# Patient Record
Sex: Female | Born: 1945
Health system: Southern US, Community
[De-identification: ages and names within clinical notes are randomized; demographics above are authoritative.]

## PROBLEM LIST (undated history)

## (undated) DIAGNOSIS — E7800 Pure hypercholesterolemia, unspecified: Secondary | ICD-10-CM

## (undated) DIAGNOSIS — M1990 Unspecified osteoarthritis, unspecified site: Secondary | ICD-10-CM

## (undated) DIAGNOSIS — M810 Age-related osteoporosis without current pathological fracture: Secondary | ICD-10-CM

---

## 2015-02-01 DIAGNOSIS — M898X9 Other specified disorders of bone, unspecified site: Secondary | ICD-10-CM | POA: Diagnosis not present

## 2015-04-04 DIAGNOSIS — H31011 Macula scars of posterior pole (postinflammatory) (post-traumatic), right eye: Secondary | ICD-10-CM | POA: Diagnosis not present

## 2015-04-04 DIAGNOSIS — H04129 Dry eye syndrome of unspecified lacrimal gland: Secondary | ICD-10-CM | POA: Diagnosis not present

## 2015-04-18 DIAGNOSIS — L309 Dermatitis, unspecified: Secondary | ICD-10-CM | POA: Diagnosis not present

## 2015-05-04 DIAGNOSIS — M1712 Unilateral primary osteoarthritis, left knee: Secondary | ICD-10-CM | POA: Diagnosis not present

## 2015-10-03 DIAGNOSIS — Z23 Encounter for immunization: Secondary | ICD-10-CM | POA: Diagnosis not present

## 2015-12-22 DIAGNOSIS — Z0000 Encounter for general adult medical examination without abnormal findings: Secondary | ICD-10-CM | POA: Diagnosis not present

## 2015-12-22 DIAGNOSIS — Z23 Encounter for immunization: Secondary | ICD-10-CM | POA: Diagnosis not present

## 2015-12-22 DIAGNOSIS — M179 Osteoarthritis of knee, unspecified: Secondary | ICD-10-CM | POA: Diagnosis not present

## 2015-12-22 DIAGNOSIS — M859 Disorder of bone density and structure, unspecified: Secondary | ICD-10-CM | POA: Diagnosis not present

## 2015-12-25 ENCOUNTER — Other Ambulatory Visit: Payer: Self-pay

## 2015-12-25 DIAGNOSIS — Z1231 Encounter for screening mammogram for malignant neoplasm of breast: Secondary | ICD-10-CM

## 2016-01-10 ENCOUNTER — Ambulatory Visit
Admission: RE | Admit: 2016-01-10 | Discharge: 2016-01-10 | Disposition: A | Discharge status: Home / Self Care | Payer: Medicare Other | Source: Ambulatory Visit

## 2016-01-10 DIAGNOSIS — Z1231 Encounter for screening mammogram for malignant neoplasm of breast: Secondary | ICD-10-CM | POA: Diagnosis not present

## 2016-01-10 IMAGING — MG MM SCREENING BREAST TOMO BILATERAL
6 of 10 series · 6 of 26 positions shown · non-contrast
Comparison: None.

CLINICAL DATA: Screening.

EXAM:
DIGITAL SCREENING BILATERAL MAMMOGRAM WITH 3D TOMO WITH CAD

[R MLO (1 of 2)]
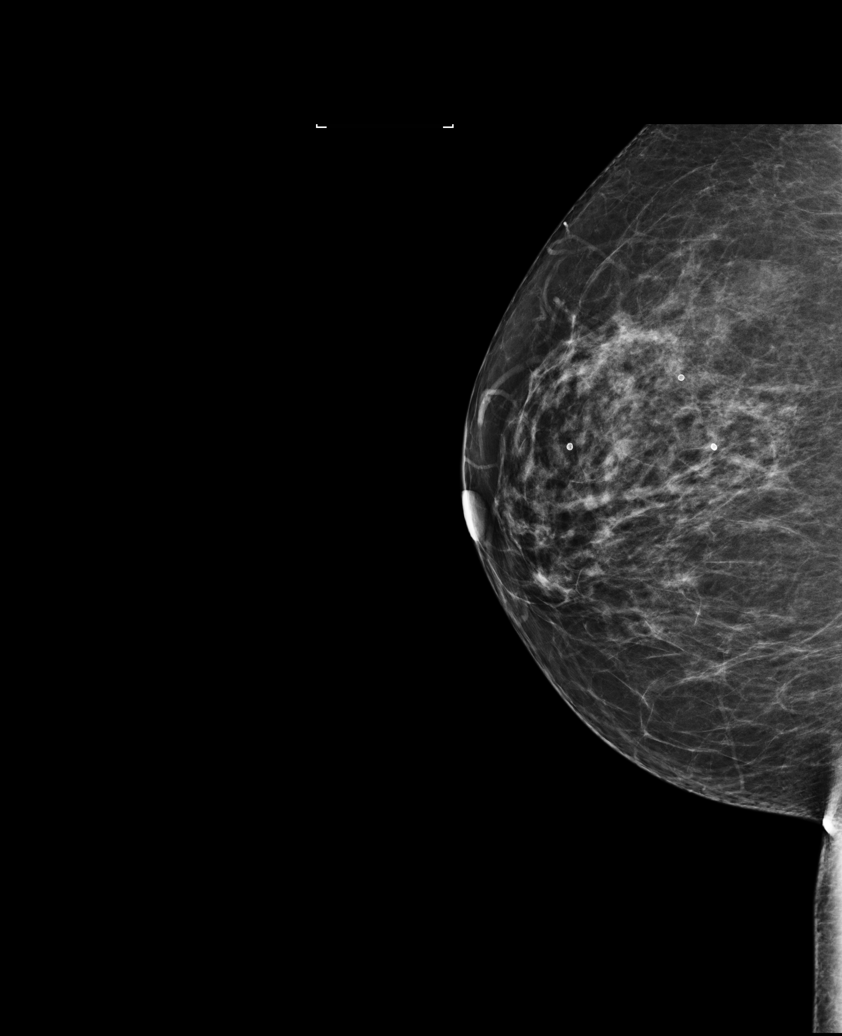

[L MLO (1 of 2)]
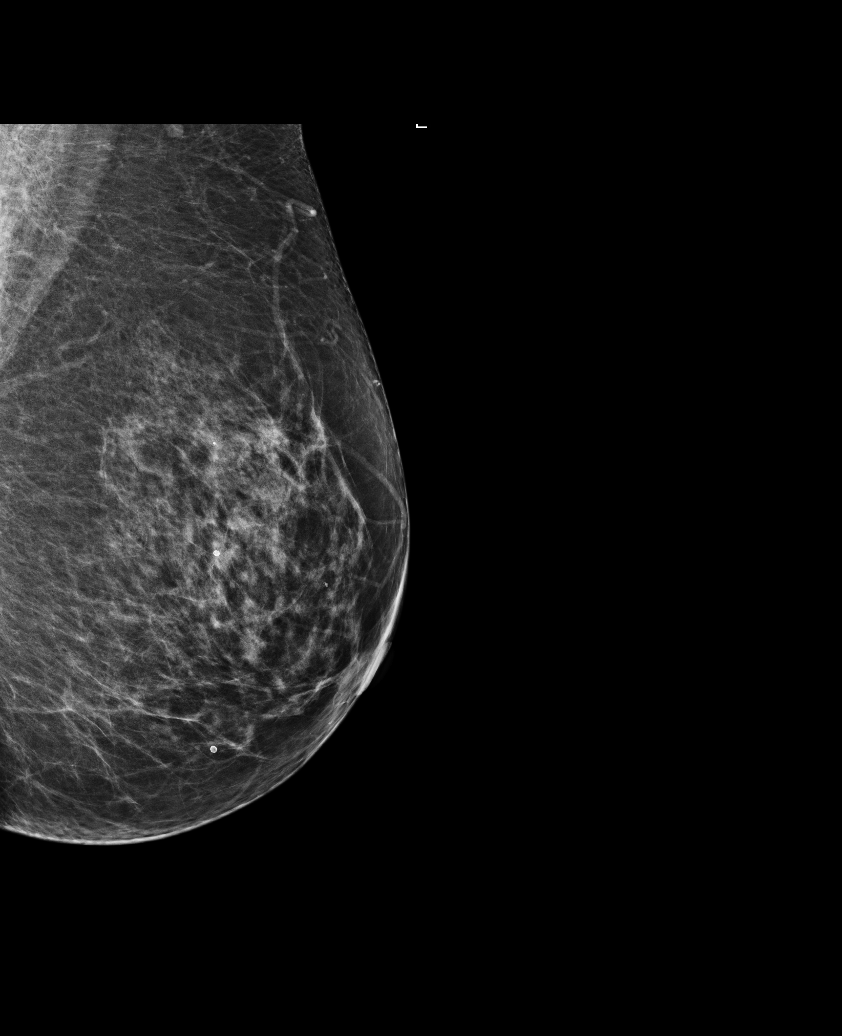

[R MLO (2 of 2)]
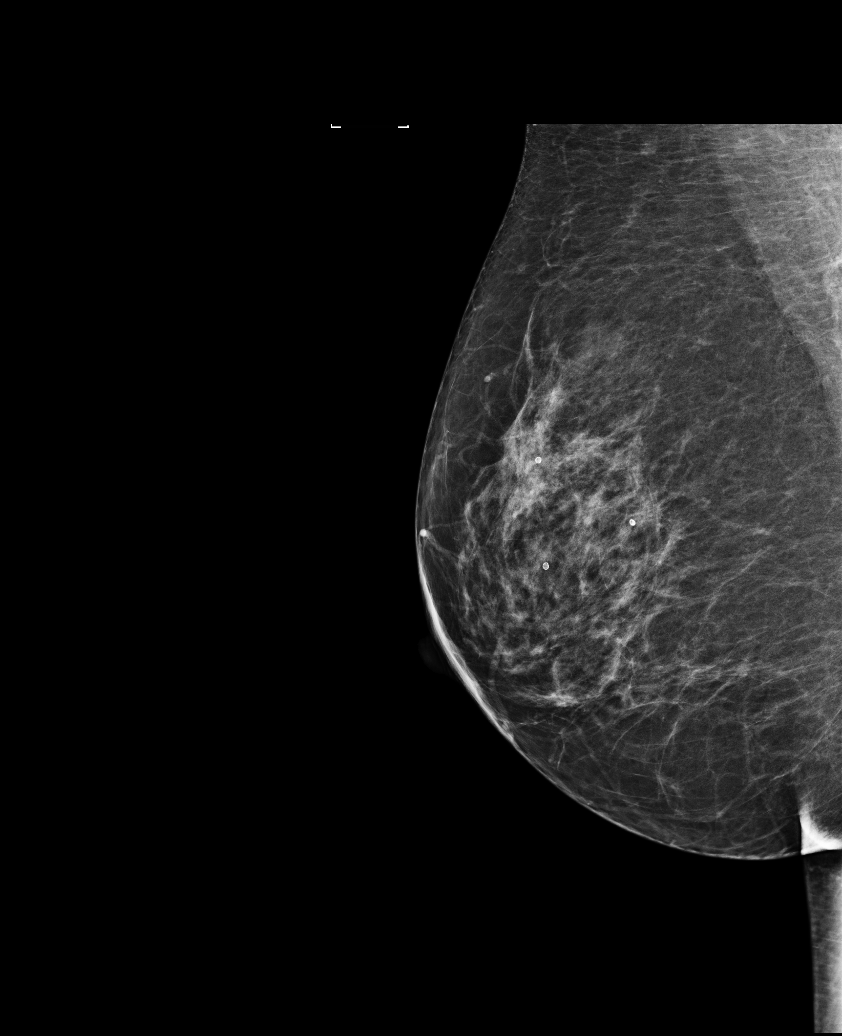

[R CC]
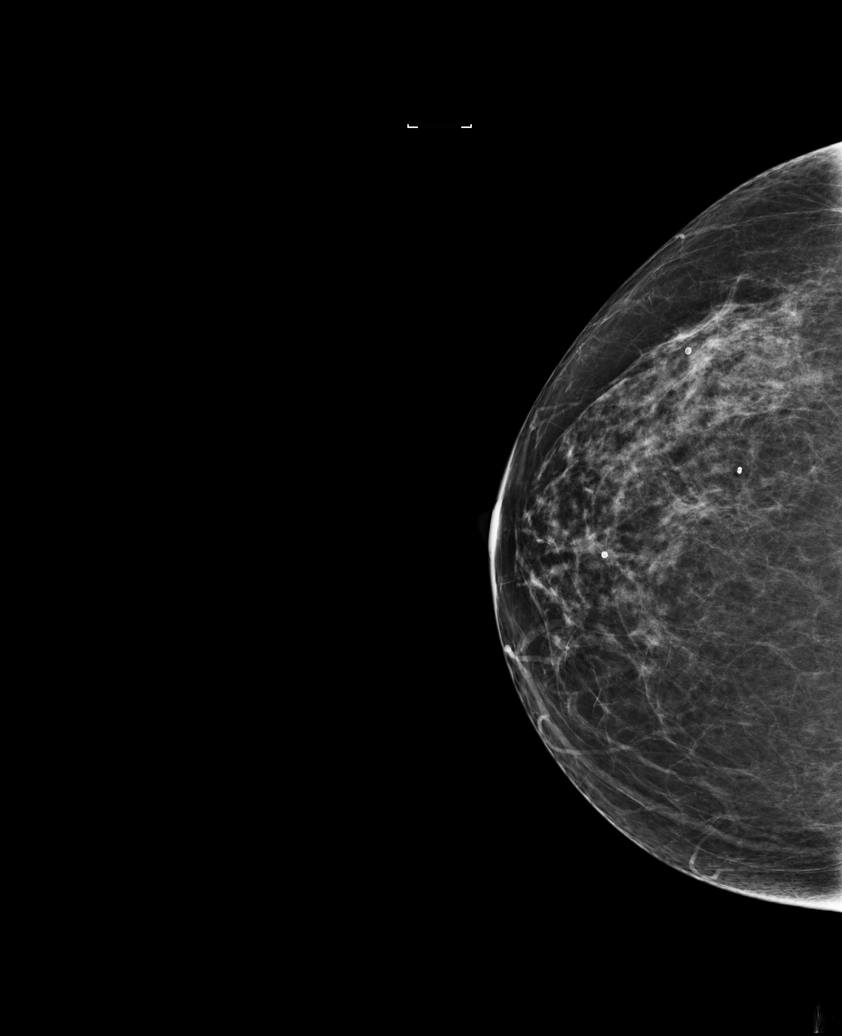

[L MLO (2 of 2)]
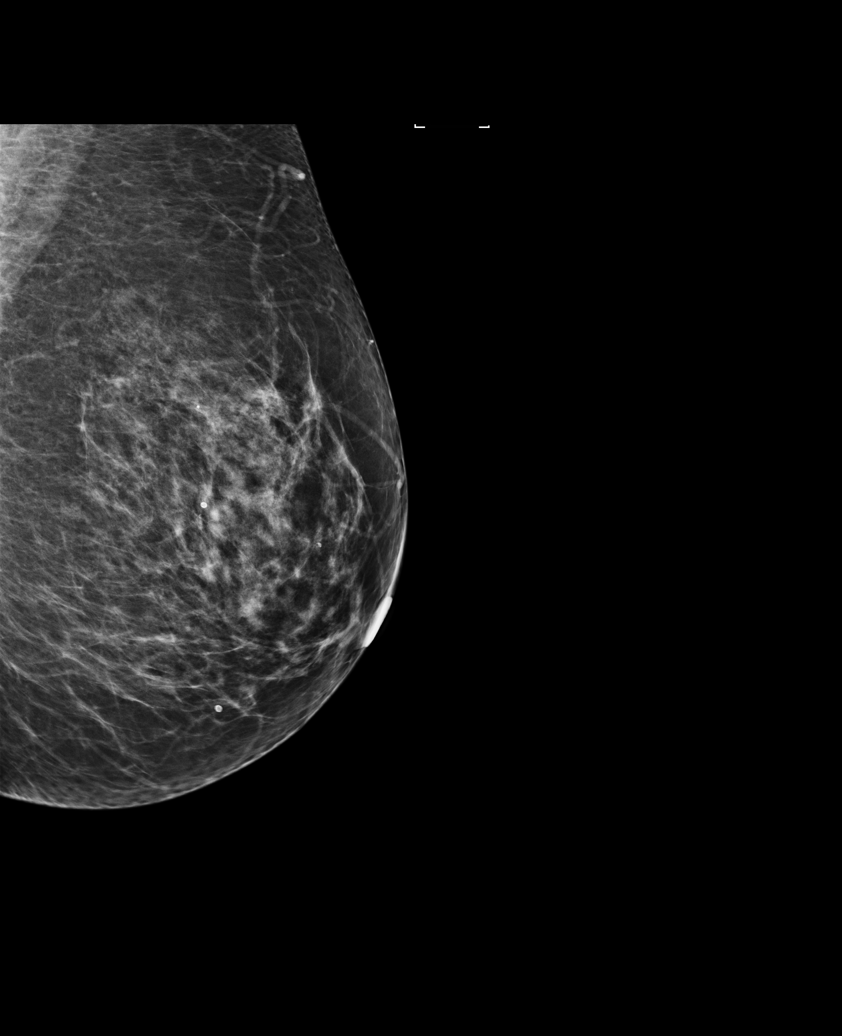

[L CC]
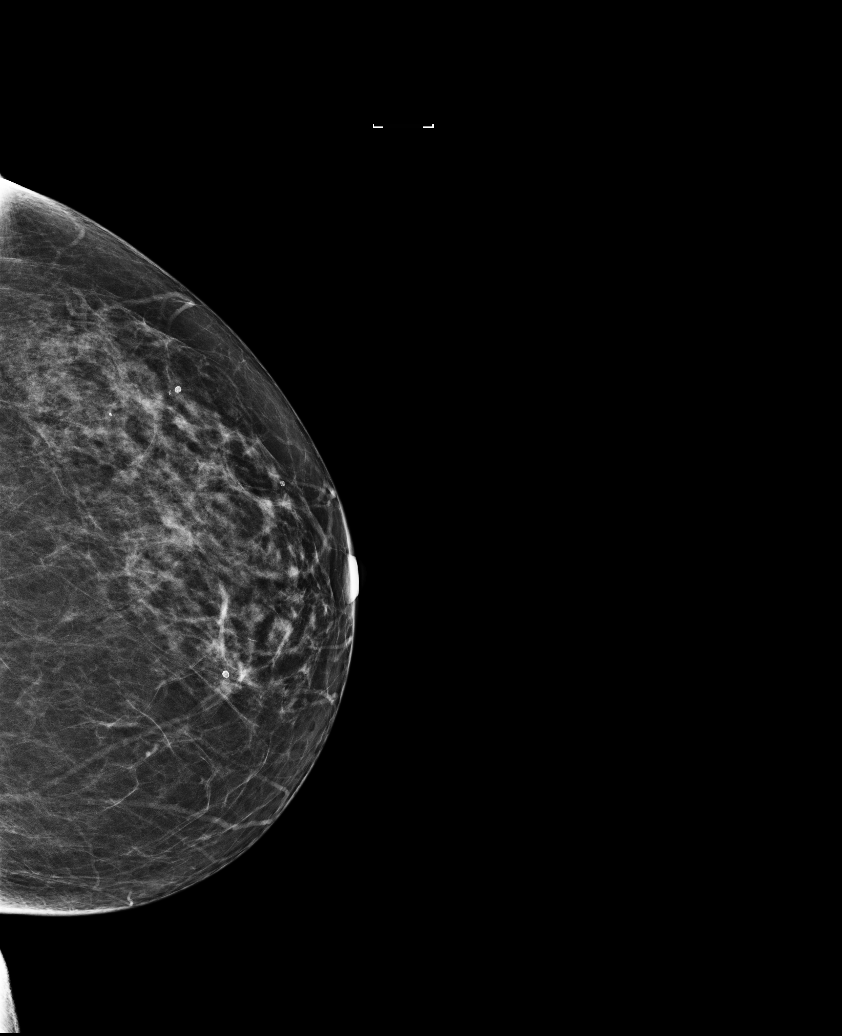

[6 of 26 positions shown; findings below may reference images not displayed]

ACR Breast Density Category b: There are scattered areas of
fibroglandular density.
FINDINGS: There are no findings suspicious for malignancy. Images were
processed with CAD.
IMPRESSION: No mammographic evidence of malignancy. A result letter of this
screening mammogram will be mailed directly to the patient.

RECOMMENDATION:
Screening mammogram in one year. (Code:[N7])

BI-RADS CATEGORY  1: Negative.

## 2016-02-08 DIAGNOSIS — H43819 Vitreous degeneration, unspecified eye: Secondary | ICD-10-CM | POA: Diagnosis not present

## 2016-08-16 DIAGNOSIS — M7062 Trochanteric bursitis, left hip: Secondary | ICD-10-CM | POA: Diagnosis not present

## 2016-08-16 DIAGNOSIS — R102 Pelvic and perineal pain: Secondary | ICD-10-CM | POA: Diagnosis not present

## 2016-08-16 DIAGNOSIS — M810 Age-related osteoporosis without current pathological fracture: Secondary | ICD-10-CM | POA: Diagnosis not present

## 2016-10-17 DIAGNOSIS — Z23 Encounter for immunization: Secondary | ICD-10-CM | POA: Diagnosis not present

## 2016-12-05 DIAGNOSIS — J069 Acute upper respiratory infection, unspecified: Secondary | ICD-10-CM | POA: Diagnosis not present

## 2016-12-05 DIAGNOSIS — R0982 Postnasal drip: Secondary | ICD-10-CM | POA: Diagnosis not present

## 2016-12-23 DIAGNOSIS — Z23 Encounter for immunization: Secondary | ICD-10-CM | POA: Diagnosis not present

## 2016-12-23 DIAGNOSIS — Z131 Encounter for screening for diabetes mellitus: Secondary | ICD-10-CM | POA: Diagnosis not present

## 2016-12-23 DIAGNOSIS — M1990 Unspecified osteoarthritis, unspecified site: Secondary | ICD-10-CM | POA: Diagnosis not present

## 2016-12-23 DIAGNOSIS — M810 Age-related osteoporosis without current pathological fracture: Secondary | ICD-10-CM | POA: Diagnosis not present

## 2016-12-23 DIAGNOSIS — Z0000 Encounter for general adult medical examination without abnormal findings: Secondary | ICD-10-CM | POA: Diagnosis not present

## 2016-12-23 DIAGNOSIS — Z13220 Encounter for screening for lipoid disorders: Secondary | ICD-10-CM | POA: Diagnosis not present

## 2016-12-23 DIAGNOSIS — Z1159 Encounter for screening for other viral diseases: Secondary | ICD-10-CM | POA: Diagnosis not present

## 2017-01-23 ENCOUNTER — Other Ambulatory Visit: Payer: Self-pay | Admitting: Family Medicine

## 2017-01-23 DIAGNOSIS — Z1231 Encounter for screening mammogram for malignant neoplasm of breast: Secondary | ICD-10-CM

## 2017-02-11 ENCOUNTER — Ambulatory Visit: Payer: Medicare Other

## 2017-02-28 ENCOUNTER — Ambulatory Visit
Admission: RE | Admit: 2017-02-28 | Discharge: 2017-02-28 | Disposition: A | Discharge status: Home / Self Care | Payer: Medicare Other | Source: Ambulatory Visit | Attending: Family Medicine | Admitting: Family Medicine

## 2017-02-28 DIAGNOSIS — Z1231 Encounter for screening mammogram for malignant neoplasm of breast: Secondary | ICD-10-CM | POA: Diagnosis not present

## 2017-02-28 IMAGING — MG 2D DIGITAL SCREENING BILATERAL MAMMOGRAM WITH CAD AND ADJUNCT TO
8 of 13 series · 8 of 29 positions shown · non-contrast
Comparison: Previous exam(s).

CLINICAL DATA: Screening.

EXAM:
2D DIGITAL SCREENING BILATERAL MAMMOGRAM WITH CAD AND ADJUNCT TOMO

[L MLO (1 of 2)]
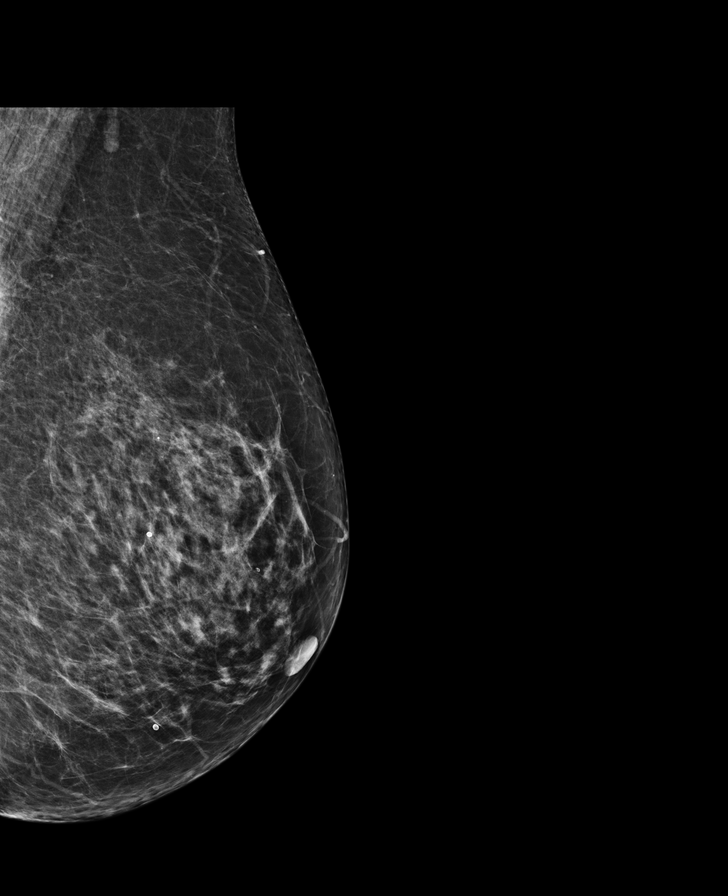

[R CC synth-2D]
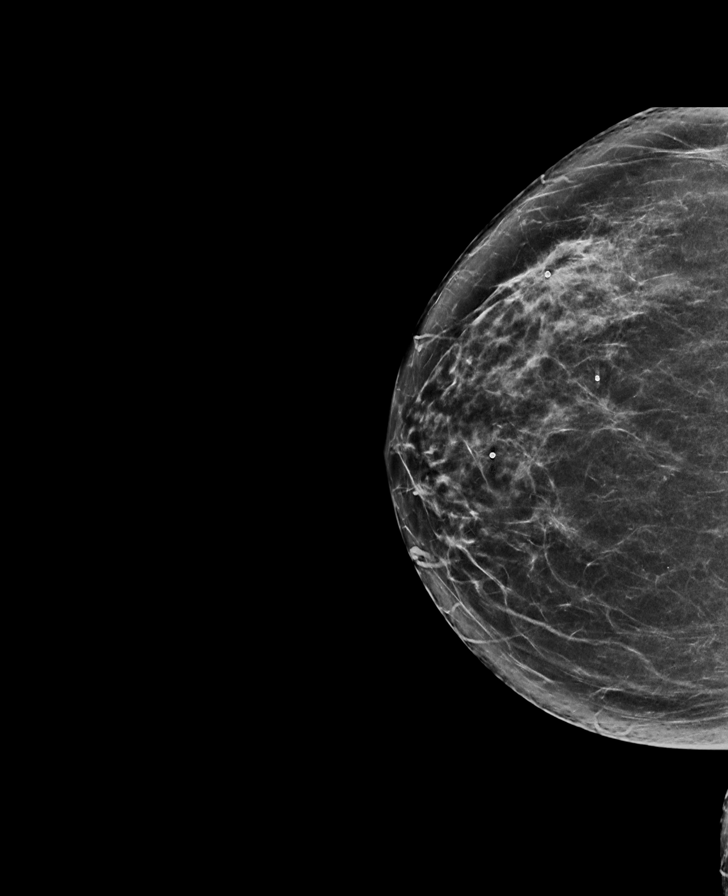

[R CC]
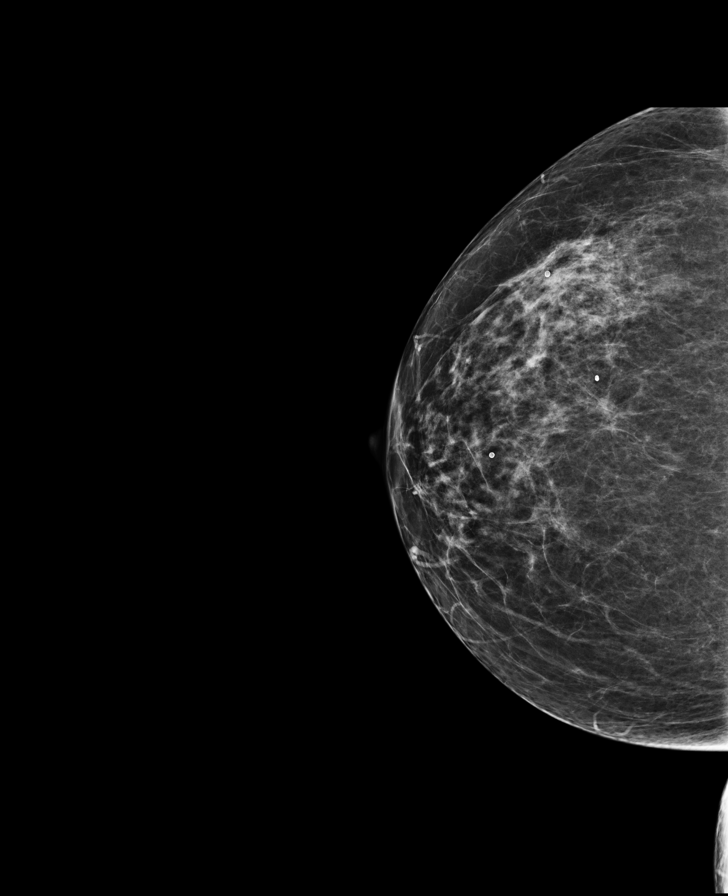

[R MLO synth-2D]
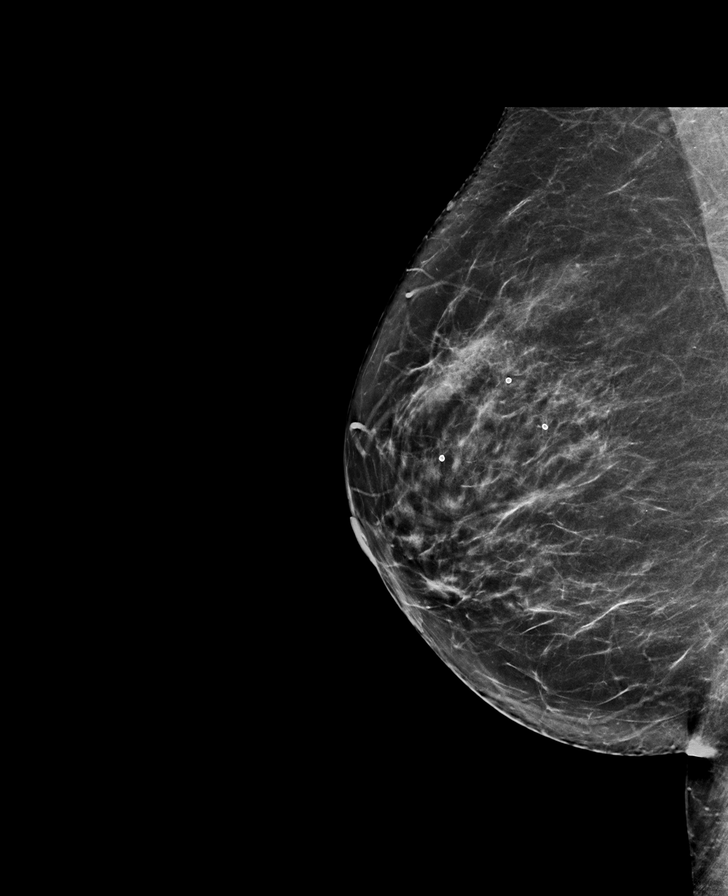

[L MLO synth-2D]
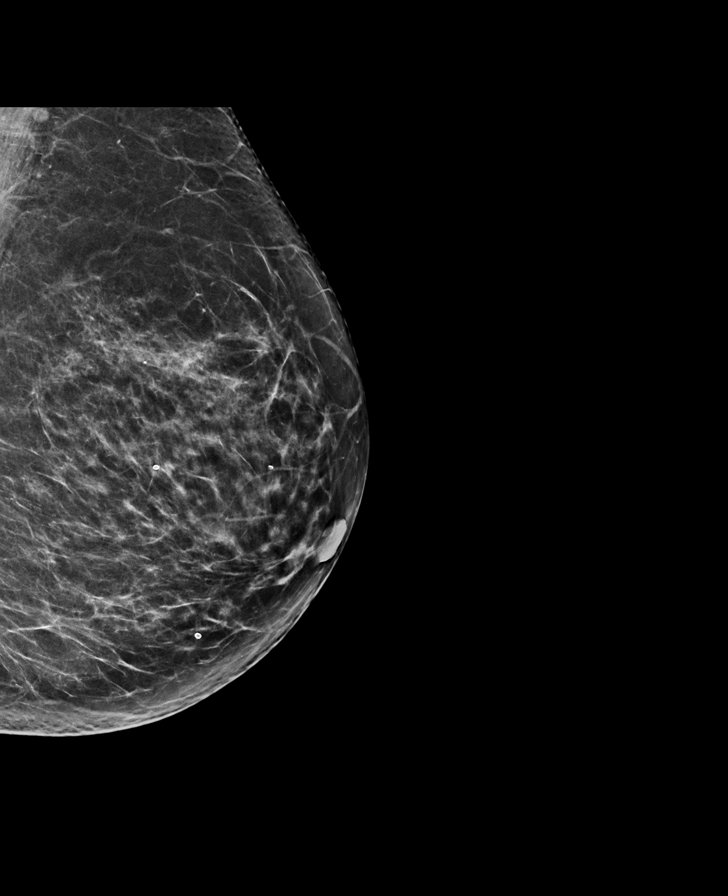

[R MLO]
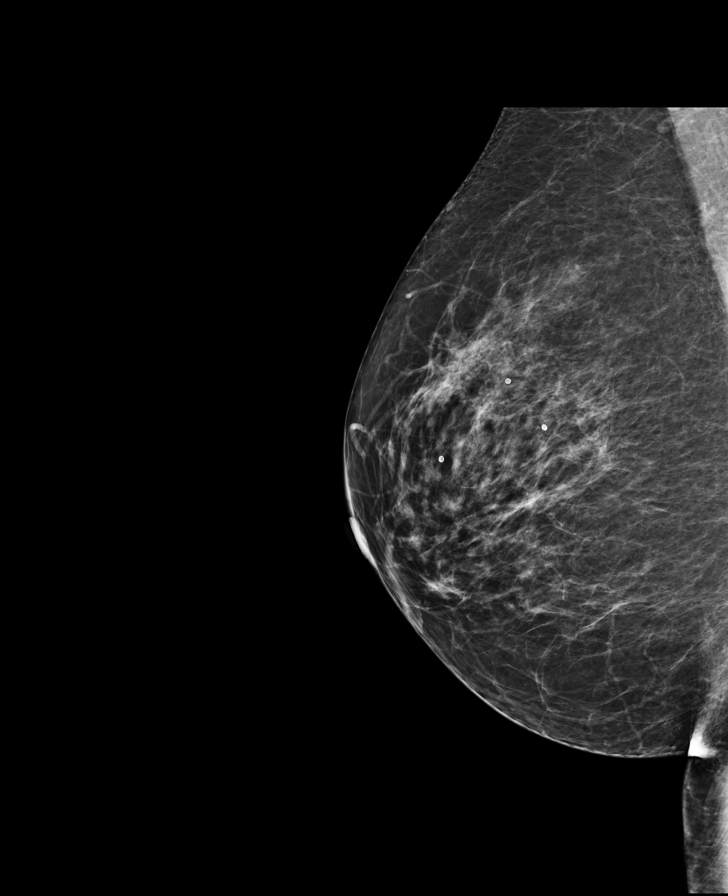

[L CC synth-2D]
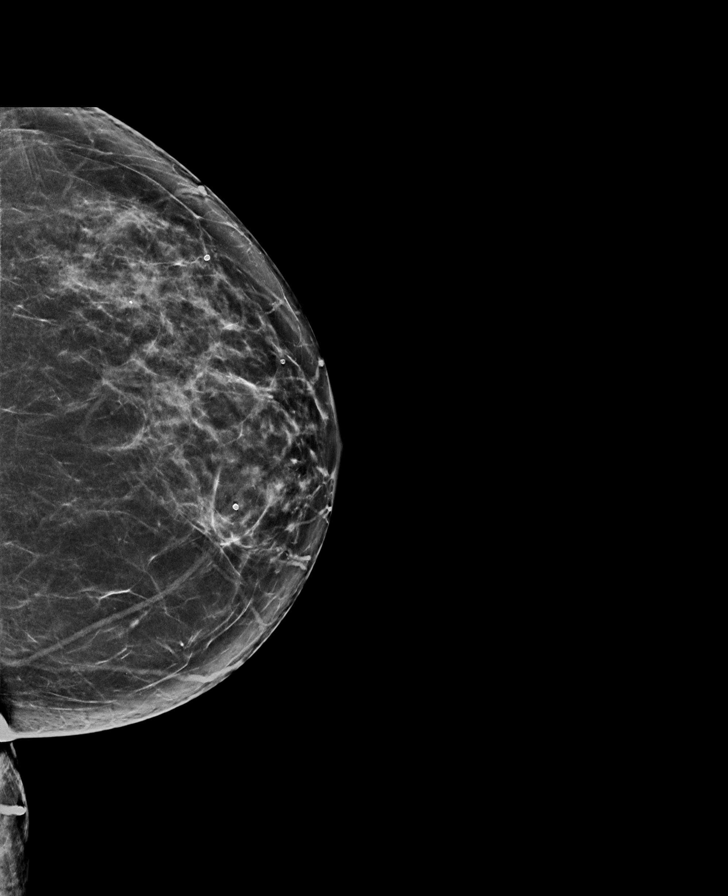

[L MLO (2 of 2)]
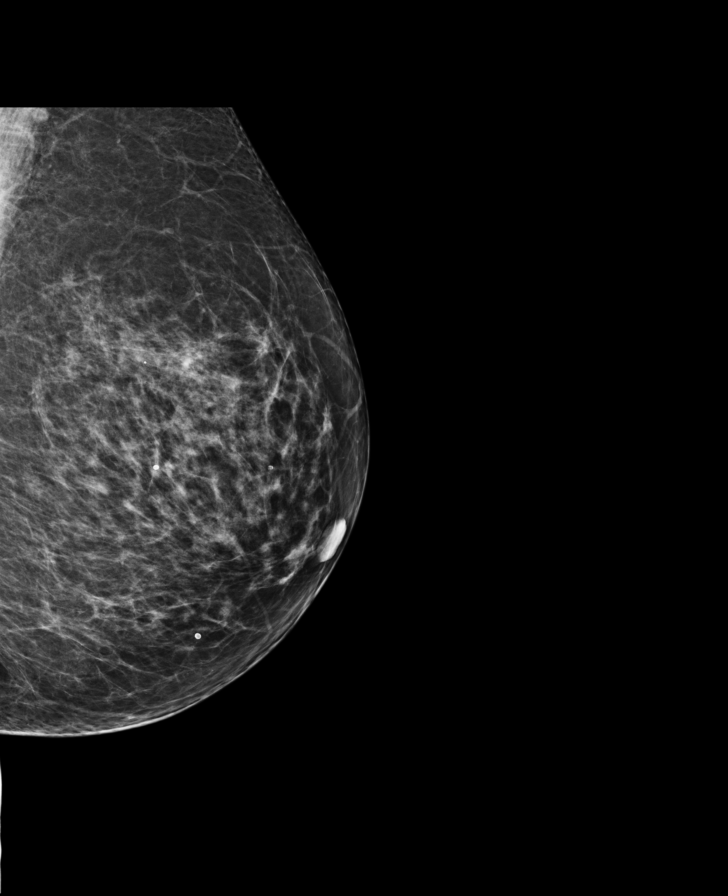

[8 of 29 positions shown; findings below may reference images not displayed]

ACR Breast Density Category b: There are scattered areas of
fibroglandular density.
FINDINGS: There are no findings suspicious for malignancy. Images were
processed with CAD.
IMPRESSION: No mammographic evidence of malignancy. A result letter of this
screening mammogram will be mailed directly to the patient.

RECOMMENDATION:
Screening mammogram in one year. (Code:[33])

BI-RADS CATEGORY  1: Negative.

## 2017-04-21 DIAGNOSIS — H04123 Dry eye syndrome of bilateral lacrimal glands: Secondary | ICD-10-CM | POA: Diagnosis not present

## 2017-04-21 DIAGNOSIS — H43819 Vitreous degeneration, unspecified eye: Secondary | ICD-10-CM | POA: Diagnosis not present

## 2017-10-20 DIAGNOSIS — Z23 Encounter for immunization: Secondary | ICD-10-CM | POA: Diagnosis not present

## 2017-12-26 DIAGNOSIS — M859 Disorder of bone density and structure, unspecified: Secondary | ICD-10-CM | POA: Diagnosis not present

## 2017-12-26 DIAGNOSIS — E7800 Pure hypercholesterolemia, unspecified: Secondary | ICD-10-CM | POA: Diagnosis not present

## 2017-12-26 DIAGNOSIS — Z1389 Encounter for screening for other disorder: Secondary | ICD-10-CM | POA: Diagnosis not present

## 2017-12-26 DIAGNOSIS — M179 Osteoarthritis of knee, unspecified: Secondary | ICD-10-CM | POA: Diagnosis not present

## 2017-12-26 DIAGNOSIS — Z1211 Encounter for screening for malignant neoplasm of colon: Secondary | ICD-10-CM | POA: Diagnosis not present

## 2017-12-26 DIAGNOSIS — Z0000 Encounter for general adult medical examination without abnormal findings: Secondary | ICD-10-CM | POA: Diagnosis not present

## 2017-12-26 DIAGNOSIS — E663 Overweight: Secondary | ICD-10-CM | POA: Diagnosis not present

## 2017-12-26 DIAGNOSIS — Z131 Encounter for screening for diabetes mellitus: Secondary | ICD-10-CM | POA: Diagnosis not present

## 2017-12-26 DIAGNOSIS — R739 Hyperglycemia, unspecified: Secondary | ICD-10-CM | POA: Diagnosis not present

## 2017-12-29 DIAGNOSIS — Z1211 Encounter for screening for malignant neoplasm of colon: Secondary | ICD-10-CM | POA: Diagnosis not present

## 2018-02-10 ENCOUNTER — Other Ambulatory Visit: Payer: Self-pay | Admitting: Family Medicine

## 2018-02-10 DIAGNOSIS — Z1231 Encounter for screening mammogram for malignant neoplasm of breast: Secondary | ICD-10-CM

## 2018-02-24 ENCOUNTER — Other Ambulatory Visit (HOSPITAL_BASED_OUTPATIENT_CLINIC_OR_DEPARTMENT_OTHER): Payer: Self-pay | Admitting: Family Medicine

## 2018-02-24 DIAGNOSIS — Z1231 Encounter for screening mammogram for malignant neoplasm of breast: Secondary | ICD-10-CM

## 2018-03-04 ENCOUNTER — Ambulatory Visit: Payer: Medicare Other

## 2018-03-04 ENCOUNTER — Ambulatory Visit (HOSPITAL_BASED_OUTPATIENT_CLINIC_OR_DEPARTMENT_OTHER)
Admission: RE | Admit: 2018-03-04 | Discharge: 2018-03-04 | Disposition: A | Discharge status: Home / Self Care | Payer: Medicare Other | Source: Ambulatory Visit | Attending: Family Medicine | Admitting: Family Medicine

## 2018-03-04 DIAGNOSIS — Z1231 Encounter for screening mammogram for malignant neoplasm of breast: Secondary | ICD-10-CM | POA: Insufficient documentation

## 2018-03-04 IMAGING — MG DIGITAL SCREENING BILATERAL MAMMOGRAM WITH TOMO AND CAD
8 series · 8 of 24 positions shown · non-contrast
Comparison: Previous exam(s).

CLINICAL DATA: Screening.

EXAM:
DIGITAL SCREENING BILATERAL MAMMOGRAM WITH TOMO AND CAD

[R MLO synth-2D]
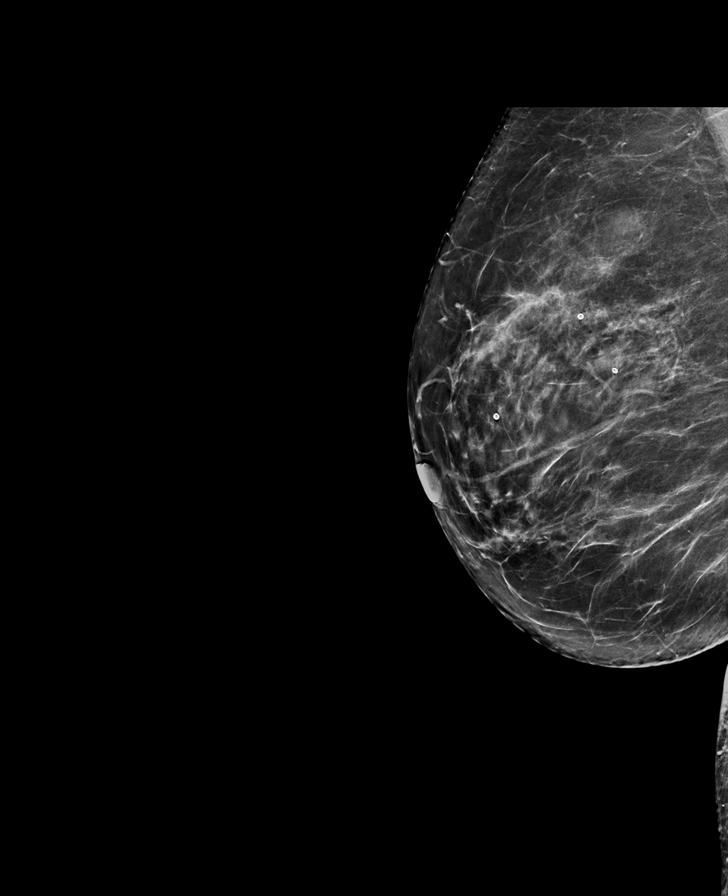

[L MLO synth-2D]
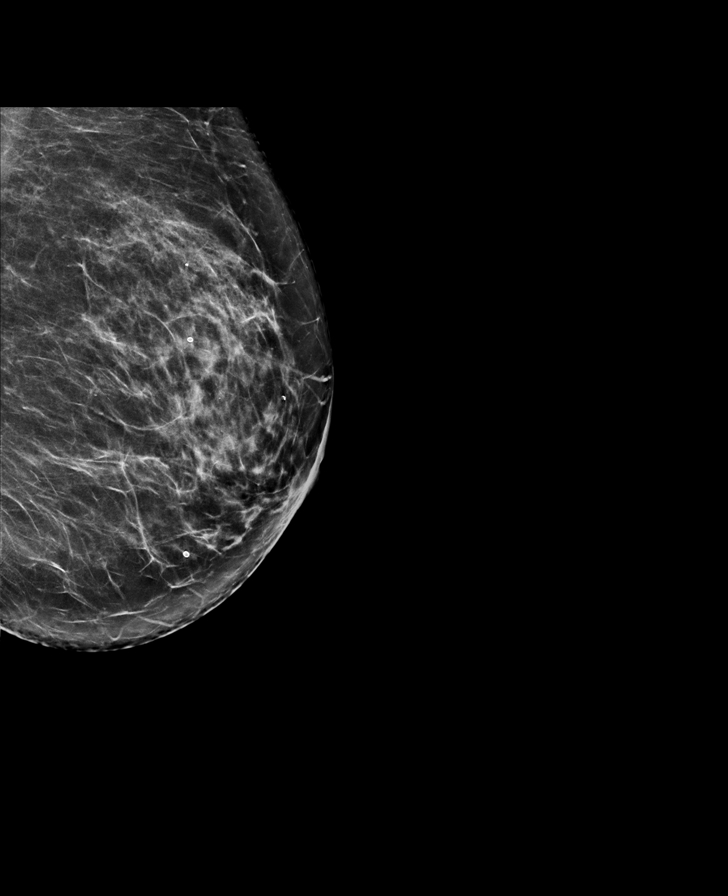

[L CC synth-2D]
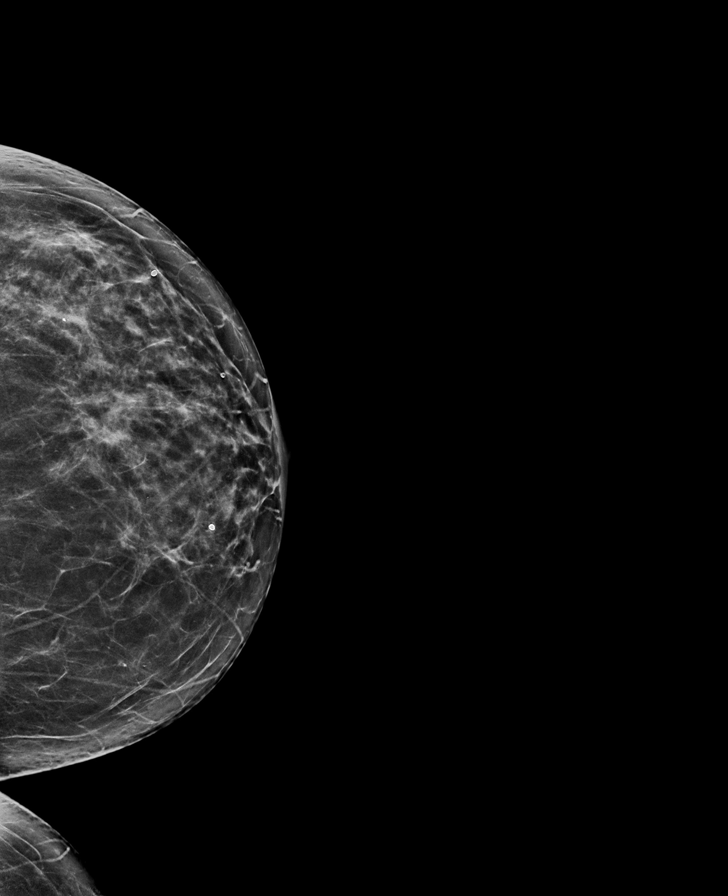

[R CC synth-2D]
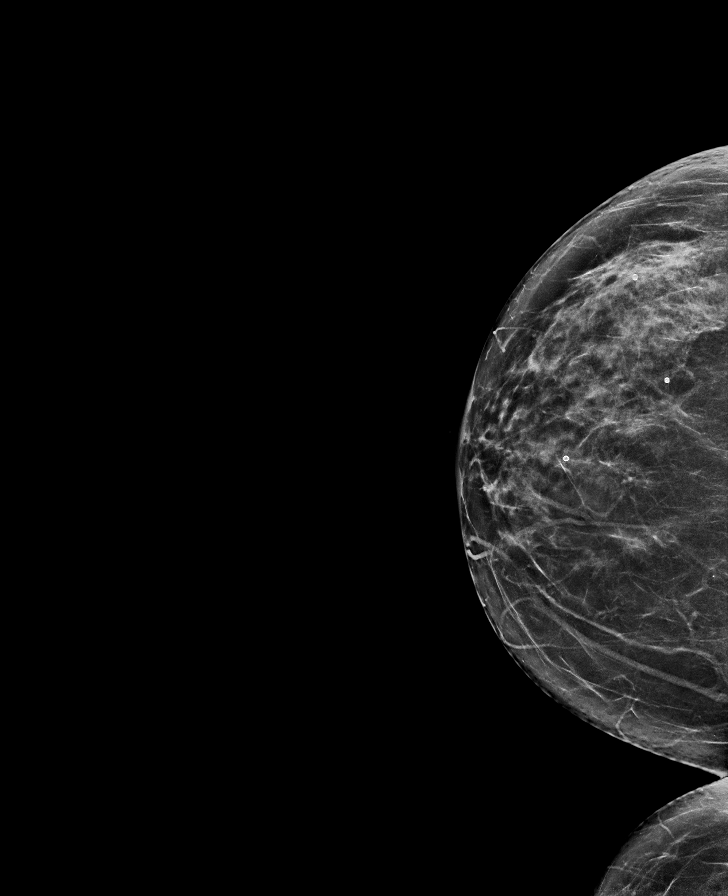

[R CC tomo · tomo slice 34/67.0]
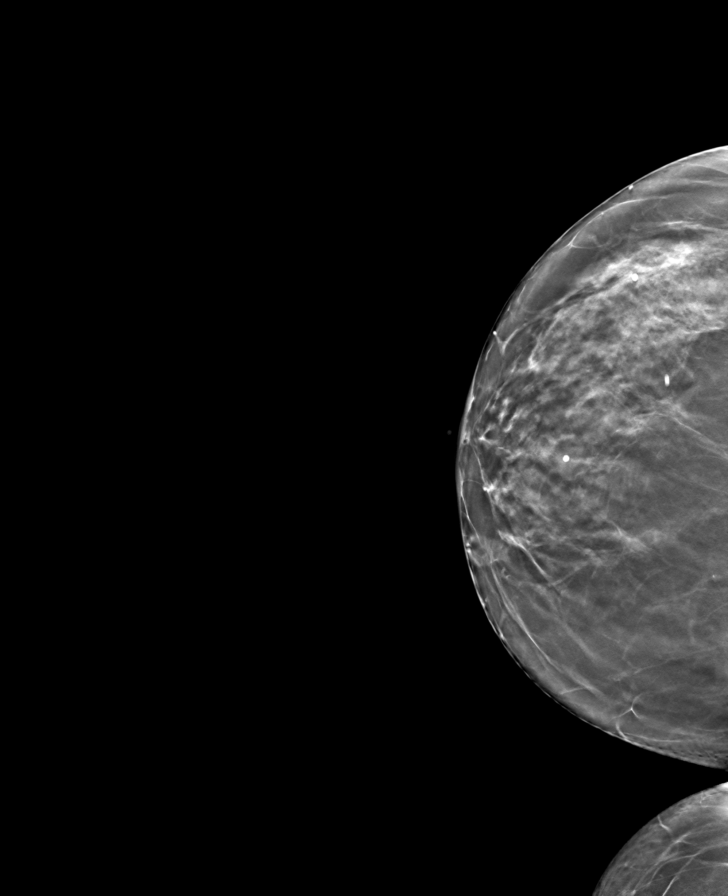

[R MLO tomo · tomo slice 38/75.0]
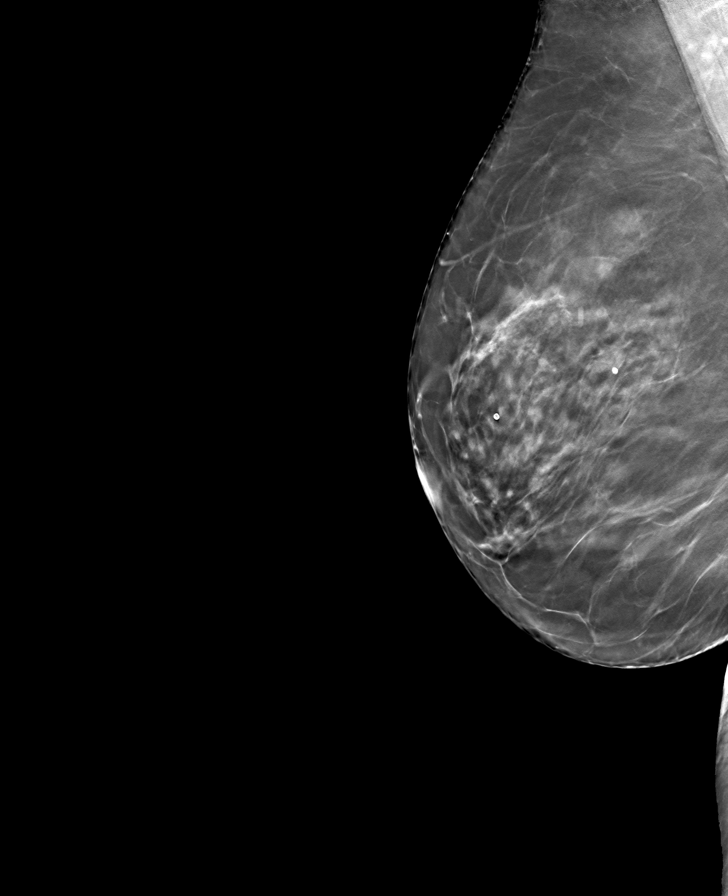

[L MLO tomo · tomo slice 38/75.0]
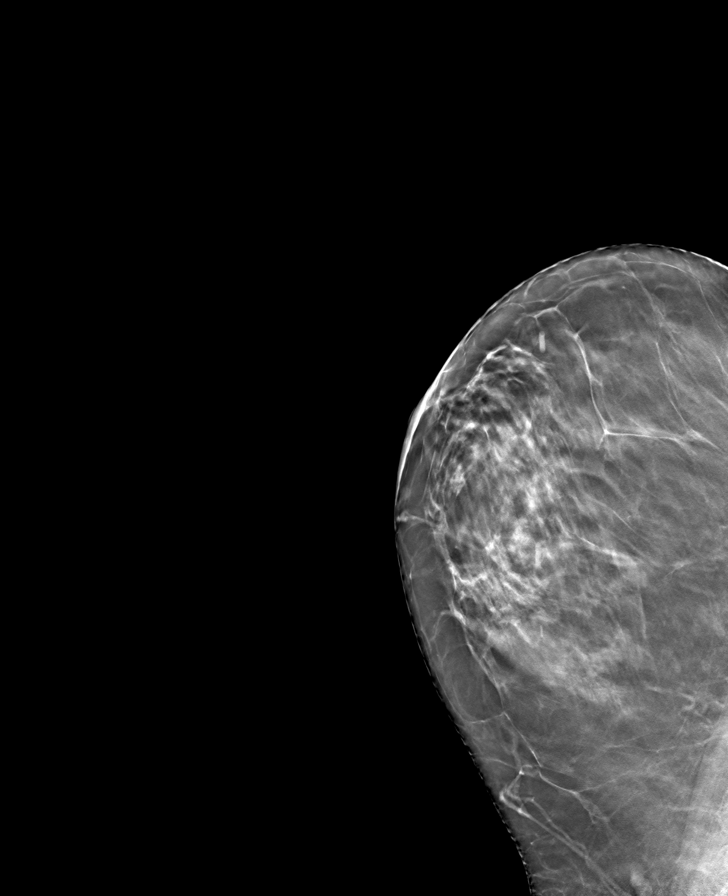

[L CC tomo · tomo slice 33/66.0]
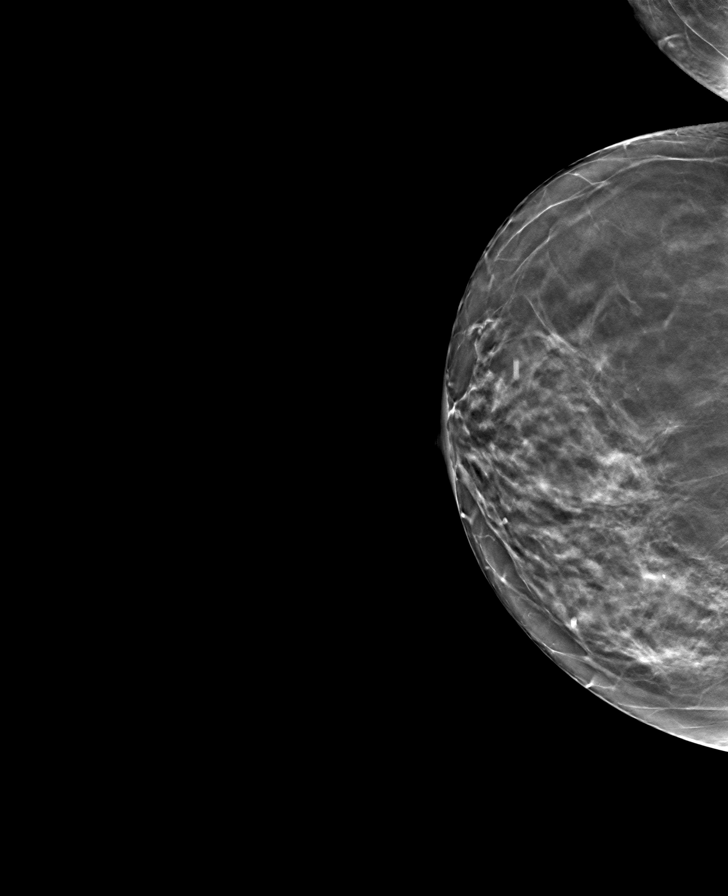

[8 of 24 positions shown; findings below may reference images not displayed]

ACR Breast Density Category b: There are scattered areas of
fibroglandular density.
FINDINGS: There are no findings suspicious for malignancy. Images were
processed with CAD.
IMPRESSION: No mammographic evidence of malignancy. A result letter of this
screening mammogram will be mailed directly to the patient.

RECOMMENDATION:
Screening mammogram in one year. (Code:[TQ])

BI-RADS CATEGORY  1: Negative.

## 2018-03-26 DIAGNOSIS — E7800 Pure hypercholesterolemia, unspecified: Secondary | ICD-10-CM | POA: Diagnosis not present

## 2018-04-30 DIAGNOSIS — H04123 Dry eye syndrome of bilateral lacrimal glands: Secondary | ICD-10-CM | POA: Diagnosis not present

## 2018-04-30 DIAGNOSIS — H43819 Vitreous degeneration, unspecified eye: Secondary | ICD-10-CM | POA: Diagnosis not present

## 2018-07-22 DIAGNOSIS — M1711 Unilateral primary osteoarthritis, right knee: Secondary | ICD-10-CM | POA: Diagnosis not present

## 2018-07-22 DIAGNOSIS — M1712 Unilateral primary osteoarthritis, left knee: Secondary | ICD-10-CM | POA: Diagnosis not present

## 2018-10-12 DIAGNOSIS — Z23 Encounter for immunization: Secondary | ICD-10-CM | POA: Diagnosis not present

## 2018-12-15 ENCOUNTER — Other Ambulatory Visit: Payer: Self-pay | Admitting: Orthopedic Surgery

## 2018-12-21 ENCOUNTER — Ambulatory Visit (HOSPITAL_BASED_OUTPATIENT_CLINIC_OR_DEPARTMENT_OTHER)
Admission: RE | Admit: 2018-12-21 | Discharge: 2018-12-21 | Disposition: A | Discharge status: Home / Self Care | Payer: Medicare Other | Source: Ambulatory Visit | Attending: Family Medicine | Admitting: Family Medicine

## 2018-12-21 ENCOUNTER — Other Ambulatory Visit (HOSPITAL_BASED_OUTPATIENT_CLINIC_OR_DEPARTMENT_OTHER): Payer: Self-pay | Admitting: Family Medicine

## 2018-12-21 DIAGNOSIS — R519 Headache, unspecified: Secondary | ICD-10-CM

## 2018-12-21 DIAGNOSIS — R51 Headache: Principal | ICD-10-CM

## 2018-12-21 IMAGING — CT CT MAXILLOFACIAL W/O CM
4 of 6 series · 16 of 47 positions shown, 18 images · non-contrast
Comparison: None.

CLINICAL DATA: Intractable headache and facial pain with numbness 2
weeks. No injury.

EXAM:
CT HEAD WITHOUT CONTRAST
CT MAXILLOFACIAL WITHOUT CONTRAST
TECHNIQUE: Multidetector CT imaging of the head and maxillofacial structures
were performed using the standard protocol without intravenous
contrast. Multiplanar CT image reconstructions of the maxillofacial
structures were also generated.

[Series 2: head wo · axial · 0.41mm/px · z∈[-169,-64]mm · 6 of 31 slices shown, 8 images]
[im 5/31  brain]
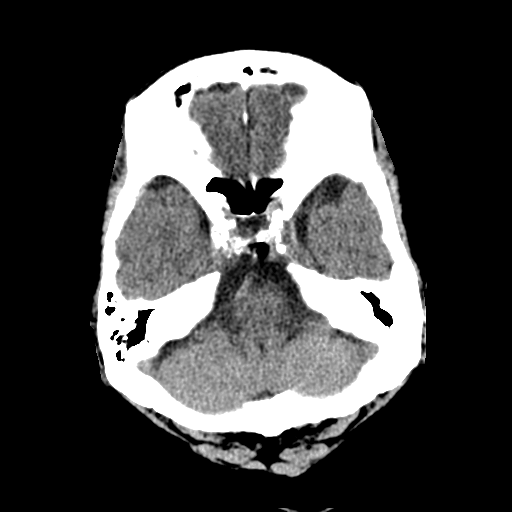
[im 5/31  bone]
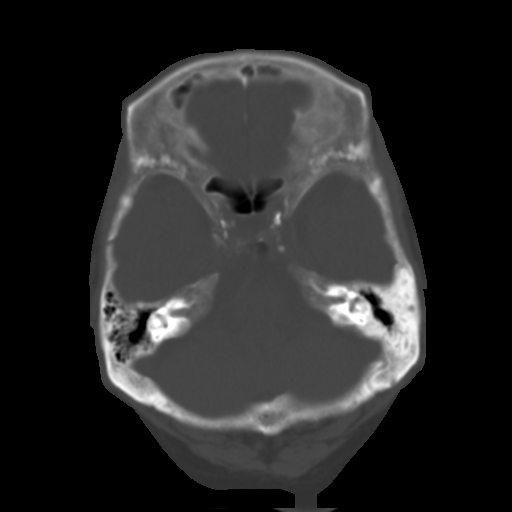
[im 9/31  bone]
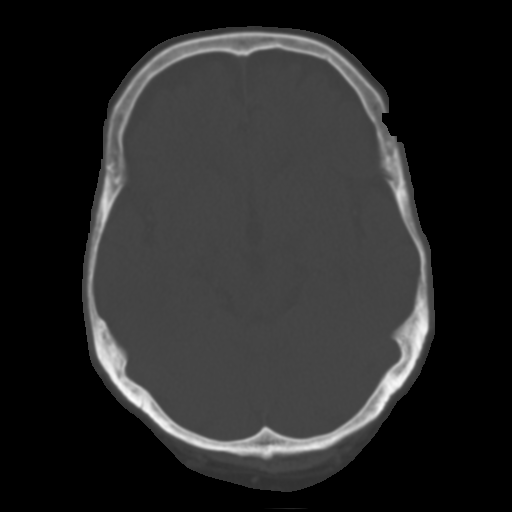
[im 13/31  bone]
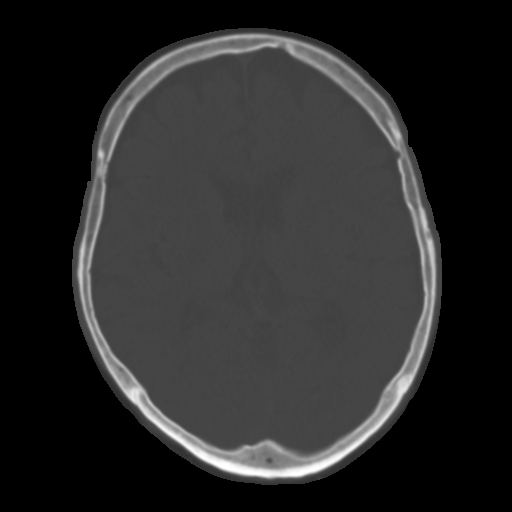
[im 18/31  bone]
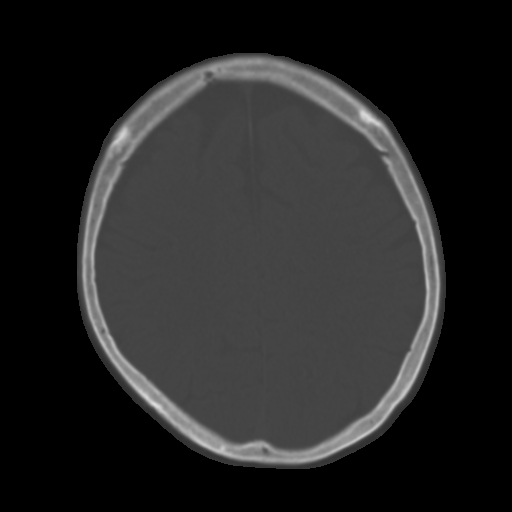
[im 22/31  brain]
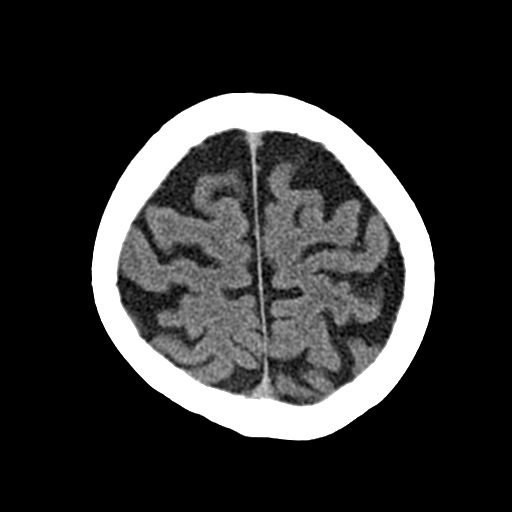
[im 22/31  bone]
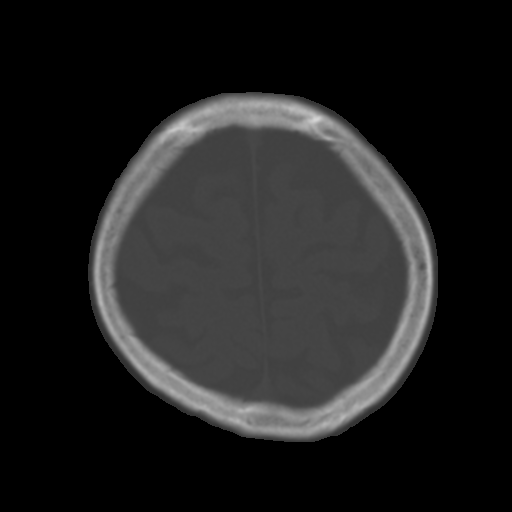
[im 26/31  bone]
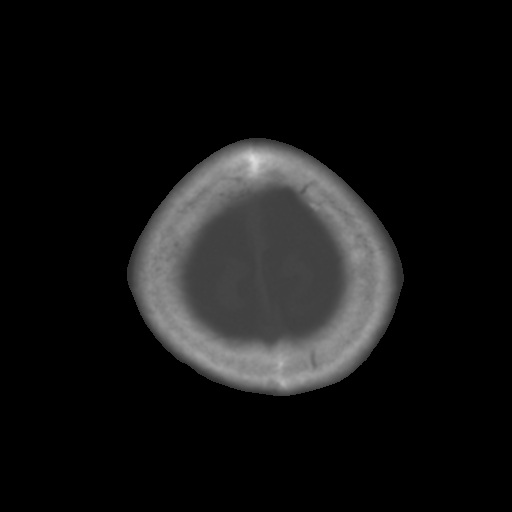

[Series 4: cor head wo · coronal · 0.31mm/px · 3 of 67 slices shown]
[im 17/67  bone]
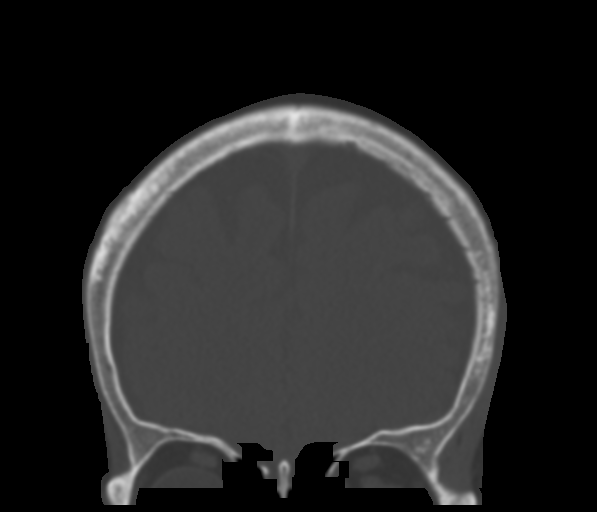
[im 34/67  bone]
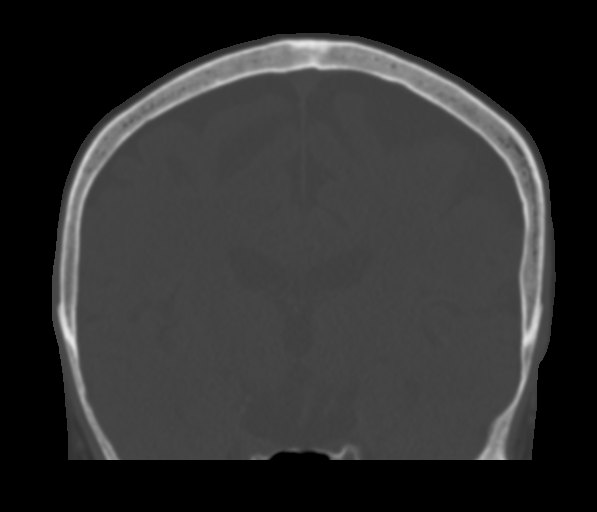
[im 50/67  bone]
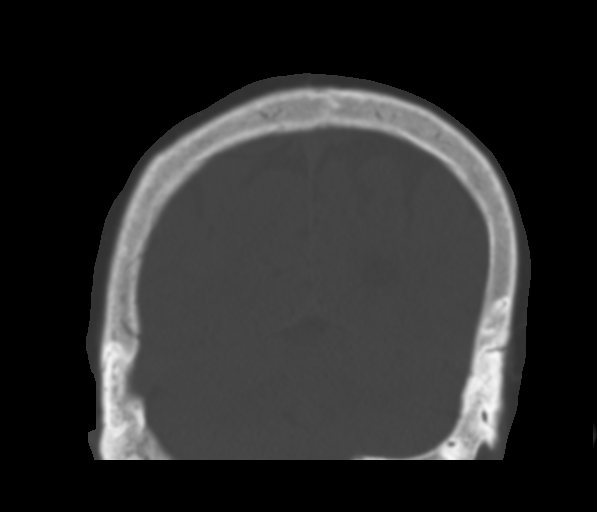

[Series 5: sag head wo · sagittal · 0.32mm/px · 1 of 55 slices shown]
[im 28/55  bone]
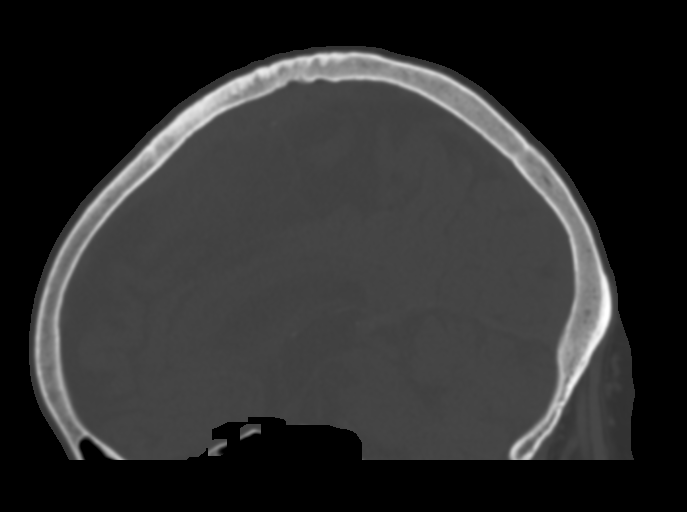

[Series 6: max soft · axial · 0.36mm/px · z∈[-306,-210]mm · 6 of 86 slices shown]
[im 9/86  brain]
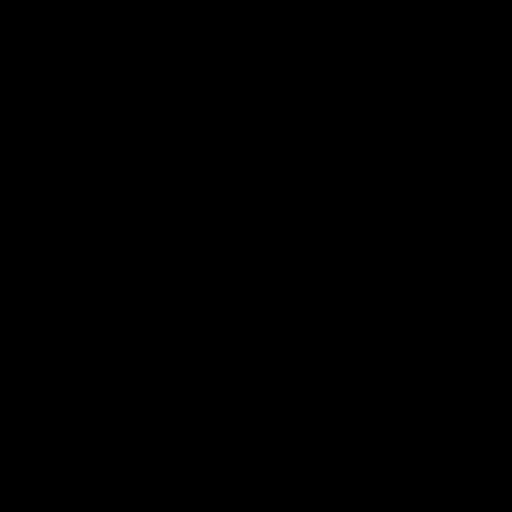
[im 17/86  brain]
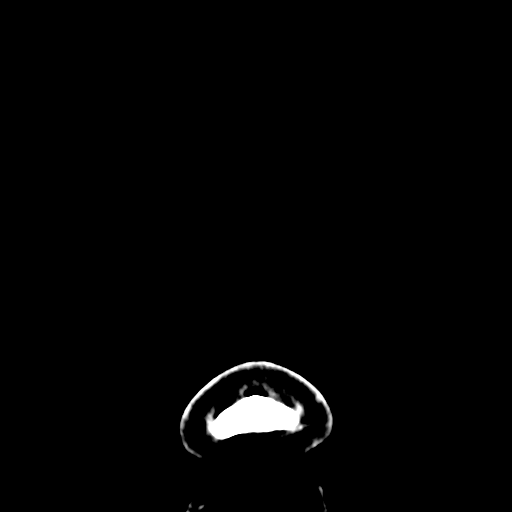
[im 29/86  brain]
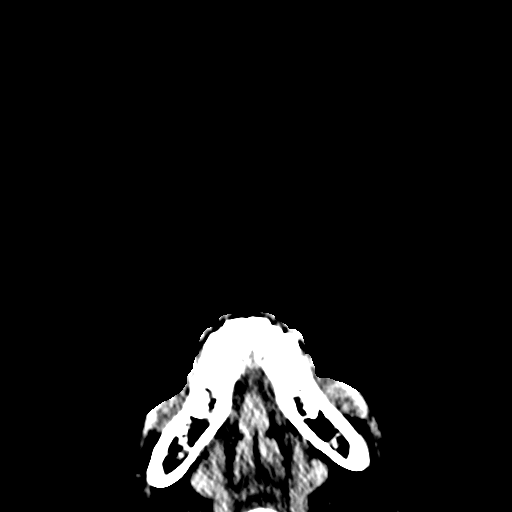
[im 37/86  brain]
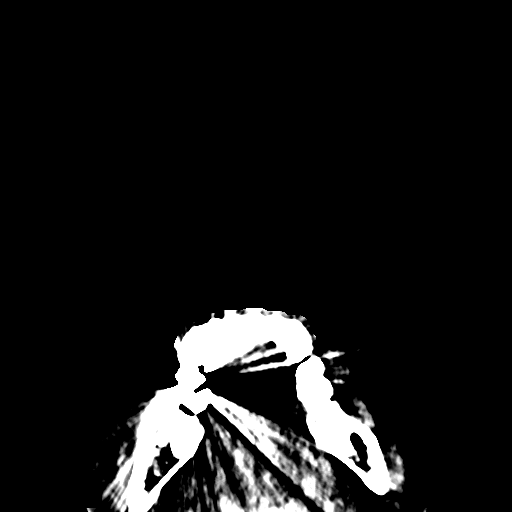
[im 49/86  brain]
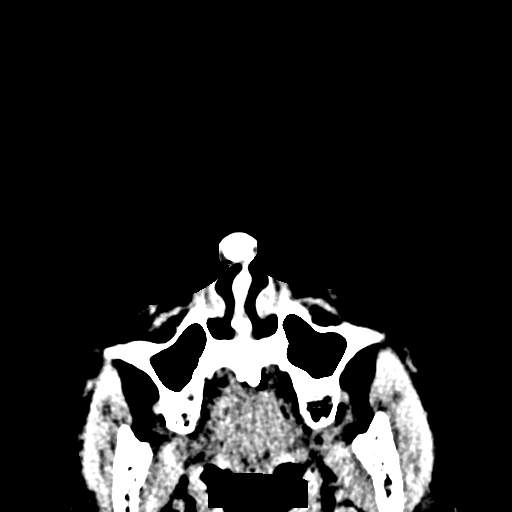
[im 57/86  brain]
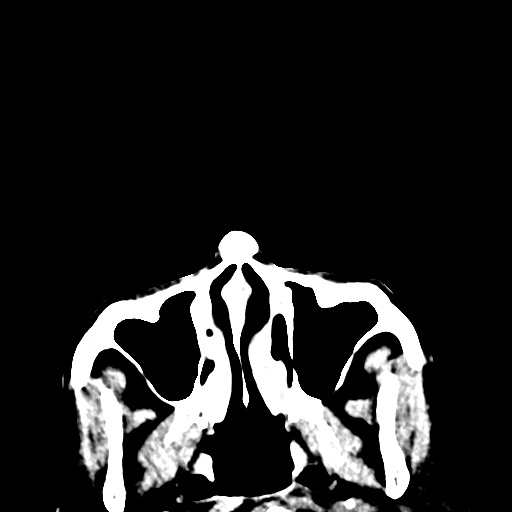

[16 of 47 positions shown; findings below may reference images not displayed]

FINDINGS: CT HEAD FINDINGS

Brain: Ventricles, cisterns and other CSF spaces are within normal.
There is no mass, mass effect, shift of midline structures or acute
hemorrhage. No evidence of acute infarction.

Vascular: No hyperdense vessel or unexpected calcification.

Skull: Normal. Negative for fracture or focal lesion.

Other: None

CT MAXILLOFACIAL FINDINGS

Osseous: No evidence of facial bone fracture or focal bony
abnormality.

Orbits: Normal.

Sinuses: Paranasal sinuses are clear. Subtle deviation of the nasal
septum to the right. Ostiomeatal complexes are clear.

Soft tissues: There is a tubular thinly rim calcified structure
within the subcutaneous tissues superficial to the nasal bone
measuring 0.8 x 1.2 x 4.9 cm which is likely postsurgical material.
Remaining soft tissues are unremarkable.
IMPRESSION: No acute findings in the brain.

No acute facial bone fracture.

Thinly rim calcified tubular structure superficial to the nasal bone
likely postsurgical.

## 2018-12-23 ENCOUNTER — Other Ambulatory Visit: Payer: Self-pay | Admitting: Orthopedic Surgery

## 2018-12-30 ENCOUNTER — Other Ambulatory Visit: Payer: Self-pay | Admitting: Orthopedic Surgery

## 2019-01-12 NOTE — Patient Instructions (Addendum)
Zahriah Roes  01/12/2019   Your procedure is scheduled on: 01-20-19   Report to [facility] Main  Entrance    Report to admitting at 10:45 AM    Call this number if you have problems the morning of surgery [phone]    Remember:You may have a Clear Liquid Diet from Midnight until 7:15 AM. After 7:15 AM, nothing until after surgery.    CLEAR LIQUID DIET   Foods Allowed                                                                     Foods Excluded  Coffee and tea, regular and decaf                             liquids that you cannot  Plain Jell-O in any flavor                                             see through such as: Fruit ices (not with fruit pulp)                                     milk, soups, orange juice  Iced Popsicles                                    All solid food Carbonated beverages, regular and diet                                    Cranberry, grape and apple juices Sports drinks like Gatorade Lightly seasoned clear broth or consume(fat free) Sugar, honey syrup  Sample Menu Breakfast                                Lunch                                     Supper Cranberry juice                    Beef broth                            Chicken broth Jell-O                                     Grape juice                           Apple juice Coffee or tea  Jell-O                                      Popsicle                                                Coffee or tea                        Coffee or tea  _____________________________________________________________________       BRUSH YOUR TEETH MORNING OF SURGERY AND RINSE YOUR MOUTH OUT, NO CHEWING GUM CANDY OR MINTS.     Take these medicines the morning of surgery with A SIP OF WATER:None                               You may not have any metal on your body including hair pins and              piercings  Do not wear jewelry, cologne, lotions, powders  or deodorant             Men may shave face and neck.   Do not bring valuables to the hospital. [place].  Contacts, dentures or bridgework may not be worn into surgery.  Leave suitcase in the car. After surgery it may be brought to your room.     Patients discharged the day of surgery will not be allowed to drive home. IF YOU ARE HAVING SURGERY AND GOING HOME THE SAME DAY, YOU MUST HAVE AN ADULT TO DRIVE YOU HOME AND BE WITH YOU FOR 24 HOURS. YOU MAY GO HOME BY TAXI OR UBER OR ORTHERWISE, BUT AN ADULT MUST ACCOMPANY YOU HOME AND STAY WITH YOU FOR 24 HOURS.   Special Instructions: N/A              Please read over the following fact sheets you were given: _____________________________________________________________________             [facility] - Preparing for Surgery Before surgery, you can play an important role.  Because skin is not sterile, your skin needs to be as free of germs as possible.  You can reduce the number of germs on your skin by washing with CHG (chlorahexidine gluconate) soap before surgery.  CHG is an antiseptic cleaner which kills germs and bonds with the skin to continue killing germs even after washing. Please DO NOT use if you have an allergy to CHG or antibacterial soaps.  If your skin becomes reddened/irritated stop using the CHG and inform your nurse when you arrive at Short Stay. Do not shave (including legs and underarms) for at least 48 hours prior to the first CHG shower.  You may shave your face/neck. Please follow these instructions carefully:  1.  Shower with CHG Soap the night before surgery and the  morning of Surgery.  2.  If you choose to wash your hair, wash your hair first as usual with your  normal  shampoo.  3.  After you shampoo, rinse your hair and body thoroughly to remove the  shampoo.  4.  Use CHG as you would any other liquid soap.  You can apply chg directly  to the  skin and wash                       Gently with a scrungie or clean washcloth.  5.  Apply the CHG Soap to your body ONLY FROM THE NECK DOWN.   Do not use on face/ open                           Wound or open sores. Avoid contact with eyes, ears mouth and genitals (private parts).                       Wash face,  Genitals (private parts) with your normal soap.             6.  Wash thoroughly, paying special attention to the area where your surgery  will be performed.  7.  Thoroughly rinse your body with warm water from the neck down.  8.  DO NOT shower/wash with your normal soap after using and rinsing off  the CHG Soap.                9.  Pat yourself dry with a clean towel.            10.  Wear clean pajamas.            11.  Place clean sheets on your bed the night of your first shower and do not  sleep with pets. Day of Surgery : Do not apply any lotions/deodorants the morning of surgery.  Please wear clean clothes to the hospital/surgery center.  FAILURE TO FOLLOW THESE INSTRUCTIONS MAY RESULT IN THE CANCELLATION OF YOUR SURGERY PATIENT SIGNATURE_________________________________  NURSE SIGNATURE__________________________________  ________________________________________________________________________   Adam Phenix  An incentive spirometer is a tool that can help keep your lungs clear and active. This tool measures how well you are filling your lungs with each breath. Taking long deep breaths may help reverse or decrease the chance of developing breathing (pulmonary) problems (especially infection) following:  A long period of time when you are unable to move or be active. BEFORE THE PROCEDURE   If the spirometer includes an indicator to show your best effort, your nurse or respiratory therapist will set it to a desired goal.  If possible, sit up straight or lean slightly forward. Try not to slouch.  Hold the incentive spirometer in an upright position. INSTRUCTIONS FOR  USE  1. Sit on the edge of your bed if possible, or sit up as far as you can in bed or on a chair. 2. Hold the incentive spirometer in an upright position. 3. Breathe out normally. 4. Place the mouthpiece in your mouth and seal your lips tightly around it. 5. Breathe in slowly and as deeply as possible, raising the piston or the ball toward the top of the column. 6. Hold your breath for 3-5 seconds or for as long as possible. Allow the piston or ball to fall to the bottom of the column. 7. Remove the mouthpiece from your mouth and breathe out normally. 8. Rest for a few seconds and repeat Steps 1 through 7 at least 10 times every 1-2 hours when you are awake. Take your time and take a few normal breaths between deep breaths. 9. The spirometer may include an indicator to  show your best effort. Use the indicator as a goal to work toward during each repetition. 10. After each set of 10 deep breaths, practice coughing to be sure your lungs are clear. If you have an incision (the cut made at the time of surgery), support your incision when coughing by placing a pillow or rolled up towels firmly against it. Once you are able to get out of bed, walk around indoors and cough well. You may stop using the incentive spirometer when instructed by your caregiver.  RISKS AND COMPLICATIONS  Take your time so you do not get dizzy or light-headed.  If you are in pain, you may need to take or ask for pain medication before doing incentive spirometry. It is harder to take a deep breath if you are having pain. AFTER USE  Rest and breathe slowly and easily.  It can be helpful to keep track of a log of your progress. Your caregiver can provide you with a simple table to help with this. If you are using the spirometer at home, follow these instructions: [place] IF:   You are having difficultly using the spirometer.  You have trouble using the spirometer as often as instructed.  Your pain medication  is not giving enough relief while using the spirometer.  You develop fever of 100.5 F (38.1 C) or higher. SEEK IMMEDIATE MEDICAL CARE IF:   You cough up bloody sputum that had not been present before.  You develop fever of 102 F (38.9 C) or greater.  You develop worsening pain at or near the incision site. MAKE SURE YOU:   Understand these instructions.  Will watch your condition.  Will get help right away if you are not doing well or get worse. Document Released: 03/31/2007 Document Revised: 02/10/2012 Document Reviewed: 06/01/2007 [employer] Patient Information 2014 [employer], [place].   ________________________________________________________________________  WHAT IS A BLOOD TRANSFUSION? Blood Transfusion Information  A transfusion is the replacement of blood or some of its parts. Blood is made up of multiple cells which provide different functions.  Red blood cells carry oxygen and are used for blood loss replacement.  White blood cells fight against infection.  Platelets control bleeding.  Plasma helps clot blood.  Other blood products are available for specialized needs, such as hemophilia or other clotting disorders. BEFORE THE TRANSFUSION  Who gives blood for transfusions?   Healthy volunteers who are fully evaluated to make sure their blood is safe. This is blood bank blood. Transfusion therapy is the safest it has ever been in the practice of medicine. Before blood is taken from a donor, a complete history is taken to make sure that person has no history of diseases nor engages in risky social behavior (examples are intravenous drug use or sexual activity with multiple partners). The donor's travel history is screened to minimize risk of transmitting infections, such as malaria. The donated blood is tested for signs of infectious diseases, such as HIV and hepatitis. The blood is then tested to be sure it is compatible with you in order to minimize the chance of a  transfusion reaction. If you or a relative donates blood, this is often done in anticipation of surgery and is not appropriate for emergency situations. It takes many days to process the donated blood. RISKS AND COMPLICATIONS Although transfusion therapy is very safe and saves many lives, the main dangers of transfusion include:   Getting an infectious disease.  Developing a transfusion reaction. This is an allergic reaction  to something in the blood you were given. Every precaution is taken to prevent this. The decision to have a blood transfusion has been considered carefully by your caregiver before blood is given. Blood is not given unless the benefits outweigh the risks. AFTER THE TRANSFUSION  Right after receiving a blood transfusion, you will usually feel much better and more energetic. This is especially true if your red blood cells have gotten low (anemic). The transfusion raises the level of the red blood cells which carry oxygen, and this usually causes an energy increase.  The nurse administering the transfusion will monitor you carefully for complications. HOME CARE INSTRUCTIONS  No special instructions are needed after a transfusion. You may find your energy is better. Speak with your caregiver about any limitations on activity for underlying diseases you may have. SEEK MEDICAL CARE IF:   Your condition is not improving after your transfusion.  You develop redness or irritation at the intravenous (IV) site. SEEK IMMEDIATE MEDICAL CARE IF:  Any of the following symptoms occur over the next 12 hours:  Shaking chills.  You have a temperature by mouth above 102 F (38.9 C), not controlled by medicine.  Chest, back, or muscle pain.  People around you feel you are not acting correctly or are confused.  Shortness of breath or difficulty breathing.  Dizziness and fainting.  You get a rash or develop hives.  You have a decrease in urine output.  Your urine turns a dark  color or changes to pink, red, or brown. Any of the following symptoms occur over the next 10 days:  You have a temperature by mouth above 102 F (38.9 C), not controlled by medicine.  Shortness of breath.  Weakness after normal activity.  The white part of the eye turns yellow (jaundice).  You have a decrease in the amount of urine or are urinating less often.  Your urine turns a dark color or changes to pink, red, or brown. Document Released: 11/15/2000 Document Revised: 02/10/2012 Document Reviewed: 07/04/2008 [facility] Patient Information 2014 [place], [place].  _______________________________________________________________________

## 2019-01-13 ENCOUNTER — Encounter (HOSPITAL_COMMUNITY): Payer: Self-pay | Admitting: *Deleted

## 2019-01-13 ENCOUNTER — Other Ambulatory Visit: Payer: Self-pay

## 2019-01-13 ENCOUNTER — Encounter (HOSPITAL_COMMUNITY)
Admission: RE | Admit: 2019-01-13 | Discharge: 2019-01-13 | Disposition: A | Discharge status: Home / Self Care | Payer: Medicare Other | Source: Ambulatory Visit | Attending: Orthopedic Surgery | Admitting: Orthopedic Surgery

## 2019-01-13 ENCOUNTER — Ambulatory Visit (HOSPITAL_COMMUNITY)
Admission: RE | Admit: 2019-01-13 | Discharge: 2019-01-13 | Disposition: A | Discharge status: Home / Self Care | Payer: Medicare Other | Source: Ambulatory Visit | Attending: Orthopedic Surgery | Admitting: Orthopedic Surgery

## 2019-01-13 DIAGNOSIS — Z01818 Encounter for other preprocedural examination: Secondary | ICD-10-CM | POA: Diagnosis present

## 2019-01-13 HISTORY — DX: Unspecified osteoarthritis, unspecified site: M19.90

## 2019-01-13 LAB — URINALYSIS, ROUTINE W REFLEX MICROSCOPIC
Bilirubin Urine: NEGATIVE
Glucose, UA: NEGATIVE mg/dL
Hgb urine dipstick: NEGATIVE
Ketones, ur: NEGATIVE mg/dL
LEUKOCYTE UA: NEGATIVE
Nitrite: NEGATIVE
Protein, ur: NEGATIVE mg/dL
Specific Gravity, Urine: 1.006 (ref 1.005–1.030)
pH: 6 (ref 5.0–8.0)

## 2019-01-13 LAB — CBC WITH DIFFERENTIAL/PLATELET
Abs Immature Granulocytes: 0.03 10*3/uL (ref 0.00–0.07)
Basophils Absolute: 0 10*3/uL (ref 0.0–0.1)
Basophils Relative: 1 %
Eosinophils Absolute: 0.1 10*3/uL (ref 0.0–0.5)
Eosinophils Relative: 1 %
HCT: 43.9 % (ref 36.0–46.0)
Hemoglobin: 13.9 g/dL (ref 12.0–15.0)
Immature Granulocytes: 1 %
LYMPHS ABS: 1.6 10*3/uL (ref 0.7–4.0)
Lymphocytes Relative: 30 %
MCH: 30.4 pg (ref 26.0–34.0)
MCHC: 31.7 g/dL (ref 30.0–36.0)
MCV: 96.1 fL (ref 80.0–100.0)
Monocytes Absolute: 0.5 10*3/uL (ref 0.1–1.0)
Monocytes Relative: 10 %
NRBC: 0 % (ref 0.0–0.2)
Neutro Abs: 3.1 10*3/uL (ref 1.7–7.7)
Neutrophils Relative %: 57 %
Platelets: 269 10*3/uL (ref 150–400)
RBC: 4.57 MIL/uL (ref 3.87–5.11)
RDW: 13.2 % (ref 11.5–15.5)
WBC: 5.4 10*3/uL (ref 4.0–10.5)

## 2019-01-13 LAB — SURGICAL PCR SCREEN
MRSA, PCR: NEGATIVE
Staphylococcus aureus: NEGATIVE

## 2019-01-13 LAB — BASIC METABOLIC PANEL
Anion gap: 8 (ref 5–15)
BUN: 17 mg/dL (ref 8–23)
CHLORIDE: 103 mmol/L (ref 98–111)
CO2: 27 mmol/L (ref 22–32)
Calcium: 9.7 mg/dL (ref 8.9–10.3)
Creatinine, Ser: 0.56 mg/dL (ref 0.44–1.00)
GFR calc Af Amer: >60 mL/min (ref >60)
GFR calc non Af Amer: >60 mL/min (ref >60)
Glucose, Bld: 93 mg/dL (ref 70–99)
Potassium: 4.2 mmol/L (ref 3.5–5.1)
SODIUM: 138 mmol/L (ref 135–145)

## 2019-01-13 LAB — PROTIME-INR
INR: 0.88
Prothrombin Time: 11.9 seconds (ref 11.4–15.2)

## 2019-01-13 LAB — ABO/RH: ABO/RH(D): A POS

## 2019-01-13 LAB — APTT: aPTT: 37 seconds — ABNORMAL HIGH (ref 24–36)

## 2019-01-13 IMAGING — DX DG CHEST 2V
2 series · 2 of 2 positions shown · non-contrast
Comparison: None.

CLINICAL DATA: Preop knee surgery

EXAM:
CHEST - 2 VIEW

[chest pa]
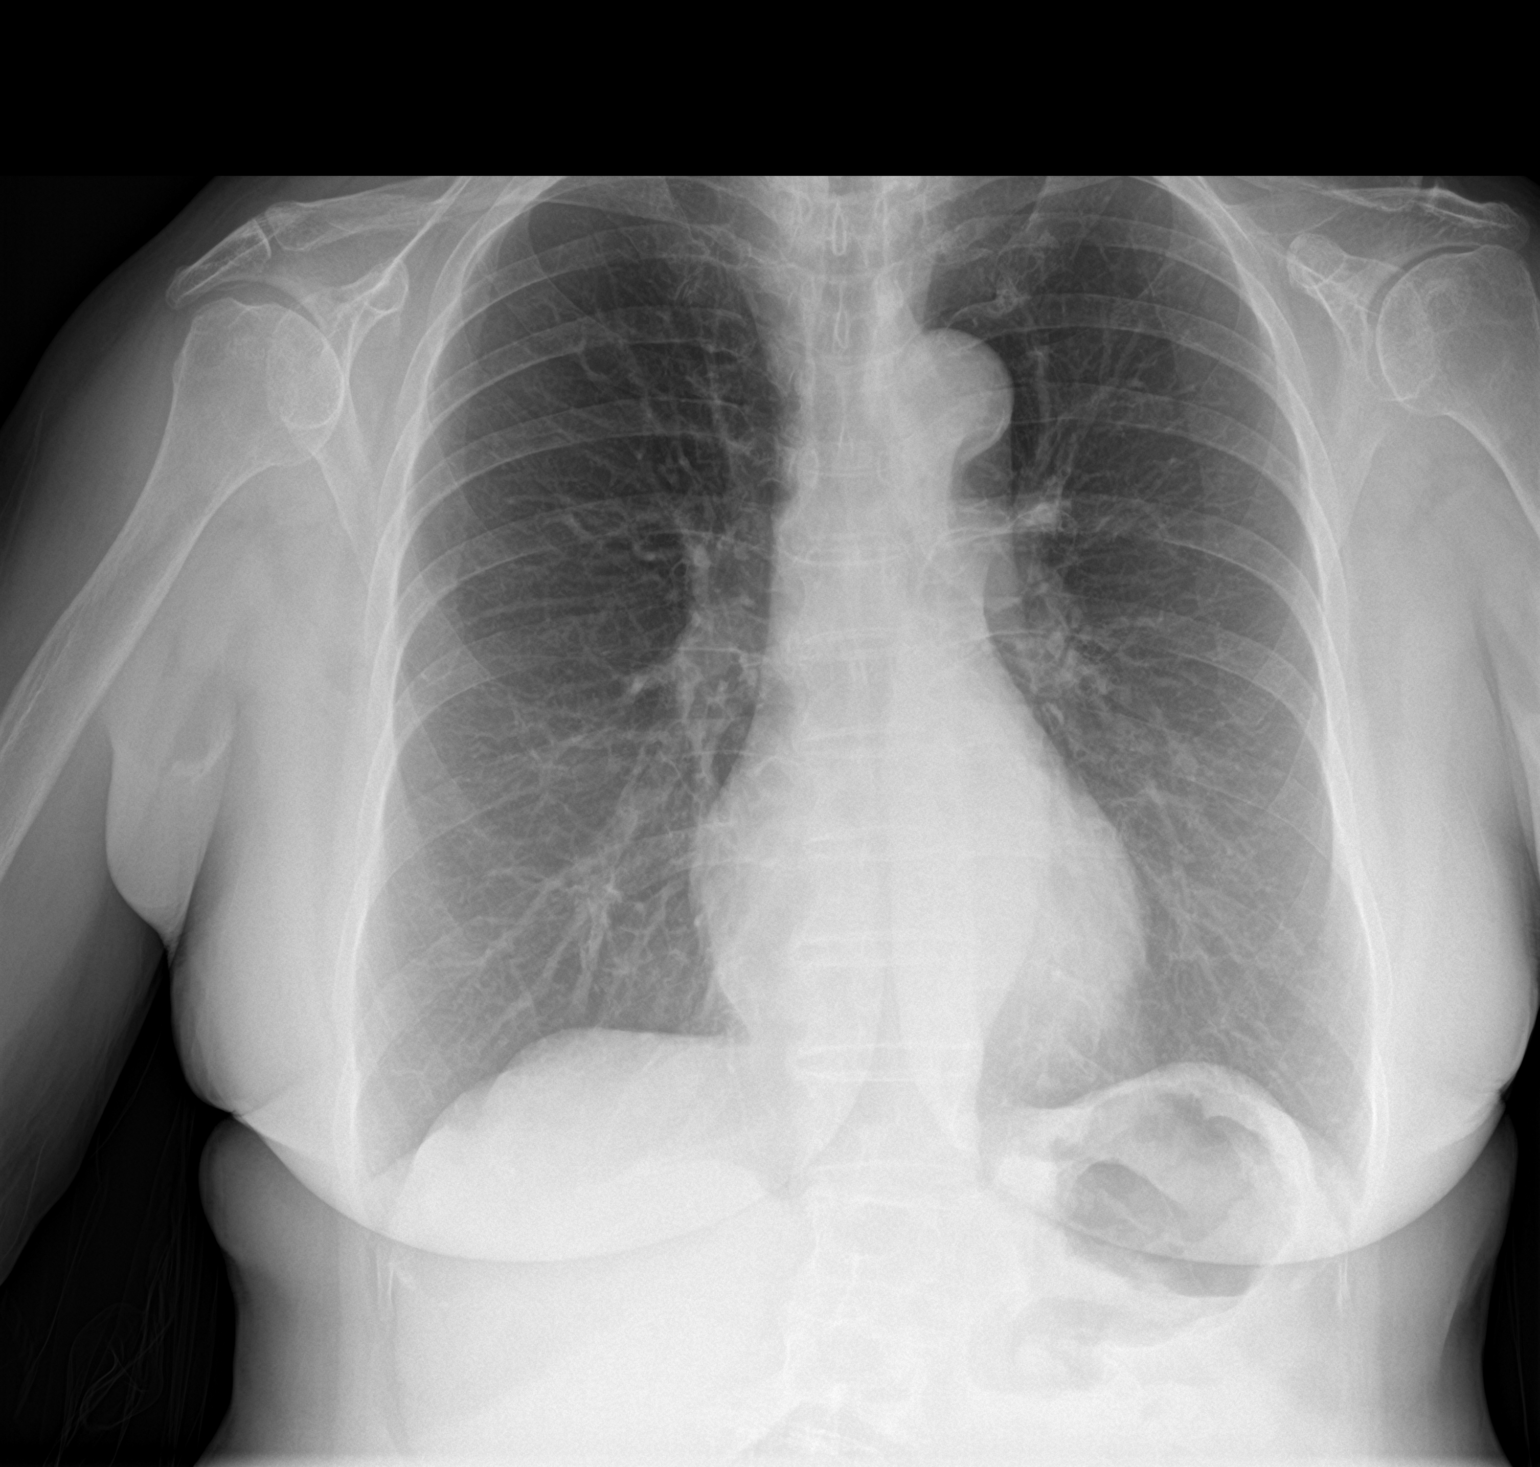

[chest lat]
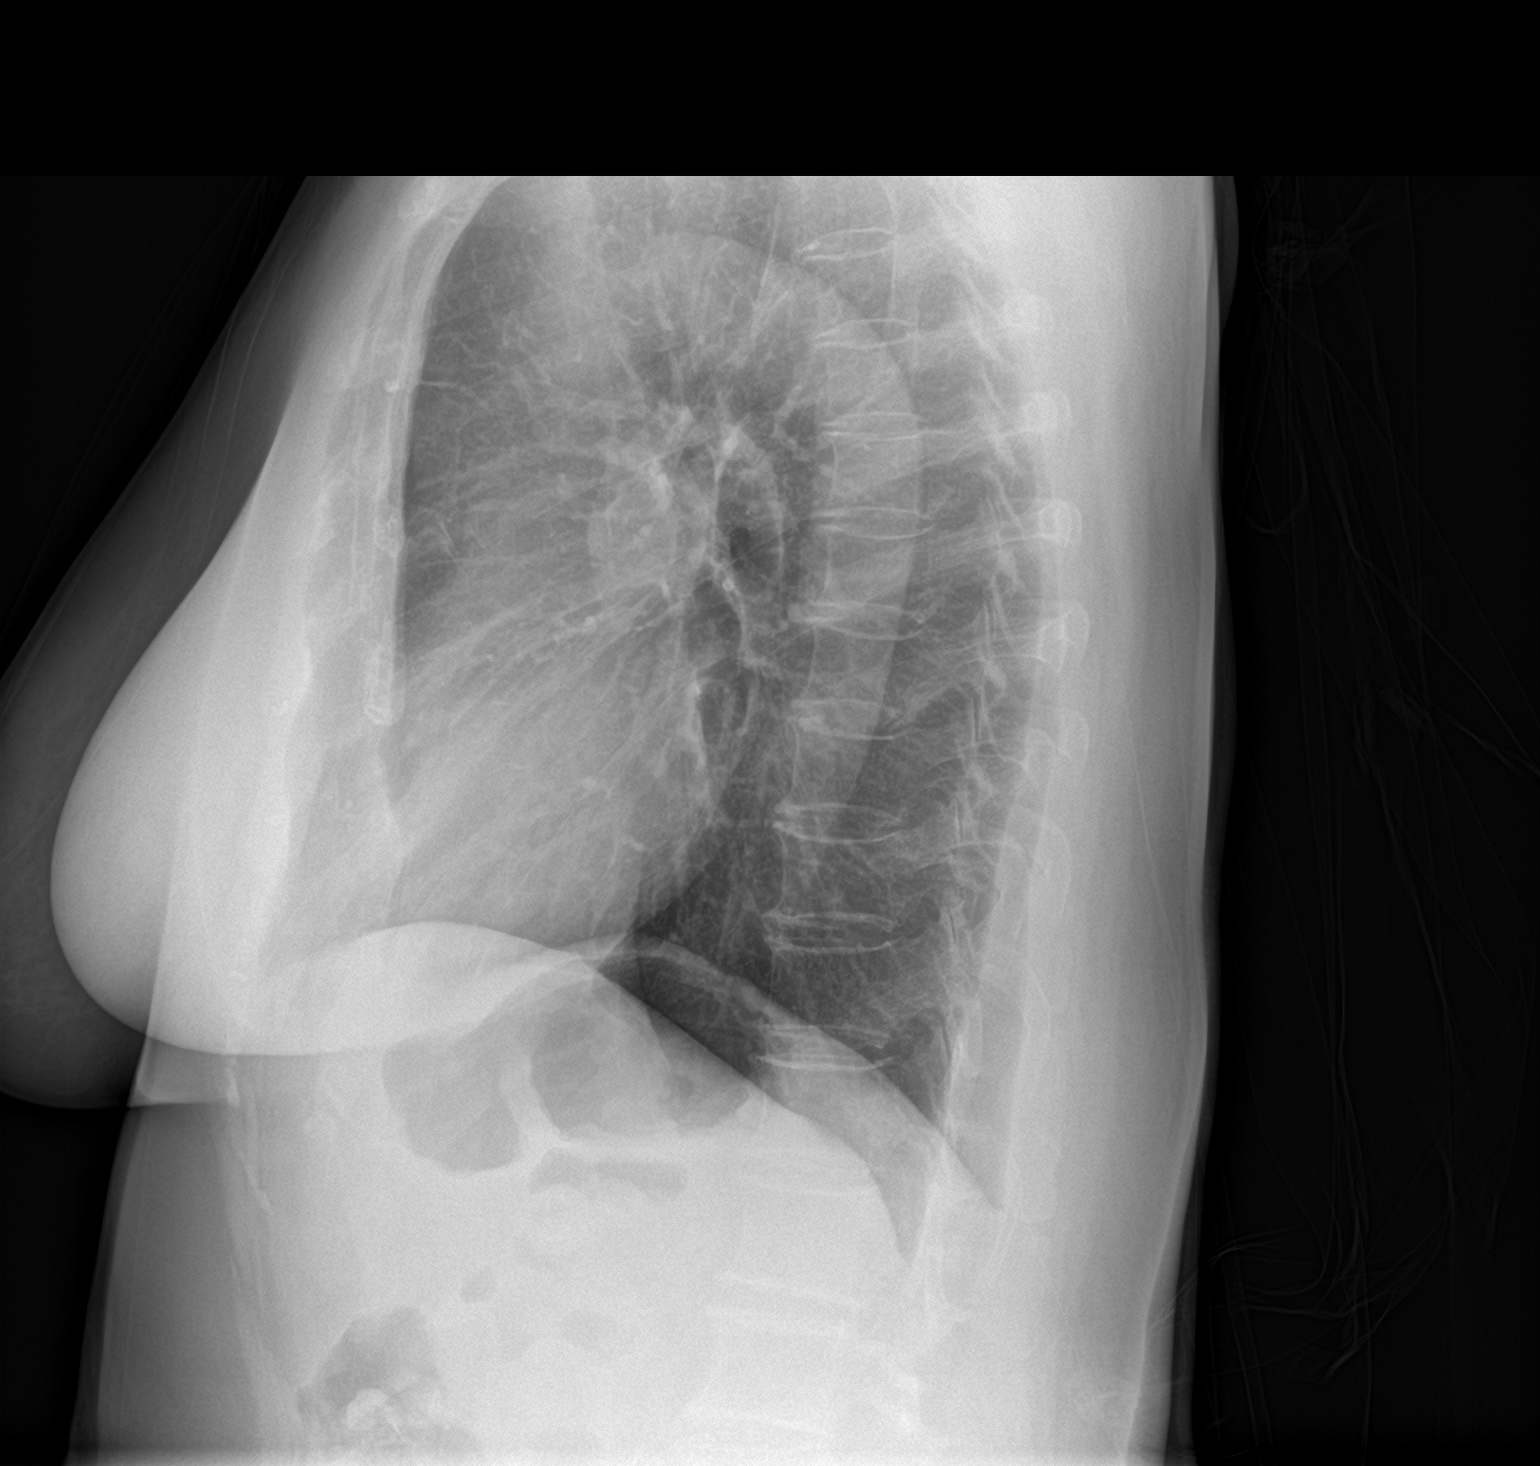

[2 of 2 positions shown; findings below may reference images not displayed]

FINDINGS: The heart size and mediastinal contours are within normal limits.
Both lungs are clear. The visualized skeletal structures are
unremarkable.
IMPRESSION: No active cardiopulmonary disease.

## 2019-01-13 NOTE — Progress Notes (Signed)
01-13-19 PTT result routed to Dr. Berenice Primas for review

## 2019-01-19 MED ORDER — BUPIVACAINE LIPOSOME 1.3 % IJ SUSP
20.0000 mL | INTRAMUSCULAR | Status: DC
  Filled 2019-01-19: qty 20

## 2019-01-19 MED ORDER — TRANEXAMIC ACID 1000 MG/10ML IV SOLN
2000.0000 mg | INTRAVENOUS | Status: DC
  Filled 2019-01-19: qty 20

## 2019-01-19 NOTE — H&P (Addendum)
TOTAL KNEE ADMISSION H&P  Patient is being admitted for right total knee arthroplasty.  Subjective:  Chief Complaint:right knee pain.  HPI: Robin Hutchinson, 73 y.o. female, has a history of pain and functional disability in the right knee due to arthritis and has failed non-surgical conservative treatments for greater than 12 weeks to includeNSAID's and/or analgesics, corticosteriod injections, viscosupplementation injections, weight reduction as appropriate and activity modification.  Onset of symptoms was gradual, starting 5 years ago with gradually worsening course since that time. The patient noted no past surgery on the right knee(s).  Patient currently rates pain in the right knee(s) at 9 out of 10 with activity. Patient has night pain, worsening of pain with activity and weight bearing, pain that interferes with activities of daily living, pain with passive range of motion and joint swelling.  Patient has evidence of joint subluxation and joint space narrowing by imaging studies. This patient has had failure of all reasonable conservative care. There is no active infection.  There are no active problems to display for this patient.  Past Medical History:  Diagnosis Date  . Arthritis     Past Surgical History:  Procedure Laterality Date  . CESAREAN SECTION      Current Facility-Administered Medications  Medication Dose Route Frequency Provider Last Rate Last Dose  . [START ON 01/20/2019] bupivacaine liposome (EXPAREL) 1.3 % injection 266 mg  20 mL Infiltration On Call to OR Dorna Leitz, MD      . Derrill Memo ON 01/20/2019] tranexamic acid (CYKLOKAPRON) 2,000 mg in sodium chloride 0.9 % 50 mL Topical Application  6,962 mg Topical To OR Dorna Leitz, MD       Current Outpatient Medications  Medication Sig Dispense Refill Last Dose  . atorvastatin (LIPITOR) 10 MG tablet Take 10 mg by mouth daily.     . meloxicam (MOBIC) 15 MG tablet Take 15 mg by mouth 2 (two) times daily as needed for pain.       Allergies  Allergen Reactions  . Aspirin Nausea Only  . Sulfa Antibiotics Rash    Social History   Tobacco Use  . Smoking status: Never Smoker  . Smokeless tobacco: Never Used  Substance Use Topics  . Alcohol use: Never    Frequency: Never    No family history on file.   ROS ROS: I have reviewed the patient's review of systems thoroughly and there are no positive responses as relates to the HPI. Objective:  Physical Exam  Vital signs in last 24 hours:    Vitals:   01/20/19 1411 01/20/19 1412  BP: 123/63   Pulse: 78 77  Resp: 12 13  Temp:    SpO2: 100% 100%   Well-developed well-nourished patient in no acute distress. Alert and oriented x3 HEENT:within normal limits Cardiac: Regular rate and rhythm Pulmonary: Lungs clear to auscultation Abdomen: Soft and nontender.  Normal active bowel sounds  Musculoskeletal: (right knee: Painful range of motion.  Limited range of motion.  Mild varus malalignment.  No instability.  Trace effusion.  Neurovascular intact distally. Labs: Recent Results (from the past 2160 hour(s))  ABO/Rh     Status: None   Collection Time: 01/13/19  9:33 AM  Result Value Ref Range   ABO/RH(D)      A POS Performed at [facility], [place] [address] [place], [place] [address]   APTT     Status: Abnormal   Collection Time: 01/13/19  9:47 AM  Result Value Ref Range   aPTT 37 (  H) 24 - 36 seconds    Comment:        IF BASELINE aPTT IS ELEVATED, SUGGEST PATIENT RISK ASSESSMENT BE USED TO DETERMINE APPROPRIATE ANTICOAGULANT THERAPY. Performed at [facility], [place] [address] [place], [place] [address]   Basic metabolic panel     Status: None   Collection Time: 01/13/19  9:47 AM  Result Value Ref Range   Sodium 138 135 - 145 mmol/L   Potassium 4.2 3.5 - 5.1 mmol/L   Chloride 103 98 - 111 mmol/L   CO2 27 22 - 32 mmol/L   Glucose, Bld 93 70 - 99 mg/dL   BUN 17 8 - 23 mg/dL   Creatinine, Ser 0.56 0.44 -  1.00 mg/dL   Calcium 9.7 8.9 - 10.3 mg/dL   GFR calc non Af Amer >60 >60 mL/min   GFR calc Af Amer >60 >60 mL/min   Anion gap 8 5 - 15    Comment: Performed at [facility], [place] [address] [place], [place] [address]  CBC WITH DIFFERENTIAL     Status: None   Collection Time: 01/13/19  9:47 AM  Result Value Ref Range   WBC 5.4 4.0 - 10.5 K/uL   RBC 4.57 3.87 - 5.11 MIL/uL   Hemoglobin 13.9 12.0 - 15.0 g/dL   HCT 43.9 36.0 - 46.0 %   MCV 96.1 80.0 - 100.0 fL   MCH 30.4 26.0 - 34.0 pg   MCHC 31.7 30.0 - 36.0 g/dL   RDW 13.2 11.5 - 15.5 %   Platelets 269 150 - 400 K/uL   nRBC 0.0 0.0 - 0.2 %   Neutrophils Relative % 57 %   Neutro Abs 3.1 1.7 - 7.7 K/uL   Lymphocytes Relative 30 %   Lymphs Abs 1.6 0.7 - 4.0 K/uL   Monocytes Relative 10 %   Monocytes Absolute 0.5 0.1 - 1.0 K/uL   Eosinophils Relative 1 %   Eosinophils Absolute 0.1 0.0 - 0.5 K/uL   Basophils Relative 1 %   Basophils Absolute 0.0 0.0 - 0.1 K/uL   Immature Granulocytes 1 %   Abs Immature Granulocytes 0.03 0.00 - 0.07 K/uL    Comment: Performed at [facility], [place] [address] [place], [place] [address]  Protime-INR     Status: None   Collection Time: 01/13/19  9:47 AM  Result Value Ref Range   Prothrombin Time 11.9 11.4 - 15.2 seconds   INR 0.88     Comment: Performed at [facility], [place] [address], [place], [place] [address]  Type and screen Order type and screen if day of surgery is less than 15 days from draw of preadmission visit or order morning of surgery if day of surgery is greater than 6 days from preadmission visit.     Status: None   Collection Time: 01/13/19  9:47 AM  Result Value Ref Range   ABO/RH(D) A POS    Antibody Screen NEG    Sample Expiration 01/27/2019    Extend sample reason      NO TRANSFUSIONS OR PREGNANCY IN THE PAST 3 MONTHS Performed at [facility], [place] [address], [place], [place] [address]   Urinalysis,  Routine w reflex microscopic     Status: Abnormal   Collection Time: 01/13/19  9:47 AM  Result Value Ref Range   Color, Urine STRAW (A) YELLOW   APPearance CLEAR CLEAR   Specific Gravity, Urine 1.006 1.005 - 1.030  pH 6.0 5.0 - 8.0   Glucose, UA NEGATIVE NEGATIVE mg/dL   Hgb urine dipstick NEGATIVE NEGATIVE   Bilirubin Urine NEGATIVE NEGATIVE   Ketones, ur NEGATIVE NEGATIVE mg/dL   Protein, ur NEGATIVE NEGATIVE mg/dL   Nitrite NEGATIVE NEGATIVE   Leukocytes,Ua NEGATIVE NEGATIVE    Comment: Performed at [place] [address], [place], [place] [address]  Surgical pcr screen     Status: None   Collection Time: 01/13/19  9:47 AM  Result Value Ref Range   MRSA, PCR NEGATIVE NEGATIVE   Staphylococcus aureus NEGATIVE NEGATIVE    Comment: (NOTE) The Xpert SA Assay (FDA approved for NASAL specimens in patients 58 years of age and older), is one component of a comprehensive surveillance program. It is not intended to diagnose infection nor to guide or monitor treatment. Performed at [facility], [place] [address], [place], [place] [address]     Estimated body mass index is 27.8 kg/m as calculated from the following:   Height as of 01/13/19: 5\' 2"  (1.575 m).   Weight as of 01/13/19: 68.9 kg.   Imaging Review Plain radiographs demonstrate severe degenerative joint disease of the right knee(s). The overall alignment ismild varus. The bone quality appears to be fair for age and reported activity level.      Assessment/Plan:  End stage arthritis, right knee   The patient history, physical examination, clinical judgment of the provider and imaging studies are consistent with end stage degenerative joint disease of the right knee(s) and total knee arthroplasty is deemed medically necessary. The treatment options including medical management, injection therapy arthroscopy and arthroplasty were discussed at length. The risks and benefits of total  knee arthroplasty were presented and reviewed. The risks due to aseptic loosening, infection, stiffness, patella tracking problems, thromboembolic complications and other imponderables were discussed. The patient acknowledged the explanation, agreed to proceed with the plan and consent was signed. Patient is being admitted for inpatient treatment for surgery, pain control, PT, OT, prophylactic antibiotics, VTE prophylaxis, progressive ambulation and ADL's and discharge planning. The patient is planning to be discharged home with home health services     Patient's anticipated LOS is less than 2 midnights, meeting these requirements: - Younger than 25 - Lives within 1 hour of care - Has a competent adult at home to recover with post-op recover - NO history of  - Chronic pain requiring opiods  - Diabetes  - Coronary Artery Disease  - Heart failure  - Heart attack  - Stroke  - DVT/VTE  - Cardiac arrhythmia  - Respiratory Failure/COPD  - Renal failure  - Anemia  - Advanced Liver disease

## 2019-01-20 ENCOUNTER — Encounter (HOSPITAL_COMMUNITY): Payer: Self-pay | Admitting: General Practice

## 2019-01-20 ENCOUNTER — Encounter (HOSPITAL_COMMUNITY)
Admission: RE | Disposition: A | Discharge status: Home / Self Care | Payer: Self-pay | Source: Home / Self Care | Attending: Orthopedic Surgery

## 2019-01-20 ENCOUNTER — Ambulatory Visit (HOSPITAL_COMMUNITY): Payer: Medicare Other | Admitting: Registered Nurse

## 2019-01-20 ENCOUNTER — Observation Stay (HOSPITAL_COMMUNITY)
Admission: RE | Admit: 2019-01-20 | Discharge: 2019-01-22 | Disposition: A | Discharge status: Home / Self Care | Payer: Medicare Other | Attending: Orthopedic Surgery | Admitting: Orthopedic Surgery

## 2019-01-20 ENCOUNTER — Ambulatory Visit (HOSPITAL_COMMUNITY): Payer: Medicare Other | Admitting: Physician Assistant

## 2019-01-20 DIAGNOSIS — Z791 Long term (current) use of non-steroidal anti-inflammatories (NSAID): Secondary | ICD-10-CM | POA: Diagnosis not present

## 2019-01-20 DIAGNOSIS — M1711 Unilateral primary osteoarthritis, right knee: Secondary | ICD-10-CM | POA: Diagnosis present

## 2019-01-20 DIAGNOSIS — Z882 Allergy status to sulfonamides status: Secondary | ICD-10-CM | POA: Insufficient documentation

## 2019-01-20 DIAGNOSIS — Z79899 Other long term (current) drug therapy: Secondary | ICD-10-CM | POA: Diagnosis not present

## 2019-01-20 DIAGNOSIS — Z886 Allergy status to analgesic agent status: Secondary | ICD-10-CM | POA: Insufficient documentation

## 2019-01-20 DIAGNOSIS — Z7982 Long term (current) use of aspirin: Secondary | ICD-10-CM | POA: Diagnosis not present

## 2019-01-20 HISTORY — PX: TOTAL KNEE ARTHROPLASTY: SHX125

## 2019-01-20 LAB — TYPE AND SCREEN
ABO/RH(D): A POS
Antibody Screen: NEGATIVE

## 2019-01-20 SURGERY — ARTHROPLASTY, KNEE, TOTAL
Anesthesia: Spinal | Site: Knee | Laterality: Right

## 2019-01-20 MED ORDER — ASPIRIN EC 325 MG PO TBEC: 325.0000 mg | DELAYED_RELEASE_TABLET | Freq: Two times a day (BID) | ORAL | 0 refills | Status: DC

## 2019-01-20 MED ORDER — MIDAZOLAM HCL 2 MG/2ML IJ SOLN
1.0000 mg | INTRAMUSCULAR | Status: DC
  Administered 2019-01-20: 2 mg via INTRAVENOUS
  Filled 2019-01-20: qty 2

## 2019-01-20 MED ORDER — ONDANSETRON HCL 4 MG/2ML IJ SOLN
INTRAMUSCULAR | Status: AC
  Filled 2019-01-20: qty 2

## 2019-01-20 MED ORDER — EPHEDRINE SULFATE-NACL 50-0.9 MG/10ML-% IV SOSY
PREFILLED_SYRINGE | INTRAVENOUS | Status: DC | PRN
  Administered 2019-01-20: 5 mg via INTRAVENOUS

## 2019-01-20 MED ORDER — DIPHENHYDRAMINE HCL 12.5 MG/5ML PO ELIX: 12.5000 mg | ORAL_SOLUTION | ORAL | Status: DC | PRN

## 2019-01-20 MED ORDER — SODIUM CHLORIDE 0.9% FLUSH
INTRAVENOUS | Status: DC | PRN
  Administered 2019-01-20: 50 mL

## 2019-01-20 MED ORDER — ONDANSETRON HCL 4 MG PO TABS
4.0000 mg | ORAL_TABLET | Freq: Four times a day (QID) | ORAL | Status: DC | PRN
  Administered 2019-01-21: 4 mg via ORAL
  Filled 2019-01-20: qty 1

## 2019-01-20 MED ORDER — TRANEXAMIC ACID-NACL 1000-0.7 MG/100ML-% IV SOLN
1000.0000 mg | Freq: Once | INTRAVENOUS | Status: AC
  Administered 2019-01-20: 1000 mg via INTRAVENOUS
  Filled 2019-01-20: qty 100

## 2019-01-20 MED ORDER — HYDROMORPHONE HCL 1 MG/ML IJ SOLN: 0.5000 mg | INTRAMUSCULAR | Status: DC | PRN

## 2019-01-20 MED ORDER — BISACODYL 5 MG PO TBEC: 5.0000 mg | DELAYED_RELEASE_TABLET | Freq: Every day | ORAL | Status: DC | PRN

## 2019-01-20 MED ORDER — SODIUM CHLORIDE 0.9 % IV SOLN
INTRAVENOUS | Status: DC
  Administered 2019-01-20 – 2019-01-21 (×2): via INTRAVENOUS

## 2019-01-20 MED ORDER — SODIUM CHLORIDE 0.9 % IR SOLN
Status: DC | PRN
  Administered 2019-01-20: 1

## 2019-01-20 MED ORDER — TIZANIDINE HCL 2 MG PO TABS: 2.0000 mg | ORAL_TABLET | Freq: Three times a day (TID) | ORAL | 0 refills | Status: DC | PRN

## 2019-01-20 MED ORDER — POLYETHYLENE GLYCOL 3350 17 G PO PACK: 17.0000 g | PACK | Freq: Every day | ORAL | Status: DC | PRN

## 2019-01-20 MED ORDER — PROPOFOL 500 MG/50ML IV EMUL
INTRAVENOUS | Status: DC | PRN
  Administered 2019-01-20: 50 ug/kg/min via INTRAVENOUS

## 2019-01-20 MED ORDER — ACETAMINOPHEN 325 MG PO TABS: 325.0000 mg | ORAL_TABLET | Freq: Four times a day (QID) | ORAL | Status: DC | PRN

## 2019-01-20 MED ORDER — PROPOFOL 10 MG/ML IV BOLUS
INTRAVENOUS | Status: DC | PRN
  Administered 2019-01-20: 20 mg via INTRAVENOUS

## 2019-01-20 MED ORDER — PHENYLEPHRINE 40 MCG/ML (10ML) SYRINGE FOR IV PUSH (FOR BLOOD PRESSURE SUPPORT)
PREFILLED_SYRINGE | INTRAVENOUS | Status: DC | PRN
  Administered 2019-01-20 (×5): 80 ug via INTRAVENOUS
  Administered 2019-01-20: 40 ug via INTRAVENOUS
  Administered 2019-01-20: 80 ug via INTRAVENOUS

## 2019-01-20 MED ORDER — PANTOPRAZOLE SODIUM 40 MG PO TBEC: 40.0000 mg | DELAYED_RELEASE_TABLET | Freq: Every day | ORAL | 1 refills | Status: DC

## 2019-01-20 MED ORDER — ATORVASTATIN CALCIUM 10 MG PO TABS
10.0000 mg | ORAL_TABLET | Freq: Every day | ORAL | Status: DC
  Administered 2019-01-20 – 2019-01-21 (×2): 10 mg via ORAL
  Filled 2019-01-20 (×2): qty 1

## 2019-01-20 MED ORDER — PANTOPRAZOLE SODIUM 40 MG PO TBEC
40.0000 mg | DELAYED_RELEASE_TABLET | Freq: Every day | ORAL | Status: DC
  Administered 2019-01-20 – 2019-01-22 (×3): 40 mg via ORAL
  Filled 2019-01-20 (×3): qty 1

## 2019-01-20 MED ORDER — METHOCARBAMOL 500 MG PO TABS: 500.0000 mg | ORAL_TABLET | Freq: Four times a day (QID) | ORAL | Status: DC | PRN

## 2019-01-20 MED ORDER — FENTANYL CITRATE (PF) 100 MCG/2ML IJ SOLN
50.0000 ug | INTRAMUSCULAR | Status: DC
  Administered 2019-01-20: 50 ug via INTRAVENOUS
  Filled 2019-01-20: qty 2

## 2019-01-20 MED ORDER — DEXAMETHASONE SODIUM PHOSPHATE 10 MG/ML IJ SOLN
INTRAMUSCULAR | Status: DC | PRN
  Administered 2019-01-20: 10 mg via INTRAVENOUS

## 2019-01-20 MED ORDER — CHLORHEXIDINE GLUCONATE 4 % EX LIQD: 60.0000 mL | Freq: Once | CUTANEOUS | Status: DC

## 2019-01-20 MED ORDER — PHENYLEPHRINE 40 MCG/ML (10ML) SYRINGE FOR IV PUSH (FOR BLOOD PRESSURE SUPPORT)
PREFILLED_SYRINGE | INTRAVENOUS | Status: AC
  Filled 2019-01-20: qty 10

## 2019-01-20 MED ORDER — BUPIVACAINE HCL (PF) 0.5 % IJ SOLN
INTRAMUSCULAR | Status: AC
  Filled 2019-01-20: qty 30

## 2019-01-20 MED ORDER — PROMETHAZINE HCL 25 MG/ML IJ SOLN: 6.2500 mg | INTRAMUSCULAR | Status: DC | PRN

## 2019-01-20 MED ORDER — LIDOCAINE 2% (20 MG/ML) 5 ML SYRINGE
INTRAMUSCULAR | Status: AC
  Filled 2019-01-20: qty 5

## 2019-01-20 MED ORDER — OXYCODONE HCL 5 MG PO TABS
5.0000 mg | ORAL_TABLET | ORAL | Status: DC | PRN
  Administered 2019-01-21: 5 mg via ORAL
  Filled 2019-01-20: qty 1

## 2019-01-20 MED ORDER — TRANEXAMIC ACID-NACL 1000-0.7 MG/100ML-% IV SOLN
1000.0000 mg | INTRAVENOUS | Status: AC
  Administered 2019-01-20: 1000 mg via INTRAVENOUS
  Filled 2019-01-20: qty 100

## 2019-01-20 MED ORDER — EPHEDRINE 5 MG/ML INJ
INTRAVENOUS | Status: AC
  Filled 2019-01-20: qty 10

## 2019-01-20 MED ORDER — WATER FOR IRRIGATION, STERILE IR SOLN
Status: DC | PRN
  Administered 2019-01-20: 1000 mL

## 2019-01-20 MED ORDER — HYDROMORPHONE HCL 1 MG/ML IJ SOLN: 0.2500 mg | INTRAMUSCULAR | Status: DC | PRN

## 2019-01-20 MED ORDER — LACTATED RINGERS IV SOLN
INTRAVENOUS | Status: DC
  Administered 2019-01-20 (×2): via INTRAVENOUS

## 2019-01-20 MED ORDER — BUPIVACAINE LIPOSOME 1.3 % IJ SUSP
INTRAMUSCULAR | Status: DC | PRN
  Administered 2019-01-20: 20 mL

## 2019-01-20 MED ORDER — LIDOCAINE 2% (20 MG/ML) 5 ML SYRINGE
INTRAMUSCULAR | Status: DC | PRN
  Administered 2019-01-20: 40 mg via INTRAVENOUS

## 2019-01-20 MED ORDER — DOCUSATE SODIUM 100 MG PO CAPS: 100.0000 mg | ORAL_CAPSULE | Freq: Two times a day (BID) | ORAL | 0 refills | Status: DC

## 2019-01-20 MED ORDER — PROPOFOL 10 MG/ML IV BOLUS
INTRAVENOUS | Status: AC
  Filled 2019-01-20: qty 40

## 2019-01-20 MED ORDER — ONDANSETRON HCL 4 MG/2ML IJ SOLN
INTRAMUSCULAR | Status: DC | PRN
  Administered 2019-01-20: 4 mg via INTRAVENOUS

## 2019-01-20 MED ORDER — ONDANSETRON HCL 4 MG/2ML IJ SOLN: 4.0000 mg | Freq: Four times a day (QID) | INTRAMUSCULAR | Status: DC | PRN

## 2019-01-20 MED ORDER — ALUM & MAG HYDROXIDE-SIMETH 200-200-20 MG/5ML PO SUSP: 30.0000 mL | ORAL | Status: DC | PRN

## 2019-01-20 MED ORDER — OXYCODONE-ACETAMINOPHEN 5-325 MG PO TABS: 1.0000 | ORAL_TABLET | Freq: Four times a day (QID) | ORAL | 0 refills | Status: DC | PRN

## 2019-01-20 MED ORDER — MAGNESIUM CITRATE PO SOLN: 1.0000 | Freq: Once | ORAL | Status: DC | PRN

## 2019-01-20 MED ORDER — SODIUM CHLORIDE (PF) 0.9 % IJ SOLN
INTRAMUSCULAR | Status: AC
  Filled 2019-01-20: qty 50

## 2019-01-20 MED ORDER — DOCUSATE SODIUM 100 MG PO CAPS
100.0000 mg | ORAL_CAPSULE | Freq: Two times a day (BID) | ORAL | Status: DC
  Administered 2019-01-20 – 2019-01-22 (×4): 100 mg via ORAL
  Filled 2019-01-20 (×4): qty 1

## 2019-01-20 MED ORDER — BUPIVACAINE HCL (PF) 0.5 % IJ SOLN
INTRAMUSCULAR | Status: DC | PRN
  Administered 2019-01-20: 20 mL via PERINEURAL

## 2019-01-20 MED ORDER — ASPIRIN EC 325 MG PO TBEC
325.0000 mg | DELAYED_RELEASE_TABLET | Freq: Two times a day (BID) | ORAL | Status: DC
  Administered 2019-01-20 – 2019-01-22 (×3): 325 mg via ORAL
  Filled 2019-01-20 (×3): qty 1

## 2019-01-20 MED ORDER — DEXAMETHASONE SODIUM PHOSPHATE 10 MG/ML IJ SOLN
10.0000 mg | Freq: Two times a day (BID) | INTRAMUSCULAR | Status: AC
  Administered 2019-01-20 – 2019-01-21 (×3): 10 mg via INTRAVENOUS
  Filled 2019-01-20 (×3): qty 1

## 2019-01-20 MED ORDER — CEFAZOLIN SODIUM-DEXTROSE 2-4 GM/100ML-% IV SOLN
2.0000 g | Freq: Four times a day (QID) | INTRAVENOUS | Status: AC
  Administered 2019-01-20 – 2019-01-21 (×2): 2 g via INTRAVENOUS
  Filled 2019-01-20 (×2): qty 100

## 2019-01-20 MED ORDER — GABAPENTIN 300 MG PO CAPS
300.0000 mg | ORAL_CAPSULE | Freq: Two times a day (BID) | ORAL | Status: DC
  Administered 2019-01-20 – 2019-01-22 (×4): 300 mg via ORAL
  Filled 2019-01-20 (×4): qty 1

## 2019-01-20 MED ORDER — METHOCARBAMOL 1000 MG/10ML IJ SOLN
500.0000 mg | Freq: Four times a day (QID) | INTRAVENOUS | Status: DC | PRN
  Filled 2019-01-20: qty 5

## 2019-01-20 MED ORDER — TRAMADOL HCL 50 MG PO TABS
50.0000 mg | ORAL_TABLET | Freq: Four times a day (QID) | ORAL | Status: DC
  Administered 2019-01-21 – 2019-01-22 (×5): 50 mg via ORAL
  Filled 2019-01-20 (×5): qty 1

## 2019-01-20 MED ORDER — BUPIVACAINE HCL (PF) 0.5 % IJ SOLN
INTRAMUSCULAR | Status: DC | PRN
  Administered 2019-01-20: 30 mL

## 2019-01-20 MED ORDER — CEFAZOLIN SODIUM-DEXTROSE 2-4 GM/100ML-% IV SOLN
2.0000 g | INTRAVENOUS | Status: AC
  Administered 2019-01-20: 2 g via INTRAVENOUS
  Filled 2019-01-20: qty 100

## 2019-01-20 MED ORDER — DEXAMETHASONE SODIUM PHOSPHATE 10 MG/ML IJ SOLN
INTRAMUSCULAR | Status: AC
  Filled 2019-01-20: qty 1

## 2019-01-20 MED ORDER — CLONIDINE HCL (ANALGESIA) 100 MCG/ML EP SOLN
EPIDURAL | Status: DC | PRN
  Administered 2019-01-20: 70 ug

## 2019-01-20 SURGICAL SUPPLY — 48 items
ATTUNE MED DOME PAT 38 KNEE (Knees) ×2 IMPLANT
ATTUNE PSFEM RTSZ5 NARCEM KNEE (Femur) ×2 IMPLANT
ATTUNE PSRP INSR SZ5 8 KNEE (Insert) ×2 IMPLANT
BAG ZIPLOCK 12X15 (MISCELLANEOUS) ×2 IMPLANT
BANDAGE ACE 6X5 VEL STRL LF (GAUZE/BANDAGES/DRESSINGS) ×2 IMPLANT
BASE TIBIAL ROT PLAT SZ 5 KNEE (Knees) ×1 IMPLANT
BENZOIN TINCTURE PRP APPL 2/3 (GAUZE/BANDAGES/DRESSINGS) ×2 IMPLANT
BLADE SAGITTAL 25.0X1.19X90 (BLADE) ×2 IMPLANT
BLADE SAW SGTL 11.0X1.19X90.0M (BLADE) ×2 IMPLANT
BLADE SURG SZ10 CARB STEEL (BLADE) ×4 IMPLANT
BOOTIES KNEE HIGH SLOAN (MISCELLANEOUS) ×2 IMPLANT
BOWL SMART MIX CTS (DISPOSABLE) ×2 IMPLANT
CEMENT HV SMART SET (Cement) ×4 IMPLANT
COVER SURGICAL LIGHT HANDLE (MISCELLANEOUS) ×2 IMPLANT
COVER WAND RF STERILE (DRAPES) IMPLANT
CUFF TOURN SGL QUICK 34 (TOURNIQUET CUFF) ×1
CUFF TRNQT CYL 34X4.125X (TOURNIQUET CUFF) ×1 IMPLANT
DECANTER SPIKE VIAL GLASS SM (MISCELLANEOUS) ×2 IMPLANT
DRAPE U-SHAPE 47X51 STRL (DRAPES) ×2 IMPLANT
DRSG AQUACEL AG ADV 3.5X10 (GAUZE/BANDAGES/DRESSINGS) ×2 IMPLANT
DURAPREP 26ML APPLICATOR (WOUND CARE) ×2 IMPLANT
ELECT REM PT RETURN 15FT ADLT (MISCELLANEOUS) ×2 IMPLANT
GLOVE BIOGEL PI IND STRL 8 (GLOVE) ×2 IMPLANT
GLOVE BIOGEL PI INDICATOR 8 (GLOVE) ×2
GLOVE ECLIPSE 7.5 STRL STRAW (GLOVE) ×4 IMPLANT
GOWN STRL REUS W/TWL XL LVL3 (GOWN DISPOSABLE) ×4 IMPLANT
HANDPIECE INTERPULSE COAX TIP (DISPOSABLE) ×1
HOLDER FOLEY CATH W/STRAP (MISCELLANEOUS) ×2 IMPLANT
HOOD PEEL AWAY FLYTE STAYCOOL (MISCELLANEOUS) ×6 IMPLANT
MANIFOLD NEPTUNE II (INSTRUMENTS) ×2 IMPLANT
NEEDLE HYPO 22GX1.5 SAFETY (NEEDLE) ×2 IMPLANT
NS IRRIG 1000ML POUR BTL (IV SOLUTION) ×2 IMPLANT
PACK ICE MAXI GEL EZY WRAP (MISCELLANEOUS) ×2 IMPLANT
PACK TOTAL KNEE CUSTOM (KITS) ×2 IMPLANT
PADDING CAST COTTON 6X4 STRL (CAST SUPPLIES) ×2 IMPLANT
PIN STEINMAN FIXATION KNEE (PIN) ×2 IMPLANT
PIN THREADED HEADED SIGMA (PIN) ×2 IMPLANT
PROTECTOR NERVE ULNAR (MISCELLANEOUS) ×2 IMPLANT
SET HNDPC FAN SPRY TIP SCT (DISPOSABLE) ×1 IMPLANT
STRIP CLOSURE SKIN 1/2X4 (GAUZE/BANDAGES/DRESSINGS) IMPLANT
SUT MNCRL AB 3-0 PS2 18 (SUTURE) ×2 IMPLANT
SUT VIC AB 0 CT1 36 (SUTURE) ×2 IMPLANT
SUT VIC AB 1 CT1 36 (SUTURE) ×4 IMPLANT
SYR CONTROL 10ML LL (SYRINGE) ×4 IMPLANT
TIBIAL BASE ROT PLAT SZ 5 KNEE (Knees) ×2 IMPLANT
TRAY FOLEY MTR SLVR 16FR STAT (SET/KITS/TRAYS/PACK) ×2 IMPLANT
WATER STERILE IRR 1000ML POUR (IV SOLUTION) ×4 IMPLANT
YANKAUER SUCT BULB TIP 10FT TU (MISCELLANEOUS) ×2 IMPLANT

## 2019-01-20 NOTE — Discharge Instructions (Signed)

## 2019-01-20 NOTE — Transfer of Care (Signed)
Immediate Anesthesia Transfer of Care Note  Patient: Robin Hutchinson  Procedure(s) Performed: TOTAL KNEE ARTHROPLASTY (Right Knee)  Patient Location: PACU  Anesthesia Type:Spinal and MAC combined with regional for post-op pain  Level of Consciousness: awake, alert , oriented and patient cooperative  Airway & Oxygen Therapy: Patient Spontanous Breathing and Patient connected to face mask oxygen  Post-op Assessment: Report given to RN and Post -op Vital signs reviewed and stable  Post vital signs: Reviewed and stable  Last Vitals:  Vitals Value Taken Time  BP 118/68 01/20/2019  4:15 PM  Temp    Pulse 73 01/20/2019  4:16 PM  Resp 15 01/20/2019  4:16 PM  SpO2 100 % 01/20/2019  4:16 PM  Vitals shown include unvalidated device data.  Last Pain:  Vitals:   01/20/19 1152  TempSrc: Oral  PainSc: 0-No pain         Complications: No apparent anesthesia complications

## 2019-01-20 NOTE — Anesthesia Procedure Notes (Signed)
Anesthesia Procedure Image    

## 2019-01-20 NOTE — Progress Notes (Signed)
Assisted Dr. Kalman Shan  with  Right knee adductor canal  block. Side rails up, monitors on throughout procedure. See vital signs in flow sheet. Tolerated Procedure well.

## 2019-01-20 NOTE — Anesthesia Procedure Notes (Signed)
Anesthesia Regional Block: Adductor canal block   Pre-Anesthetic Checklist: ,, timeout performed, Correct Patient, Correct Site, Correct Laterality, Correct Procedure, Correct Position, site marked, Risks and benefits discussed,  Surgical consent,  Pre-op evaluation,  At surgeon's request and post-op pain management  Laterality: Right  Prep: chloraprep       Needles:  Injection technique: Single-shot  Needle Type: Echogenic Needle     Needle Length: 9cm      Additional Needles:   Procedures:,,,, ultrasound used (permanent image in chart),,,,  Narrative:  Start time: 01/20/2019 1:42 PM End time: 01/20/2019 1:50 PM Injection made incrementally with aspirations every 5 mL.  Performed by: Personally  Anesthesiologist: Myrtie Soman, MD  Additional Notes: Patient tolerated the procedure well without complications

## 2019-01-20 NOTE — Anesthesia Procedure Notes (Signed)
Procedure Name: [place] Performed by: West Pugh, CRNA Pre-anesthesia Checklist: Patient identified, Emergency Drugs available, Suction available, Patient being monitored and Timeout performed Patient Re-evaluated:Patient Re-evaluated prior to induction Oxygen Delivery Method: Simple face mask Preoxygenation: Pre-oxygenation with 100% oxygen Induction Type: IV induction Placement Confirmation: CO2 detector Dental Injury: Teeth and Oropharynx as per pre-operative assessment

## 2019-01-20 NOTE — Anesthesia Preprocedure Evaluation (Signed)
Anesthesia Evaluation  Patient identified by MRN, date of birth, ID band Patient awake    Reviewed: Allergy & Precautions, NPO status , Patient's Chart, lab work & pertinent test results  Airway Mallampati: II  TM Distance: >3 FB Neck ROM: Full    Dental no notable dental hx.    Pulmonary neg pulmonary ROS,    Pulmonary exam normal breath sounds clear to auscultation       Cardiovascular negative cardio ROS Normal cardiovascular exam Rhythm:Regular Rate:Normal     Neuro/Psych negative neurological ROS  negative psych ROS   GI/Hepatic negative GI ROS, Neg liver ROS,   Endo/Other  negative endocrine ROS  Renal/GU negative Renal ROS  negative genitourinary   Musculoskeletal  (+) Arthritis , Osteoarthritis,    Abdominal   Peds negative pediatric ROS (+)  Hematology negative hematology ROS (+)   Anesthesia Other Findings   Reproductive/Obstetrics negative OB ROS                             Anesthesia Physical Anesthesia Plan  ASA: II  Anesthesia Plan: Spinal   Post-op Pain Management:    Induction: Intravenous  PONV Risk Score and Plan: 2 and Ondansetron and Dexamethasone  Airway Management Planned: Simple Face Mask  Additional Equipment:   Intra-op Plan:   Post-operative Plan:   Informed Consent: I have reviewed the patients History and Physical, chart, labs and discussed the procedure including the risks, benefits and alternatives for the proposed anesthesia with the patient or authorized representative who has indicated his/her understanding and acceptance.     Dental advisory given  Plan Discussed with: CRNA and Surgeon  Anesthesia Plan Comments:         Anesthesia Quick Evaluation

## 2019-01-21 ENCOUNTER — Encounter (HOSPITAL_COMMUNITY): Payer: Self-pay | Admitting: Orthopedic Surgery

## 2019-01-21 ENCOUNTER — Other Ambulatory Visit: Payer: Self-pay

## 2019-01-21 DIAGNOSIS — M1711 Unilateral primary osteoarthritis, right knee: Secondary | ICD-10-CM | POA: Diagnosis not present

## 2019-01-21 LAB — CBC
HCT: 37.2 % (ref 36.0–46.0)
Hemoglobin: 11.5 g/dL — ABNORMAL LOW (ref 12.0–15.0)
MCH: 30 pg (ref 26.0–34.0)
MCHC: 30.9 g/dL (ref 30.0–36.0)
MCV: 97.1 fL (ref 80.0–100.0)
Platelets: 225 10*3/uL (ref 150–400)
RBC: 3.83 MIL/uL — ABNORMAL LOW (ref 3.87–5.11)
RDW: 13 % (ref 11.5–15.5)
WBC: 11.4 10*3/uL — ABNORMAL HIGH (ref 4.0–10.5)
nRBC: 0 % (ref 0.0–0.2)

## 2019-01-21 NOTE — Anesthesia Postprocedure Evaluation (Signed)
Anesthesia Post Note  Patient: Laurajean Hosek  Procedure(s) Performed: TOTAL KNEE ARTHROPLASTY (Right Knee)     Patient location during evaluation: PACU Anesthesia Type: Spinal Level of consciousness: oriented and awake and alert Pain management: pain level controlled Vital Signs Assessment: post-procedure vital signs reviewed and stable Respiratory status: spontaneous breathing, respiratory function stable and patient connected to nasal cannula oxygen Cardiovascular status: blood pressure returned to baseline and stable Postop Assessment: no headache, no backache and no apparent nausea or vomiting Anesthetic complications: no    Last Vitals:  Vitals:   01/21/19 0131 01/21/19 0606  BP: 106/64 115/69  Pulse: 75 72  Resp: 15 18  Temp:  (!) 36.3 C  SpO2: 98% 96%    Last Pain:  Vitals:   01/21/19 0400  TempSrc:   PainSc: 0-No pain                 Cornelius Schuitema S

## 2019-01-21 NOTE — Progress Notes (Signed)
Patient has had 3 episodes of vomiting today, starting around 1130am. Patient states the nausea started at 11am. Patient is voiding, and previously had tolerated breakfast, and 2 sessions of physical therapy.   Zofran was given at 12noon, patient is still nauseated, and vomits for a third time at 1300.   PA/MD was paged at 1410, to inform him of the vomiting episodes as well as ask for a change in medication.    PA answered the phone, and agreed to change home prescription to tramadol, and add a home prescription for some nausea medication.   Will continue to monitor patient at this time for a possible discharge this afternoon.

## 2019-01-21 NOTE — Care Management Note (Signed)
Case Management Note  Patient Details  Name: Robin Hutchinson MRN: [id number] Date of Birth: May 09, 1946  Subjective/Objective:                  DISCHARGED  Action/Plan: HHC-PT THROUGH [facility] AND 3 IN 1-MEDIEQUIP  Expected Discharge Date:  01/21/19               Expected Discharge Plan:  [facility]  In-House Referral:     Discharge planning Services  CM Consult  Post Acute Care Choice:  Home Health, Durable Medical Equipment Choice offered to:  Patient  DME Arranged:  3-N-1, Walker rolling DME Agency:  Other - Comment  HH Arranged:  PT [place]:  [facility] (now [facility])  Status of Service:  Completed, signed off  If discussed at [employer] of Stay Meetings, dates discussed:    Additional Comments:  Leeroy Cha, RN 01/21/2019, 11:10 AM

## 2019-01-21 NOTE — Evaluation (Signed)
Physical Therapy Evaluation Patient Details Name: Robin Hutchinson MRN: [id number] DOB: Mar 16, 1946 Today's Date: 01/21/2019   History of Present Illness  73 yo female s/p R TKR on 01/20/19. PMH includes OA.   Clinical Impression  Pt presents with mild R knee pain with mobility, decreased R knee ROM, increased time and effort to perform mobility tasks, gait deviations secondary to R TKR, and decreased activity tolerance. Pt to benefit from acute PT to address deficits. Pt ambulated 130 ft total with min guard assist, VC for form and safety provided throughout. Pt performed LE exercises well with min cuing. PT to progress mobility as tolerated, and will continue to follow acutely.        Follow Up Recommendations Follow surgeon's recommendation for DC plan and follow-up therapies;Supervision for mobility/OOB(HHPT)    Equipment Recommendations  Rolling walker with 5" wheels;3in1 (PT)(delivered to pt's room, sized for pt)    Recommendations for Other Services       Precautions / Restrictions Precautions Precautions: Fall Restrictions Weight Bearing Restrictions: No Other Position/Activity Restrictions: WBAT       Mobility  Bed Mobility               General bed mobility comments: Pt up in chair, and went to chair after PT session   Transfers Overall transfer level: Needs assistance Equipment used: Rolling walker (2 wheeled) Transfers: Sit to/from Stand Sit to Stand: Min assist         General transfer comment: Min assist for power up, steadying upon standing. Pt instructed to use UEs from recliner to stand, put most weight on LLE during standing and shift weight to RLE once standing. No instability or difficulty noted.  Ambulation/Gait Ambulation/Gait assistance: Min guard Gait Distance (Feet): 110 Feet(110 ft in hallway + 20 ft to and from bathroom) Assistive device: Rolling walker (2 wheeled) Gait Pattern/deviations: Step-to pattern;Decreased stance time - right;Decreased  weight shift to right;Antalgic;Trunk flexed Gait velocity: decr    General Gait Details: Min guard for safety. Verbal cuing for sequencing, stepping into RW especially when turning, and upright posture.   Stairs            Wheelchair Mobility    Modified Rankin (Stroke Patients Only)       Balance Overall balance assessment: Mild deficits observed, not formally tested                                           Pertinent Vitals/Pain Pain Assessment: 0-10 Pain Score: 2  Pain Location: R knee, with AAROM Pain Descriptors / Indicators: Sore Pain Intervention(s): Limited activity within patient's tolerance;Repositioned;Ice applied;Monitored during session;Premedicated before session    Home Living Family/patient expects to be discharged to:: Private residence Living Arrangements: Spouse/significant other Available Help at Discharge: Family;Available 43 hours/day(63 year old husband lives with her and can assist as needed, son also lives closeby) Type of Home: Other(Comment)(condo ) Home Access: Level entry     Home Layout: One level Home Equipment: Cane - single point      Prior Function Level of Independence: Independent with assistive device(s)         Comments: Pt reports using cane for ambulation PTA, due to R knee pain      Hand Dominance   Dominant Hand: Right    Extremity/Trunk Assessment   Upper Extremity Assessment Upper Extremity Assessment: Overall WFL for tasks assessed  Lower Extremity Assessment Lower Extremity Assessment: Overall WFL for tasks assessed;RLE deficits/detail RLE Deficits / Details: suspected post-surgical weakness; able to perform ankle pumps, quad set, SLR with min lift assist, heel slide with assist  RLE Sensation: WNL    Cervical / Trunk Assessment Cervical / Trunk Assessment: Normal  Communication   Communication: No difficulties  Cognition Arousal/Alertness: Awake/alert Behavior During Therapy:  WFL for tasks assessed/performed Overall Cognitive Status: Within Functional Limits for tasks assessed                                        General Comments      Exercises Total Joint Exercises Ankle Circles/Pumps: AROM;Both;20 reps;Seated Quad Sets: AROM;Right;10 reps;Seated Heel Slides: AAROM;Right;10 reps;Seated Hip ABduction/ADduction: AAROM;Right;10 reps;Seated Goniometric ROM: knee aarom ~5-60*, limited by pain and stiffness   Assessment/Plan    PT Assessment Patient needs continued PT services  PT Problem List Decreased strength;Pain;Decreased range of motion;Decreased activity tolerance;Decreased knowledge of use of DME;Decreased balance;Decreased mobility;Decreased safety awareness       PT Treatment Interventions DME instruction;Therapeutic activities;Gait training;Therapeutic exercise;Patient/family education;Balance training;Functional mobility training    PT Goals (Current goals can be found in the Care Plan section)  Acute Rehab PT Goals Patient Stated Goal: go home PT Goal Formulation: With patient Time For Goal Achievement: 01/28/19 Potential to Achieve Goals: Good    Frequency 7X/week   Barriers to discharge        Co-evaluation               AM-PAC PT "6 Clicks" Mobility  Outcome Measure Help needed turning from your back to your side while in a flat bed without using bedrails?: A Little Help needed moving from lying on your back to sitting on the side of a flat bed without using bedrails?: A Little Help needed moving to and from a bed to a chair (including a wheelchair)?: A Little Help needed standing up from a chair using your arms (e.g., wheelchair or bedside chair)?: A Little Help needed to walk in hospital room?: A Little Help needed climbing 3-5 steps with a railing? : A Little 6 Click Score: 18    End of Session Equipment Utilized During Treatment: Gait belt Activity Tolerance: Patient tolerated treatment  well Patient left: in chair;with call bell/phone within reach Nurse Communication: Mobility status PT Visit Diagnosis: Other abnormalities of gait and mobility (R26.89);Difficulty in walking, not elsewhere classified (R26.2)    Time: 4081-4481 PT Time Calculation (min) (ACUTE ONLY): 31 min   Charges:   PT Evaluation $PT Eval Low Complexity: 1 Low PT Treatments $Gait Training: 8-22 mins        Julien Girt, PT Acute Rehabilitation Services Pager [phone]  Office [phone]   Konor Noren D Elonda Husky 01/21/2019, 11:51 AM

## 2019-01-21 NOTE — Progress Notes (Addendum)
Physical Therapy Treatment Patient Details Name: Robin Hutchinson MRN: [id number] DOB: 02-01-1946 Today's Date: 01/21/2019    History of Present Illness 73 yo female s/p R TKR on 01/20/19. PMH includes OA.     PT Comments    Pt progressing well with gait, and performs LE exercises proficiently. Pt given exercise handout, all exercises reviewed, demonstrated, and practiced. PT reviewed and practiced getting in and out of bed and car with pt/pt's husband. Pt's husband able to assist as needed. PT sized pt's DME, and showed pt and husband how to use [employer].   Update 1705: Pt with N/V x5 this afternoon, pt to d/c tomorrow and would benefit from 1 more PT session in AM before d/c home.    Follow Up Recommendations  Follow surgeon's recommendation for DC plan and follow-up therapies;Supervision for mobility/OOB(HHPT)     Equipment Recommendations  Rolling walker with 5" wheels;3in1 (PT)(delivered to pt's room, sized for pt)    Recommendations for Other Services       Precautions / Restrictions Precautions Precautions: Fall Restrictions Weight Bearing Restrictions: No Other Position/Activity Restrictions: WBAT     Mobility  Bed Mobility Overal bed mobility: Needs Assistance Bed Mobility: Supine to Sit;Sit to Supine     Supine to sit: Supervision Sit to supine: Min assist   General bed mobility comments: Supervision for supine to sit, increased time and effort. Min assist for sit to supine for lifting LEs into bed, husband at bedside and able to assist with leg lifting into bed.   Transfers Overall transfer level: Needs assistance Equipment used: Rolling walker (2 wheeled) Transfers: Sit to/from Stand Sit to Stand: Supervision         General transfer comment: Supervision for safety, min reinforcement for hand placement when rising.   Ambulation/Gait Ambulation/Gait assistance: Supervision Gait Distance (Feet): 150 Feet Assistive device: Rolling walker (2 wheeled) Gait  Pattern/deviations: Step-through pattern;Decreased stride length;Trunk flexed Gait velocity: decr    General Gait Details: Supervision for safety. Progressed pt to step-through gait with normalized heel-toe ambulation. Pt tolerated well, still walking slowly for safety.   Stairs             Wheelchair Mobility    Modified Rankin (Stroke Patients Only)       Balance Overall balance assessment: Mild deficits observed, not formally tested                                          Cognition Arousal/Alertness: Awake/alert Behavior During Therapy: WFL for tasks assessed/performed Overall Cognitive Status: Within Functional Limits for tasks assessed                                        Exercises Total Joint Exercises Ankle Circles/Pumps: AROM;Both;20 reps;Seated Quad Sets: AROM;5 reps;Seated;Right Towel Squeeze: AROM;Both;10 reps;Seated Short Arc Quad: AROM;AAROM;Right;10 reps;Seated Heel Slides: AAROM;Right;10 reps;Seated(with gait belt assist ) Hip ABduction/ADduction: AAROM;Right;10 reps;Seated Straight Leg Raises: AAROM;Right;10 reps;Seated Goniometric ROM: knee aarom ~5-60*, limited by pain and stiffness    General Comments        Pertinent Vitals/Pain Pain Assessment: No/denies pain Pain Score: 2  Pain Location: R knee, with AAROM Pain Descriptors / Indicators: Sore Pain Intervention(s): Limited activity within patient's tolerance;Monitored during session    Home Living Family/patient expects to be discharged to::  Private residence Living Arrangements: Spouse/significant other Available Help at Discharge: Family;Available 55 hours/day(15 year old husband lives with her and can assist as needed, son also lives closeby) Type of Home: Other(Comment)(condo ) Home Access: Level entry   Home Layout: One level Home Equipment: Cane - single point      Prior Function Level of Independence: Independent with assistive device(s)       Comments: Pt reports using cane for ambulation PTA, due to R knee pain    PT Goals (current goals can now be found in the care plan section) Acute Rehab PT Goals Patient Stated Goal: go home PT Goal Formulation: With patient Time For Goal Achievement: 01/28/19 Potential to Achieve Goals: Good Progress towards PT goals: Progressing toward goals    Frequency    7X/week      PT Plan Current plan remains appropriate    Co-evaluation              AM-PAC PT "6 Clicks" Mobility   Outcome Measure  Help needed turning from your back to your side while in a flat bed without using bedrails?: A Little Help needed moving from lying on your back to sitting on the side of a flat bed without using bedrails?: A Little Help needed moving to and from a bed to a chair (including a wheelchair)?: A Little Help needed standing up from a chair using your arms (e.g., wheelchair or bedside chair)?: A Little Help needed to walk in hospital room?: None Help needed climbing 3-5 steps with a railing? : A Little 6 Click Score: 19    End of Session Equipment Utilized During Treatment: Gait belt Activity Tolerance: Patient tolerated treatment well Patient left: in chair;with call bell/phone within reach Nurse Communication: Mobility status PT Visit Diagnosis: Other abnormalities of gait and mobility (R26.89);Difficulty in walking, not elsewhere classified (R26.2)     Time: 1610-9604 PT Time Calculation (min) (ACUTE ONLY): 30 min  Charges:  $Gait Training: 8-22 mins $Therapeutic Exercise: 8-22 mins                    Julien Girt, PT Acute Rehabilitation Services Pager [phone]  Office [phone]   Nabeel Gladson D Elonda Husky 01/21/2019, 12:05 PM

## 2019-01-21 NOTE — Discharge Summary (Signed)
Patient ID: Robin Hutchinson MRN: [id number] DOB/AGE: Jan 16, 1946 73 y.o.  Admit date: 01/20/2019 Discharge date: 01/21/2019  Admission Diagnoses:  Principal Problem:   Primary osteoarthritis of right knee   Discharge Diagnoses:  Same  Past Medical History:  Diagnosis Date  . Arthritis     Surgeries: Procedure(s):Right TOTAL KNEE ARTHROPLASTY on 01/20/2019   Consultants:   Discharged Condition: Improved  Hospital Course: Verina Galeno is an 73 y.o. female who was admitted 01/20/2019 for operative treatment ofPrimary osteoarthritis of right knee. Patient has severe unremitting pain that affects sleep, daily activities, and work/hobbies. After pre-op clearance the patient was taken to the operating room on 01/20/2019 and underwent  Procedure(s):Right TOTAL KNEE ARTHROPLASTY.    Patient was given perioperative antibiotics:  Anti-infectives (From admission, onward)   Start     Dose/Rate Route Frequency Ordered Stop   01/20/19 2030  ceFAZolin (ANCEF) IVPB 2g/100 mL premix     2 g 200 mL/hr over 30 Minutes Intravenous Every 6 hours 01/20/19 1717 01/21/19 0320   01/20/19 1145  ceFAZolin (ANCEF) IVPB 2g/100 mL premix     2 g 200 mL/hr over 30 Minutes Intravenous On call to O.R. 01/20/19 1141 01/20/19 1440       Patient was given sequential compression devices, early ambulation, and chemoprophylaxis to prevent DVT.  Patient benefited maximally from hospital stay and there were no complications.    Recent vital signs:  Patient Vitals for the past 24 hrs:  BP Temp Temp src Pulse Resp SpO2 Height Weight  01/21/19 0606 115/69 (!) 97.4 F (36.3 C) - 72 18 96 % - -  01/21/19 0131 106/64 - - 75 15 98 % - -  01/21/19 0115 101/61 97.6 F (36.4 C) - 72 14 98 % - -  01/20/19 2231 (!) 104/57 97.8 F (36.6 C) - 78 14 97 % - -  01/20/19 2037 114/65 97.9 F (36.6 C) - 80 18 97 % - -  01/20/19 1917 116/63 98 F (36.7 C) Oral 92 16 99 % - -  01/20/19 1822 128/79 98.2 F (36.8 C) Oral 75 16 100 % - -   01/20/19 1719 133/75 (!) 97.4 F (36.3 C) Oral 71 16 100 % - -  01/20/19 1700 127/69 (!) 97.5 F (36.4 C) - 68 15 99 % - -  01/20/19 1645 126/71 - - 67 15 98 % - -  01/20/19 1630 114/70 - - 73 16 98 % - -  01/20/19 1615 118/68 - - 79 14 100 % - -  01/20/19 1612 123/71 (!) 97.5 F (36.4 C) - 74 19 100 % - -  01/20/19 1412 - - - 77 13 100 % - -  01/20/19 1411 123/63 - - 78 12 100 % - -  01/20/19 1410 - - - 75 14 100 % - -  01/20/19 1409 - - - 80 (!) 21 100 % - -  01/20/19 1408 - - - 75 18 100 % - -  01/20/19 1407 - - - 75 12 100 % - -  01/20/19 1406 127/61 - - 74 16 100 % - -  01/20/19 1405 - - - 80 12 100 % - -  01/20/19 1404 - - - 71 12 100 % - -  01/20/19 1403 - - - 77 15 100 % - -  01/20/19 1402 - - - 78 12 100 % - -  01/20/19 1401 132/65 - - 76 15 99 % - -  01/20/19 1400 - - -  74 12 100 % - -  01/20/19 1359 - - - 81 19 100 % - -  01/20/19 1358 - - - 83 16 99 % - -  01/20/19 1357 - - - 78 12 100 % - -  01/20/19 1356 121/64 - - 80 17 99 % - -  01/20/19 1355 - - - 75 14 99 % - -  01/20/19 1354 - - - 74 14 98 % - -  01/20/19 1353 - - - 77 14 99 % - -  01/20/19 1352 - - - 76 12 100 % - -  01/20/19 1351 132/73 - - 83 15 100 % - -  01/20/19 1350 - - - 75 18 100 % - -  01/20/19 1349 - - - 68 12 100 % - -  01/20/19 1348 - - - 71 17 99 % - -  01/20/19 1347 - - - 69 16 100 % - -  01/20/19 1346 (!) 145/69 - - 74 (!) 21 100 % - -  01/20/19 1345 - - - 75 17 100 % - -  01/20/19 1344 - - - 73 (!) 23 100 % - -  01/20/19 1343 - - - 72 17 100 % - -  01/20/19 1342 138/74 - - 80 16 100 % - -  01/20/19 1152 128/73 98.2 F (36.8 C) Oral 87 18 97 % 5\' 2"  (1.575 m) 68.9 kg     Recent laboratory studies:  Recent Labs    01/21/19 0533  WBC 11.4*  HGB 11.5*  HCT 37.2  PLT 225     Discharge Medications:   Allergies as of 01/21/2019      Reactions   Aspirin Nausea Only   Sulfa Antibiotics Rash      Medication List    STOP taking these medications   meloxicam 15 MG  tablet Commonly known as:  MOBIC     TAKE these medications   aspirin EC 325 MG tablet Take 1 tablet (325 mg total) by mouth 2 (two) times daily after a meal. Take x 1 month post op to decrease risk of blood clots.   atorvastatin 10 MG tablet Commonly known as:  LIPITOR Take 10 mg by mouth daily.   docusate sodium 100 MG capsule Commonly known as:  COLACE Take 1 capsule (100 mg total) by mouth 2 (two) times daily.   oxyCODONE-acetaminophen 5-325 MG tablet Commonly known as:  PERCOCET/ROXICET Take 1-2 tablets by mouth every 6 (six) hours as needed for severe pain. Notes to patient:  Narcotic pain medication   pantoprazole 40 MG tablet Commonly known as:  PROTONIX Take 1 tablet (40 mg total) by mouth daily.   tiZANidine 2 MG tablet Commonly known as:  ZANAFLEX Take 1 tablet (2 mg total) by mouth every 8 (eight) hours as needed for muscle spasms. Notes to patient:  Muscle relaxer            Discharge Care Instructions  (From admission, onward)         Start     Ordered   01/21/19 0000  Weight bearing as tolerated    Question Answer Comment  Laterality right   Extremity Lower      01/21/19 0935          Diagnostic Studies: Dg Chest 2 View  Result Date: 01/13/2019 CLINICAL DATA:  Preop knee surgery EXAM: CHEST - 2 VIEW COMPARISON:  None. FINDINGS: The heart size and mediastinal contours are within normal limits. Both lungs  are clear. The visualized skeletal structures are unremarkable. IMPRESSION: No active cardiopulmonary disease. Electronically Signed   By: Kathreen Devoid   On: 01/13/2019 15:03    Disposition: Discharge disposition: 01-Home or Self Care       Discharge Instructions    CPM   Complete by:  As directed    Continuous passive motion machine (CPM):      Use the CPM from 0 to 70 for 8 hours per day.      You may increase by 5-10 per day.  You may break it up into 2 or 3 sessions per day.      Use CPM for 1-2 weeks or until you are told to  stop.   Call MD / Call 911   Complete by:  As directed    If you experience chest pain or shortness of breath, CALL 911 and be transported to the hospital emergency room.  If you develope a fever above 101 F, pus (white drainage) or increased drainage or redness at the wound, or calf pain, call your surgeon's office.   Diet general   Complete by:  As directed    Do not put a pillow under the knee. Place it under the heel.   Complete by:  As directed    Increase activity slowly as tolerated   Complete by:  As directed    Weight bearing as tolerated   Complete by:  As directed    Laterality:  right   Extremity:  Lower      Follow-up Information    Dorna Leitz, MD. Schedule an appointment as soon as possible for a visit in 2 weeks.   Specialty:  Orthopedic Surgery Contact information: [place] [address] [phone]            Signed: Erlene Senters 01/21/2019, 9:35 AM

## 2019-01-21 NOTE — Progress Notes (Signed)
Subjective: 1 Day Post-Op Procedure(s) (LRB): TOTAL KNEE ARTHROPLASTY (Right) Patient reports pain as mild.    Objective: Vital signs in last 24 hours: Temp:  [97.4 F (36.3 C)-98.2 F (36.8 C)] 97.4 F (36.3 C) (02/20 0606) Pulse Rate:  [67-92] 72 (02/20 0606) Resp:  [12-23] 18 (02/20 0606) BP: (101-145)/(57-79) 115/69 (02/20 0606) SpO2:  [96 %-100 %] 96 % (02/20 0606) Weight:  [68.9 kg] 68.9 kg (02/19 1152)  Intake/Output from previous day: 02/19 0701 - 02/20 0700 In: 3686.9 [P.O.:660; I.V.:2526.9; IV Piggyback:500] Out: 2025 [Urine:2025] Intake/Output this shift: No intake/output data recorded.  Recent Labs    01/21/19 0533  HGB 11.5*   Recent Labs    01/21/19 0533  WBC 11.4*  RBC 3.83*  HCT 37.2  PLT 225   No results for input(s): NA, K, CL, CO2, BUN, CREATININE, GLUCOSE, CALCIUM in the last 72 hours. No results for input(s): LABPT, INR in the last 72 hours.  Neurologically intact ABD soft Neurovascular intact Sensation intact distally Intact pulses distally Dorsiflexion/Plantar flexion intact No cellulitis present Compartment soft   Assessment/Plan: 1 Day Post-Op Procedure(s) (LRB): TOTAL KNEE ARTHROPLASTY (Right) Advance diet Up with therapy Discharge home with home health    Patient's anticipated LOS is less than 2 midnights, meeting these requirements: - Younger than 62 - Lives within 1 hour of care - Has a competent adult at home to recover with post-op recover - NO history of  - Chronic pain requiring opiods  - Diabetes  - Coronary Artery Disease  - Heart failure  - Heart attack  - Stroke  - DVT/VTE  - Cardiac arrhythmia  - Respiratory Failure/COPD  - Renal failure  - Anemia  - Advanced Liver disease       Alta Corning 01/21/2019, 9:35 AM

## 2019-01-22 DIAGNOSIS — M1711 Unilateral primary osteoarthritis, right knee: Secondary | ICD-10-CM | POA: Diagnosis not present

## 2019-01-22 LAB — CBC
HCT: 34 % — ABNORMAL LOW (ref 36.0–46.0)
Hemoglobin: 10.6 g/dL — ABNORMAL LOW (ref 12.0–15.0)
MCH: 29.9 pg (ref 26.0–34.0)
MCHC: 31.2 g/dL (ref 30.0–36.0)
MCV: 96 fL (ref 80.0–100.0)
Platelets: 215 10*3/uL (ref 150–400)
RBC: 3.54 MIL/uL — ABNORMAL LOW (ref 3.87–5.11)
RDW: 13.2 % (ref 11.5–15.5)
WBC: 11.6 10*3/uL — ABNORMAL HIGH (ref 4.0–10.5)
nRBC: 0 % (ref 0.0–0.2)

## 2019-01-22 NOTE — Progress Notes (Signed)
Physical Therapy Treatment Patient Details Name: Robin Hutchinson MRN: [id number] DOB: 1946/09/14 Today's Date: 01/22/2019    History of Present Illness 73 yo female s/p R TKR on 01/20/19. PMH includes OA.     PT Comments    POD # 2  am session withheld due to active bleeding distal incision.  Assisted RN with re dressing, compression wrap and apply ICE. Pm session  Assisted out of recliner to amb to bathroom.  Assisted with toilet transfer then amb in hallway.  General transfer comment: 25% VC's on proper hand placement and safety with turns.  Assisted with amb.  General Gait Details: 25% VC's proper walker to self distance and safety with turns using walker.   Addressed all mobility questions, discussed appropriate activity, educated on use of ICE.  Pt ready for D/C to home.    Follow Up Recommendations  Follow surgeon's recommendation for DC plan and follow-up therapies;Supervision for mobility/OOB  Home Health  CPM   Equipment Recommendations  Rolling walker with 5" wheels;3in1 (PT)    Recommendations for Other Services       Precautions / Restrictions Precautions Precautions: Fall Restrictions Weight Bearing Restrictions: No Other Position/Activity Restrictions: WBAT     Mobility  Bed Mobility               General bed mobility comments: OOB in recliner   Transfers Overall transfer level: Needs assistance Equipment used: Rolling walker (2 wheeled) Transfers: Sit to/from [employer] Sit to Stand: Min guard Stand pivot transfers: Supervision;Min guard       General transfer comment: 25% VC's on proper hand placement and safety with turns.  Also assisted with toilet transfer   Ambulation/Gait Ambulation/Gait assistance: Supervision Gait Distance (Feet): 125 Feet Assistive device: Rolling walker (2 wheeled) Gait Pattern/deviations: Step-through pattern;Decreased stride length;Trunk flexed Gait velocity: decreased    General Gait Details: 25%  VC's proper walker to self distance and safety with turns using walker    Stairs Stairs: (no stairs at [employer])           Engineer, building services Rankin (Stroke Patients Only)       Balance                                            Cognition Arousal/Alertness: Awake/alert Behavior During Therapy: WFL for tasks assessed/performed Overall Cognitive Status: Within Functional Limits for tasks assessed                                        Exercises      General Comments        Pertinent Vitals/Pain Pain Assessment: 0-10 Pain Score: 4  Pain Location: R knee  Pain Descriptors / Indicators: Sore;Operative site guarding;Discomfort Pain Intervention(s): Monitored during session;Repositioned;Premedicated before session;Ice applied    Home Living                      Prior Function            PT Goals (current goals can now be found in the care plan section) Progress towards PT goals: Progressing toward goals    Frequency    7X/week      PT Plan Current plan remains appropriate    Co-evaluation  AM-PAC PT "6 Clicks" Mobility   Outcome Measure  Help needed turning from your back to your side while in a flat bed without using bedrails?: A Little Help needed moving from lying on your back to sitting on the side of a flat bed without using bedrails?: A Little Help needed moving to and from a bed to a chair (including a wheelchair)?: A Little Help needed standing up from a chair using your arms (e.g., wheelchair or bedside chair)?: A Little Help needed to walk in hospital room?: A Little Help needed climbing 3-5 steps with a railing? : A Little 6 Click Score: 18    End of Session Equipment Utilized During Treatment: Gait belt Activity Tolerance: Patient tolerated treatment well Patient left: in chair;with call bell/phone within reach Nurse Communication: Mobility status(pt ready for D/C  to home ) PT Visit Diagnosis: Other abnormalities of gait and mobility (R26.89);Difficulty in walking, not elsewhere classified (R26.2)     Time: 9450-3888 PT Time Calculation (min) (ACUTE ONLY): 36 min  Charges:  $Gait Training: 8-22 mins $Therapeutic Activity: 8-22 mins                     Rica Koyanagi  PTA Acute  Rehabilitation Services Pager      [phone] Office      [phone]

## 2019-01-22 NOTE — Brief Op Note (Signed)
01/20/2019  8:50 AM  PATIENT:  Robin Hutchinson  73 y.o. female  PRE-OPERATIVE DIAGNOSIS:  RIGHT KNEE OSTEOARTHRITIS  POST-OPERATIVE DIAGNOSIS:  RIGHT KNEE OSTEOARTHRITIS  PROCEDURE:  Procedure(s): TOTAL KNEE ARTHROPLASTY (Right)  SURGEON:  Surgeon(s) and Role:    Dorna Leitz, MD - Primary  PHYSICIAN ASSISTANT:   ASSISTANTS: bethune   ANESTHESIA:   spinal  EBL:  none   BLOOD ADMINISTERED:none  DRAINS: none   LOCAL MEDICATIONS USED:  MARCAINE    and OTHER experel  SPECIMEN:  No Specimen  DISPOSITION OF SPECIMEN:  N/A  COUNTS:  YES  TOURNIQUET:   Total Tourniquet Time Documented: Thigh (Right) - 42 minutes Total: Thigh (Right) - 42 minutes   DICTATION: .Other Dictation: Dictation Number [id number]  PLAN OF CARE: Admit to inpatient   PATIENT DISPOSITION:  PACU - hemodynamically stable.   Delay start of Pharmacological VTE agent (>24hrs) due to surgical blood loss or risk of bleeding: no

## 2019-01-22 NOTE — Progress Notes (Signed)
Patient discharged to home with husband. Given all belongings, instructions, prescriptions. Verbalized understanding of instructions. Escorted to pov via w/c.

## 2019-01-22 NOTE — Op Note (Signed)
Robin Hutchinson, Robin Hutchinson MEDICAL RECORD [id number] ACCOUNT [id number] DATE OF BIRTH:1946/08/08 FACILITY: [facility] LOCATION: [facility] PHYSICIAN:Shon Indelicato L. Orvel Cutsforth, MD  OPERATIVE REPORT  DATE OF PROCEDURE:  01/20/2019  PREOPERATIVE DIAGNOSIS:  End-stage degenerative joint disease, right knee.  POSTOPERATIVE DIAGNOSIS:  End-stage degenerative joint disease, right knee.  PROCEDURE:  Right total knee replacement with an Attune system, size 5 femur, size 5 tibia, 8 mm bridging bearing, and a 38 mm all polyethylene patella.  SURGEON:  Dorna Leitz, MD  ASSISTANT:  Gaspar Skeeters, PA-C, was present for the entire case and assisted by bone cuts, retraction, and closing to minimize OR time.  BRIEF HISTORY:  The patient is a 73 year old female with a long history of significant complaints of right knee pain.  She has been treated conservatively for a long period of time.  She had x-rays showing bone-on-bone changes, having night pain and  light activity pain.  After failure of all conservative care including injection therapy, viscosupplementation and activity modification, she was taken to the operating room for right total knee replacement.  DESCRIPTION OF PROCEDURE:  The patient was taken to the operating room.  After spinal anesthetic was obtained, the patient was placed supine on the operating table.  Following this, the leg was exsanguinated and blood pressure tourniquet inflated to 300  mmHg.  At this point, attention was turned to the right leg where after routine prep and drape, an incision was made for an anterior approach to the knee.  Subcutaneous tissue down to the level of extensor mechanism and a medial parapatellar arthrotomy  was undertaken.  Following this, medial and lateral meniscus were removed, retropatellar fat pad, the synovium over the anterior aspect of the femur, and anterior and posterior cruciates.  Intramedullary pilot hole was drilled, and a 4-degree valgus  inclination cut was made with 9  mm distal bone resected.  Attention was then turned towards sizing.  It sized to a 5.  Anterior and posterior cuts were made, chamfers and box.  Attention was then turned towards the tibia.  It was cut perpendicular to its  long axis with a 3-degree posterior slope.  Attention was then turned to the tibia, sized to a 5.  It was drilled and keeled, and an 8 mm spacer block was put in place.  Excellent range of motion and stability were achieved.  Attention was then turned  to the patella.  It was cut down to the level of 13 mm, and a 38 paddle was chosen, lugs were drilled, and the patella was put in place.  Excellent range of motion and stability were achieved.  The trial components were then all removed.  The knee was  copiously and thoroughly lavaged with pulsatile lavage irrigation and suctioned dry.  The final components were then cemented into place, size 5 femur, size 5 tibia, 8 mm bridging bearing, and a 38 mm all polyethylene patella.  At this point, all  bleeders were controlled with electrocautery after the tourniquet was let down after the cement had completely hardened.  The excess bone cement was removed.  The final poly was opened and placed, and at this point, the medial parapatellar arthrotomy was  closed with 1 Vicryl running, skin with 0 and 2-0 Vicryl and 3-0 Monocryl subcuticular.  Benzoin and Steri-Strips applied.  A sterile compressive dressing was applied.  The patient was taken to recovery and was noted to be in satisfactory condition.   Estimated blood loss for procedure was minimal.  LN/NUANCE  D:01/22/2019 T:01/22/2019  JOB:[id number]

## 2019-02-05 ENCOUNTER — Ambulatory Visit: Payer: Medicare Other | Attending: Orthopedic Surgery | Admitting: Physical Therapy

## 2019-02-05 ENCOUNTER — Other Ambulatory Visit: Payer: Self-pay

## 2019-02-05 DIAGNOSIS — M25561 Pain in right knee: Secondary | ICD-10-CM | POA: Insufficient documentation

## 2019-02-05 DIAGNOSIS — R600 Localized edema: Secondary | ICD-10-CM | POA: Diagnosis present

## 2019-02-05 DIAGNOSIS — R262 Difficulty in walking, not elsewhere classified: Secondary | ICD-10-CM | POA: Diagnosis present

## 2019-02-05 DIAGNOSIS — M25661 Stiffness of right knee, not elsewhere classified: Secondary | ICD-10-CM | POA: Diagnosis present

## 2019-02-05 NOTE — Therapy (Signed)
[place], [place], [address] Phone: [phone]   Fax:  [phone]  Physical Therapy Evaluation  Patient Details  Name: Robin Hutchinson MRN: [id number] Date of Birth: 1945/12/05 Referring Provider (PT): Dorna Leitz MD   Encounter Date: 02/05/2019  PT End of Session - 02/05/19 0758    Visit Number  1    Date for PT Re-Evaluation  04/02/19    PT Start Time  0800    PT Stop Time  0854    PT Time Calculation (min)  54 min    Activity Tolerance  Patient tolerated treatment well    Behavior During Therapy  [facility] for tasks assessed/performed       Past Medical History:  Diagnosis Date  . Arthritis     Past Surgical History:  Procedure Laterality Date  . CESAREAN SECTION    . TOTAL KNEE ARTHROPLASTY Right 01/20/2019   Procedure: TOTAL KNEE ARTHROPLASTY;  Surgeon: Dorna Leitz, MD;  Location: [facility];  Service: Orthopedics;  Laterality: Right;    There were no vitals filed for this visit.   Subjective Assessment - 02/05/19 0808    Subjective  Patient had right TKR on 01/20/19. She no c/o soreness in post lower leg and pain that wakes her at night. She is still using CPM 3x/day.    Pertinent History  arthritis    How long can you sit comfortably?  30 min    How long can you walk comfortably?  10 min    Patient Stated Goals  to get rid of walker and get back to normal    Currently in Pain?  Yes    Pain Score  3     Pain Location  Knee    Pain Orientation  Right    Pain Descriptors / Indicators  Sore    Pain Type  Surgical pain    Pain Radiating Towards  lower leg and thigh    Pain Onset  1 to 4 weeks ago    Pain Frequency  Constant    Aggravating Factors   exercise    Pain Relieving Factors  ice    Effect of Pain on Daily Activities  limited         [facility] PT Assessment - 02/05/19 0001      Assessment   Medical Diagnosis  s/p right TKR    Referring Provider (PT)  Dorna Leitz MD    Onset  Date/Surgical Date  01/20/19    Next MD Visit  02/17/19    Prior Therapy  6 HHPT      Precautions   Precautions  None      Restrictions   Weight Bearing Restrictions  No      Balance Screen   Has the patient fallen in the past 6 months  No    Has the patient had a decrease in activity level because of a fear of falling?   No    Is the patient reluctant to leave their home because of a fear of falling?   No      Home Environment   Living Environment  Private residence    Living Arrangements  Spouse/significant other    Type of [place] Access  Level entry    [place]  One level      Observation/Other Assessments-Edema    Edema  Circumferential      Circumferential Edema  Circumferential - Right  47 cm    Circumferential - Left   43 cm      ROM / Strength   AROM / PROM / Strength  AROM;PROM;Strength      AROM   AROM Assessment Site  Knee    Right/Left Knee  Right    Right Knee Extension  -5    Right Knee Flexion  70      PROM   PROM Assessment Site  Knee    Right/Left Knee  Right    Right Knee Extension  0    Right Knee Flexion  110      Strength   Overall Strength Comments  Bil hip flex 4-/5, Left knee 5/5, Bil ankle DF 5/5    Strength Assessment Site  Knee    Right/Left Knee  Right    Right Knee Flexion  4-/5    Right Knee Extension  4+/5   in available range     Palpation   Patella mobility  left WNL    Palpation comment  tender around left knee and calf      Ambulation/Gait   Gait Comments  amb with normal gait pattern in RW                Objective measurements completed on examination: See above findings.      [facility] Adult PT Treatment/Exercise - 02/05/19 0001      Modalities   Modalities  Vasopneumatic      Vasopneumatic   Number Minutes Vasopneumatic   15 minutes    Vasopnuematic Location   Knee    Vasopneumatic Pressure  Medium    Vasopneumatic Temperature   34             PT Education - 02/05/19 1058     Education Details  HEP    Person(s) Educated  Patient    Methods  Explanation;Demonstration;Handout   EMAILED PRINTER NOT WORKING   Comprehension  Verbalized understanding;Returned demonstration       PT Short Term Goals - 02/05/19 1107      PT SHORT TERM GOAL #1   Title  Ind with initial HEP    Time  2    Period  Weeks    Status  New    Target Date  02/19/19      PT SHORT TERM GOAL #2   Title  Patient able to ambulate 300 ft safely with SPC.    Time  3    Period  Weeks    Status  New    Target Date  02/26/19        PT Long Term Goals - 02/05/19 1108      PT LONG TERM GOAL #1   Title  Patient to demo right knee ROM 0-115 deg to normalize ADLS.    Time  8    Period  Weeks    Status  New    Target Date  04/02/19      PT LONG TERM GOAL #2   Title  Patient to demo 5/5 RLE strength to normalize ADLS    Time  8    Period  Weeks    Status  New      PT LONG TERM GOAL #3   Title  Patient able to perform ADLs with 1/10 pain or less in right knee.    Time  8    Period  Weeks    Status  New  PT LONG TERM GOAL #4   Title  Patient to ambulate community distances with a normal gait pattern without AD.    Time  8    Period  Weeks    Status  New      PT LONG TERM GOAL #5   Title  Patient able to sleep without waking from pain    Time  8    Period  Weeks    Status  New             Plan - 02/05/19 1058    Clinical Impression Statement  Patient presents s/p right TKR on 01/20/19. She amb with a RW with a normal gait pattern. She has significant strength and ROM deficits in flexion and extension (5-70 deg active flex/ 0-110 passive) and is still using the CPM machine at home. She has pain with active movement and has difficulty sleeping due to pain.     Personal Factors and Comorbidities  Age    Examination-Activity Limitations  Bend;Sleep    Examination-Participation Restrictions  Cleaning;Meal Prep    Stability/Clinical Decision Making  Stable/Uncomplicated     Clinical Decision Making  Low    Rehab Potential  Excellent    PT Frequency  3x / week    PT Duration  8 weeks    PT Treatment/Interventions  ADLs/Self Care Home Management;Cryotherapy;Dentist;Therapeutic exercise;Balance training;Neuromuscular re-education;Patient/family education;Manual techniques;Passive range of motion;Vasopneumatic Device;Taping    PT Next Visit Plan  ROM/strengthening; edema/pain managment    PT Home Exercise Plan  SAQ, SLR, knee extension stretch on chair, heel slides    Consulted and Agree with Plan of Care  Patient       Patient will benefit from skilled therapeutic intervention in order to improve the following deficits and impairments:  Pain, Decreased activity tolerance, Decreased range of motion, Decreased strength, Difficulty walking, Increased edema  Visit Diagnosis: Stiffness of right knee, not elsewhere classified - Plan: PT plan of care cert/re-cert  Acute pain of right knee - Plan: PT plan of care cert/re-cert  Localized edema - Plan: PT plan of care cert/re-cert     Problem List Patient Active Problem List   Diagnosis Date Noted  . Primary osteoarthritis of right knee 01/20/2019    Madelyn Flavors PT 02/05/2019, 11:14 AM  [place], [place], [address] Phone: [phone]   Fax:  [phone]  Name: Moniqua Engebretsen MRN: [id number] Date of Birth: 08-Oct-1946

## 2019-02-05 NOTE — Patient Instructions (Signed)
Access Code: [email or web address]  URL: [email or web address]  Date: 02/05/2019  Prepared by: Madelyn Flavors   Exercises  Supine Short Arc Quad - 10 reps - 3 sets - 5 sec hold - 3x daily - 7x weekly  Seated Passive Knee Extension - 1 reps - 1 sets - 5 min or longer hold - 4x daily - 7x weekly  Supine Straight Leg Raises - 10 reps - 3 sets - 3x daily - 7x weekly  Supine Heel Slide with Strap - 10 reps - 3 sets - 3x daily - 7x weekly

## 2019-02-09 ENCOUNTER — Ambulatory Visit: Payer: Medicare Other | Admitting: Physical Therapy

## 2019-02-09 DIAGNOSIS — R600 Localized edema: Secondary | ICD-10-CM

## 2019-02-09 DIAGNOSIS — M25561 Pain in right knee: Secondary | ICD-10-CM

## 2019-02-09 DIAGNOSIS — M25661 Stiffness of right knee, not elsewhere classified: Secondary | ICD-10-CM

## 2019-02-09 NOTE — Therapy (Signed)
[place], [place], [address] Phone: [phone]   Fax:  [phone]  Physical Therapy Treatment  Patient Details  Name: Robin Hutchinson MRN: [id number] Date of Birth: 1946-11-30 Referring Provider (PT): Dorna Leitz MD   Encounter Date: 02/09/2019  PT End of Session - 02/09/19 0847    Visit Number  2    Date for PT Re-Evaluation  04/02/19    PT Start Time  0845    PT Stop Time  0942    PT Time Calculation (min)  57 min    Activity Tolerance  Patient tolerated treatment well    Behavior During Therapy  [facility] for tasks assessed/performed       Past Medical History:  Diagnosis Date  . Arthritis     Past Surgical History:  Procedure Laterality Date  . CESAREAN SECTION    . TOTAL KNEE ARTHROPLASTY Right 01/20/2019   Procedure: TOTAL KNEE ARTHROPLASTY;  Surgeon: Dorna Leitz, MD;  Location: [facility];  Service: Orthopedics;  Laterality: Right;    There were no vitals filed for this visit.  Subjective Assessment - 02/09/19 0847    Subjective  Patient continues to report greatest pain at night.    How long can you walk comfortably?  10 min    Patient Stated Goals  to get rid of walker and get back to normal    Currently in Pain?  Yes    Pain Score  3     Pain Location  Knee    Pain Orientation  Right    Pain Type  Surgical pain    Pain Onset  1 to 4 weeks ago    Pain Frequency  Constant         [facility] PT Assessment - 02/09/19 0001      AROM   Right Knee Flexion  105                   [facility] Adult PT Treatment/Exercise - 02/09/19 0001      Exercises   Exercises  Knee/Hip      Knee/Hip Exercises: Aerobic   Recumbent Bike  partial revolutions x 5 min    Nustep  L2 x 5 min seat 6 to 5   for ROM     Knee/Hip Exercises: Supine   Quad Sets  Strengthening;Right;1 set;10 reps   10 sec hold   Short Arc [employer]  Strengthening;Right;2 sets;10 reps   10 sec    Heel Slides   Strengthening;Right;15 reps   with overpressure   Straight Leg Raises  Strengthening;Right;20 reps    Straight Leg Raises Limitations  quad lag as fatigues    Patellar Mobs  WNL    Other Supine Knee/Hip Exercises  hip adduction 5 sec hold x 15      Modalities   Modalities  Electrical Stimulation;Vasopneumatic      Electrical Stimulation   Electrical Stimulation Location  right knee    Electrical Stimulation Action  IFC    Electrical Stimulation Parameters  supine    Electrical Stimulation Goals  Pain      Vasopneumatic   Number Minutes Vasopneumatic   15 minutes    Vasopnuematic Location   Knee    Vasopneumatic Pressure  Medium    Vasopneumatic Temperature   34      Manual Therapy   Manual Therapy  Passive ROM    Passive ROM  right knee into flex/ext  PT Short Term Goals - 02/05/19 1107      PT SHORT TERM GOAL #1   Title  Ind with initial HEP    Time  2    Period  Weeks    Status  New    Target Date  02/19/19      PT SHORT TERM GOAL #2   Title  Patient able to ambulate 300 ft safely with SPC.    Time  3    Period  Weeks    Status  New    Target Date  02/26/19        PT Long Term Goals - 02/05/19 1108      PT LONG TERM GOAL #1   Title  Patient to demo right knee ROM 0-115 deg to normalize ADLS.    Time  8    Period  Weeks    Status  New    Target Date  04/02/19      PT LONG TERM GOAL #2   Title  Patient to demo 5/5 RLE strength to normalize ADLS    Time  8    Period  Weeks    Status  New      PT LONG TERM GOAL #3   Title  Patient able to perform ADLs with 1/10 pain or less in right knee.    Time  8    Period  Weeks    Status  New      PT LONG TERM GOAL #4   Title  Patient to ambulate community distances with a normal gait pattern without AD.    Time  8    Period  Weeks    Status  New      PT LONG TERM GOAL #5   Title  Patient able to sleep without waking from pain    Time  8    Period  Weeks    Status  New             Plan - 02/09/19 1224    Clinical Impression Statement  Patient did very well with TE today and has already improved with active flexion to 105 deg. She cannot do full revolution on bike yet. She also fatigues easily with SLR showing quad lag intermittently. Improved quad set contraction today.    PT Treatment/Interventions  ADLs/Self Care Home Management;Cryotherapy;Dentist;Therapeutic exercise;Balance training;Neuromuscular re-education;Patient/family education;Manual techniques;Passive range of motion;Vasopneumatic Device;Taping    PT Next Visit Plan  ROM/strengthening; gait; edema/pain managment; show prone knee hang for extension       Patient will benefit from skilled therapeutic intervention in order to improve the following deficits and impairments:  Pain, Decreased activity tolerance, Decreased range of motion, Decreased strength, Difficulty walking, Increased edema  Visit Diagnosis: Stiffness of right knee, not elsewhere classified  Acute pain of right knee  Localized edema     Problem List Patient Active Problem List   Diagnosis Date Noted  . Primary osteoarthritis of right knee 01/20/2019    Madelyn Flavors PT 02/09/2019, 12:26 PM  [place], [place], [address] Phone: [phone]   Fax:  [phone]  Name: Robin Hutchinson MRN: [id number] Date of Birth: 1946-04-09

## 2019-02-12 ENCOUNTER — Encounter: Payer: Self-pay | Admitting: Physical Therapy

## 2019-02-12 ENCOUNTER — Ambulatory Visit: Payer: Medicare Other | Admitting: Physical Therapy

## 2019-02-12 ENCOUNTER — Other Ambulatory Visit: Payer: Self-pay

## 2019-02-12 DIAGNOSIS — M25561 Pain in right knee: Secondary | ICD-10-CM

## 2019-02-12 DIAGNOSIS — M25661 Stiffness of right knee, not elsewhere classified: Secondary | ICD-10-CM

## 2019-02-12 DIAGNOSIS — R600 Localized edema: Secondary | ICD-10-CM

## 2019-02-12 NOTE — Therapy (Signed)
[place], [place], [address] Phone: [phone]   Fax:  [phone]  Physical Therapy Treatment  Patient Details  Name: Robin Hutchinson MRN: [id number] Date of Birth: 25-Jun-1946 Referring Provider (PT): Dorna Leitz MD   Encounter Date: 02/12/2019  PT End of Session - 02/12/19 0945    Visit Number  3    Date for PT Re-Evaluation  04/02/19    PT Start Time  0845    PT Stop Time  0950    PT Time Calculation (min)  65 min    Activity Tolerance  Patient tolerated treatment well    Behavior During Therapy  [facility] for tasks assessed/performed       Past Medical History:  Diagnosis Date   Arthritis     Past Surgical History:  Procedure Laterality Date   CESAREAN SECTION     TOTAL KNEE ARTHROPLASTY Right 01/20/2019   Procedure: TOTAL KNEE ARTHROPLASTY;  Surgeon: Dorna Leitz, MD;  Location: [facility];  Service: Orthopedics;  Laterality: Right;    There were no vitals filed for this visit.  Subjective Assessment - 02/12/19 0916    Subjective  Most painful in posterior knee and popliteal space at night while in bed.  Knee flexion has improved with less end ROM pain.  Using RW primarily when out in the community.      Aggravating Factors   laying supine, end ROM flexion                       [facility] Adult PT Treatment/Exercise - 02/12/19 0001      Ambulation/Gait   Ambulation/Gait  Yes    Ambulation/Gait Assistance  6: Modified independent (Device/Increase time)    Ambulation Distance (Feet)  300 Feet    Assistive device  Straight cane    Gait Pattern  Step-through pattern;Decreased stride length    Ambulation Surface  Indoor;Level    Gait velocity  slow      Knee/Hip Exercises: Aerobic   Recumbent Bike  full revolutions x 6'      Knee/Hip Exercises: Standing   Terminal Knee Extension  Strengthening;Right;1 set;15 reps    Other Standing Knee Exercises  calf stretch 3x30"      Knee/Hip Exercises: Seated   Long Arc Quad  Strengthening;Right;2 sets;Weights    Long Arc Quad Weight  4 lbs.    Hamstring Curl  Strengthening;Right;2 sets;10 reps   red t band   Sit to Sand  2 sets;10 reps;with UE support      Knee/Hip Exercises: Supine   Short Arc Quad Sets  Strengthening;Right;2 sets;10 reps    Heel Slides  Strengthening;Right;15 reps    Heel Prop for Knee Extension  2 minutes      Electrical Stimulation   Electrical Stimulation Location  right knee    Electrical Stimulation Action  --   IFC   Electrical Stimulation Parameters  supine    Electrical Stimulation Goals  Pain      Manual Therapy   Manual Therapy  Passive ROM    Passive ROM  right knee into flex/ext               PT Short Term Goals - 02/05/19 1107      PT SHORT TERM GOAL #1   Title  Ind with initial HEP    Time  2    Period  Weeks    Status  New  Target Date  02/19/19      PT SHORT TERM GOAL #2   Title  Patient able to ambulate 300 ft safely with SPC.    Time  3    Period  Weeks    Status  New    Target Date  02/26/19        PT Long Term Goals - 02/05/19 1108      PT LONG TERM GOAL #1   Title  Patient to demo right knee ROM 0-115 deg to normalize ADLS.    Time  8    Period  Weeks    Status  New    Target Date  04/02/19      PT LONG TERM GOAL #2   Title  Patient to demo 5/5 RLE strength to normalize ADLS    Time  8    Period  Weeks    Status  New      PT LONG TERM GOAL #3   Title  Patient able to perform ADLs with 1/10 pain or less in right knee.    Time  8    Period  Weeks    Status  New      PT LONG TERM GOAL #4   Title  Patient to ambulate community distances with a normal gait pattern without AD.    Time  8    Period  Weeks    Status  New      PT LONG TERM GOAL #5   Title  Patient able to sleep without waking from pain    Time  8    Period  Weeks    Status  New            Plan - 02/12/19 0946    Clinical Impression Statement  Excellent  flex> ext ROM gains.  Able to safely ambulate with SPC w/ slow steady cadence.  Able to perform full revolutions on rec bike after a few 1/2 revolutions to get started.  Cont with quad weakness that makes [place] tke more difficult. Does not make gait unsafe..  Will need cont improvement in strength and ROM to restore PLOF       Patient will benefit from skilled therapeutic intervention in order to improve the following deficits and impairments:     Visit Diagnosis: Stiffness of right knee, not elsewhere classified  Acute pain of right knee  Localized edema     Problem List Patient Active Problem List   Diagnosis Date Noted   Primary osteoarthritis of right knee 01/20/2019    Olean Ree, PTA 02/12/2019, 9:50 AM  [place], [place], [address] Phone: [phone]   Fax:  [phone]  Name: Robin Hutchinson MRN: [id number] Date of Birth: September 13, 1946

## 2019-02-15 ENCOUNTER — Other Ambulatory Visit: Payer: Self-pay

## 2019-02-15 ENCOUNTER — Ambulatory Visit: Payer: Medicare Other | Admitting: Physical Therapy

## 2019-02-15 ENCOUNTER — Encounter: Payer: Self-pay | Admitting: Physical Therapy

## 2019-02-15 DIAGNOSIS — M25661 Stiffness of right knee, not elsewhere classified: Secondary | ICD-10-CM | POA: Diagnosis not present

## 2019-02-15 DIAGNOSIS — R600 Localized edema: Secondary | ICD-10-CM

## 2019-02-15 DIAGNOSIS — M25561 Pain in right knee: Secondary | ICD-10-CM

## 2019-02-15 NOTE — Therapy (Signed)
[place], [place], [address] Phone: [phone]   Fax:  [phone]  Physical Therapy Treatment  Patient Details  Name: Robin Hutchinson MRN: [id number] Date of Birth: [birth year] Referring Provider (PT): Dorna Leitz MD   Encounter Date: 02/15/2019  PT End of Session - 02/15/19 0926    Visit Number  4    Date for PT Re-Evaluation  04/02/19    PT Start Time  0845    PT Stop Time  0939    PT Time Calculation (min)  54 min    Activity Tolerance  Patient tolerated treatment well    Behavior During Therapy  [facility] for tasks assessed/performed       Past Medical History:  Diagnosis Date  . Arthritis     Past Surgical History:  Procedure Laterality Date  . CESAREAN SECTION    . TOTAL KNEE ARTHROPLASTY Right 01/20/2019   Procedure: TOTAL KNEE ARTHROPLASTY;  Surgeon: Dorna Leitz, MD;  Location: [facility];  Service: Orthopedics;  Laterality: Right;    There were no vitals filed for this visit.  Subjective Assessment - 02/15/19 0848    Subjective  "Doing better, but I wish more better"    Currently in Pain?  No/denies    Pain Score  0-No pain    Pain Location  Knee    Pain Orientation  Right    Pain Descriptors / Indicators  Sore                       [facility] Adult PT Treatment/Exercise - 02/15/19 0001      Knee/Hip Exercises: Aerobic   Recumbent Bike  full revolutions x 3'    Nustep  L3 X 5 min       Knee/Hip Exercises: Seated   Long Arc Quad  Strengthening;Right;2 sets;Weights;10 reps    Long Arc Quad Weight  2 lbs.    Other Seated Knee/Hip Exercises  Quad Sets R 2x10     Hamstring Curl  Strengthening;Right;2 sets;10 reps    Hamstring Limitations  red Tband     Sit to Sand  2 sets;10 reps;without UE support   Lowered UBE seat      Vasopneumatic   Number Minutes Vasopneumatic   15 minutes    Vasopnuematic Location   Knee    Vasopneumatic Pressure  Medium    Vasopneumatic  Temperature   34      Manual Therapy   Manual Therapy  Passive ROM    Passive ROM  right knee into flex/ext               PT Short Term Goals - 02/15/19 0930      PT SHORT TERM GOAL #1   Title  Ind with initial HEP    Status  Achieved        PT Long Term Goals - 02/05/19 1108      PT LONG TERM GOAL #1   Title  Patient to demo right knee ROM 0-115 deg to normalize ADLS.    Time  8    Period  Weeks    Status  New    Target Date  04/02/19      PT LONG TERM GOAL #2   Title  Patient to demo 5/5 RLE strength to normalize ADLS    Time  8    Period  Weeks    Status  New  PT LONG TERM GOAL #3   Title  Patient able to perform ADLs with 1/10 pain or less in right knee.    Time  8    Period  Weeks    Status  New      PT LONG TERM GOAL #4   Title  Patient to ambulate community distances with a normal gait pattern without AD.    Time  8    Period  Weeks    Status  New      PT LONG TERM GOAL #5   Title  Patient able to sleep without waking from pain    Time  8    Period  Weeks    Status  New            Plan - 02/15/19 8811    Clinical Impression Statement  Cues needed for TKE during quad sets. Flex ROM is good with soft end feel, she is only limited by pain. Progressed to LE press and SL resisted exercises. Cues to keep feel flat on platform and to complete full extension. Ambulated with a flex R knee.    Personal Factors and Comorbidities  Age    Examination-Activity Limitations  Bend;Sleep    Examination-Participation Restrictions  Cleaning;Meal Prep    Rehab Potential  Excellent    PT Frequency  3x / week    PT Duration  8 weeks    PT Treatment/Interventions  ADLs/Self Care Home Management;Cryotherapy;Dentist;Therapeutic exercise;Balance training;Neuromuscular re-education;Patient/family education;Manual techniques;Passive range of motion;Vasopneumatic Device;Taping    PT Next Visit Plan   ROM/strengthening; gait; edema/pain managment; show prone knee hang for extension       Patient will benefit from skilled therapeutic intervention in order to improve the following deficits and impairments:     Visit Diagnosis: Localized edema  Acute pain of right knee  Stiffness of right knee, not elsewhere classified     Problem List Patient Active Problem List   Diagnosis Date Noted  . Primary osteoarthritis of right knee 01/20/2019    Scot Jun 02/15/2019, 9:30 AM  [place], [place], [address] Phone: [phone]   Fax:  [phone]  Name: Robin Hutchinson MRN: [id number] Date of Birth: 1946-02-02

## 2019-02-18 ENCOUNTER — Other Ambulatory Visit: Payer: Self-pay

## 2019-02-18 ENCOUNTER — Ambulatory Visit: Payer: Medicare Other | Admitting: Physical Therapy

## 2019-02-18 DIAGNOSIS — R600 Localized edema: Secondary | ICD-10-CM

## 2019-02-18 DIAGNOSIS — M25561 Pain in right knee: Secondary | ICD-10-CM

## 2019-02-18 DIAGNOSIS — M25661 Stiffness of right knee, not elsewhere classified: Secondary | ICD-10-CM | POA: Diagnosis not present

## 2019-02-18 NOTE — Therapy (Signed)
[place], [place], [address] Phone: [phone]   Fax:  [phone]  Physical Therapy Treatment  Patient Details  Name: Robin Hutchinson MRN: [id number] Date of Birth: [birth year] Referring Provider (Hutchinson): Dorna Leitz MD   Encounter Date: 02/18/2019  Hutchinson End of Session - 02/18/19 1430    Visit Number  5    Date for Hutchinson Re-Evaluation  04/02/19    Hutchinson Start Time  3428    Hutchinson Stop Time  1524    Hutchinson Time Calculation (min)  56 min    Activity Tolerance  Patient tolerated treatment well    Behavior During Therapy  [facility] for tasks assessed/performed       Past Medical History:  Diagnosis Date  . Arthritis     Past Surgical History:  Procedure Laterality Date  . CESAREAN SECTION    . TOTAL KNEE ARTHROPLASTY Right 01/20/2019   Procedure: TOTAL KNEE ARTHROPLASTY;  Surgeon: Dorna Leitz, MD;  Location: [facility];  Service: Orthopedics;  Laterality: Right;    There were no vitals filed for this visit.  Subjective Assessment - 02/18/19 1430    Subjective  I've been walking with the cane.     Pertinent History  arthritis    How long can you sit comfortably?  30 min    How long can you walk comfortably?  10 min    Patient Stated Goals  to get rid of walker and get back to normal    Currently in Pain?  No/denies         [facility] Hutchinson Assessment - 02/18/19 0001      AROM   Right Knee Flexion  108      PROM   Right Knee Extension  0    Right Knee Flexion  120                   [facility] Adult Hutchinson Treatment/Exercise - 02/18/19 0001      Ambulation/Gait   Ambulation/Gait  Yes    Ambulation/Gait Assistance  6: Modified independent (Device/Increase time)    Ambulation Distance (Feet)  350 Feet    Assistive device  None;Straight cane    Gait Pattern  Step-through pattern    Ambulation Surface  Level;Indoor;Outdoor;Paved    Gait velocity  encouraged quicker pace    Curb  6: Modified independent  (Device/increase time)   CGA     Knee/Hip Exercises: Stretches   Knee: Self-Stretch to increase Flexion  Right    Knee: Self-Stretch Limitations  on 8 inch step x15 with VCs to push furhter      Knee/Hip Exercises: Aerobic   Recumbent Bike  full revolutions x 3'    Nustep  L3 X 6  min moving up to seat 4       Knee/Hip Exercises: Standing   Terminal Knee Extension  Strengthening;Right;4 sets;20 reps;Theraband    Theraband Level (Terminal Knee Extension)  Level 3 (Green)    Terminal Knee Extension Limitations  2 sets with ball      Vasopneumatic   Number Minutes Vasopneumatic   15 minutes    Vasopnuematic Location   Knee    Vasopneumatic Pressure  Medium    Vasopneumatic Temperature   34      Manual Therapy   Manual Therapy  Passive ROM    Passive ROM  Rt knee flex and exension  Hutchinson Short Term Goals - 02/18/19 1633      Hutchinson SHORT TERM GOAL #2   Title  Patient able to ambulate 300 ft safely with SPC.    Time  3    Period  Weeks    Status  Achieved        Hutchinson Long Term Goals - 02/05/19 1108      Hutchinson LONG TERM GOAL #1   Title  Patient to demo right knee ROM 0-115 deg to normalize ADLS.    Time  8    Period  Weeks    Status  New    Target Date  04/02/19      Hutchinson LONG TERM GOAL #2   Title  Patient to demo 5/5 RLE strength to normalize ADLS    Time  8    Period  Weeks    Status  New      Hutchinson LONG TERM GOAL #3   Title  Patient able to perform ADLs with 1/10 pain or less in right knee.    Time  8    Period  Weeks    Status  New      Hutchinson LONG TERM GOAL #4   Title  Patient to ambulate community distances with a normal gait pattern without AD.    Time  8    Period  Weeks    Status  New      Hutchinson LONG TERM GOAL #5   Title  Patient able to sleep without waking from pain    Time  8    Period  Weeks    Status  New            Plan - 02/18/19 1630    Clinical Impression Statement  Patient is progressing well with right knee ROM. Passive is  0-120 deg. Active 3-108 deg. Patient amb with SPC today. Gait is slow but steady. She was encouraged to perform increased DF on right ankle. She managed curb and decline walking outside well with supervision. Patient's HEP was reviewed with her due to 2 week closure.    Rehab Potential  Excellent    Hutchinson Frequency  3x / week    Hutchinson Duration  8 weeks    Hutchinson Treatment/Interventions  ADLs/Self Care Home Management;Cryotherapy;Dentist;Therapeutic exercise;Balance training;Neuromuscular re-education;Patient/family education;Manual techniques;Passive range of motion;Vasopneumatic Device;Taping    Hutchinson Next Visit Plan  ROM/strengthening; gait; edema/pain managment    Hutchinson Home Exercise Plan  SAQ, SLR, knee extension stretch on chair, heel slides, lunging    Consulted and Agree with Plan of Care  Patient       Patient will benefit from skilled therapeutic intervention in order to improve the following deficits and impairments:  Pain, Decreased activity tolerance, Decreased range of motion, Decreased strength, Difficulty walking, Increased edema  Visit Diagnosis: Stiffness of right knee, not elsewhere classified  Acute pain of right knee  Localized edema     Problem List Patient Active Problem List   Diagnosis Date Noted  . Primary osteoarthritis of right knee 01/20/2019    Robin Hutchinson 02/18/2019, 4:35 PM  [place], [place], [address] Phone: [phone]   Fax:  [phone]  Name: Robin Hutchinson MRN: [id number] Date of Birth: 1946/08/01

## 2019-02-22 ENCOUNTER — Ambulatory Visit: Payer: Medicare Other | Admitting: Physical Therapy

## 2019-02-22 ENCOUNTER — Other Ambulatory Visit: Payer: Self-pay

## 2019-02-22 ENCOUNTER — Encounter: Payer: Self-pay | Admitting: Physical Therapy

## 2019-02-22 DIAGNOSIS — M25661 Stiffness of right knee, not elsewhere classified: Secondary | ICD-10-CM

## 2019-02-22 DIAGNOSIS — M25561 Pain in right knee: Secondary | ICD-10-CM

## 2019-02-22 DIAGNOSIS — R600 Localized edema: Secondary | ICD-10-CM

## 2019-02-22 NOTE — Therapy (Signed)
[place], [place], [address] Phone: [phone]   Fax:  [phone]  Physical Therapy Treatment  Patient Details  Name: Delany Steury MRN: [id number] Date of Birth: [birth year] Referring Provider (PT): Dorna Leitz MD   Encounter Date: 02/22/2019  PT End of Session - 02/22/19 1144    Visit Number  6    Date for PT Re-Evaluation  04/02/19    PT Start Time  1100    PT Stop Time  1159    PT Time Calculation (min)  59 min    Activity Tolerance  Patient tolerated treatment well    Behavior During Therapy  [facility] for tasks assessed/performed       Past Medical History:  Diagnosis Date  . Arthritis     Past Surgical History:  Procedure Laterality Date  . CESAREAN SECTION    . TOTAL KNEE ARTHROPLASTY Right 01/20/2019   Procedure: TOTAL KNEE ARTHROPLASTY;  Surgeon: Dorna Leitz, MD;  Location: [facility];  Service: Orthopedics;  Laterality: Right;    There were no vitals filed for this visit.  Subjective Assessment - 02/22/19 1104    Subjective  Not really painful, sore behind the knee and in the muscles with exercise.    Currently in Pain?  No/denies         [facility] PT Assessment - 02/22/19 0001      AROM   Right Knee Extension  9    Right Knee Flexion  104                   [facility] Adult PT Treatment/Exercise - 02/22/19 0001      Knee/Hip Exercises: Aerobic   Recumbent Bike  full revolutions x 4    Nustep  L3 X 6  min moving up to seat 4       Knee/Hip Exercises: Machines for Strengthening   Cybex Knee Extension  5lb 2x10     Cybex Knee Flexion  20lb 2x10     Cybex Leg Press  20lb 2x10       Knee/Hip Exercises: Standing   Heel Raises  Both;2 sets;10 reps;2 seconds      Knee/Hip Exercises: Seated   Long Arc Quad  Strengthening;Right;2 sets;Weights;10 reps    Long Arc Quad Weight  2 lbs.    Other Seated Knee/Hip Exercises  Quad Sets R x10     Hamstring Curl   Strengthening;Right;2 sets;10 reps    Hamstring Limitations  green tband       Vasopneumatic   Number Minutes Vasopneumatic   15 minutes    Vasopnuematic Location   Knee    Vasopneumatic Pressure  Medium    Vasopneumatic Temperature   34               PT Short Term Goals - 02/18/19 1633      PT SHORT TERM GOAL #2   Title  Patient able to ambulate 300 ft safely with SPC.    Time  3    Period  Weeks    Status  Achieved        PT Long Term Goals - 02/22/19 1152      PT LONG TERM GOAL #1   Title  Patient to demo right knee ROM 0-115 deg to normalize ADLS.    Status  Partially Met      PT LONG TERM GOAL #2   Title  Patient to demo 5/5 RLE strength  to normalize ADLS    Status  On-going      PT LONG TERM GOAL #3   Title  Patient able to perform ADLs with 1/10 pain or less in right knee.    Status  Partially Met      PT LONG TERM GOAL #4   Title  Patient to ambulate community distances with a normal gait pattern without AD.    Status  On-going            Plan - 02/22/19 1145    Clinical Impression Statement  Pt continues to do well in therapy. Introduced Art gallery manager. Pt hesitant initially with the new interventions but completed them well. Reports less stiffness after seated leg curls and extensions. Ambulate din clinic with SPC, R knee slightly flexed with ambulation.     Rehab Potential  Excellent    PT Frequency  3x / week    PT Duration  8 weeks    PT Treatment/Interventions  ADLs/Self Care Home Management;Cryotherapy;Dentist;Therapeutic exercise;Balance training;Neuromuscular re-education;Patient/family education;Manual techniques;Passive range of motion;Vasopneumatic Device;Taping    PT Next Visit Plan  ROM/strengthening; gait; edema/pain managment       Patient will benefit from skilled therapeutic intervention in order to improve the following deficits and impairments:  Pain,  Decreased activity tolerance, Decreased range of motion, Decreased strength, Difficulty walking, Increased edema  Visit Diagnosis: Stiffness of right knee, not elsewhere classified  Localized edema  Acute pain of right knee     Problem List Patient Active Problem List   Diagnosis Date Noted  . Primary osteoarthritis of right knee 01/20/2019    Scot Jun, PTA 02/22/2019, 11:52 AM  [place], [place], [address] Phone: [phone]   Fax:  [phone]  Name: Eileen Kangas MRN: [id number] Date of Birth: Sep 26, 1946

## 2019-02-24 ENCOUNTER — Other Ambulatory Visit: Payer: Self-pay

## 2019-02-24 ENCOUNTER — Ambulatory Visit: Payer: Medicare Other | Admitting: Physical Therapy

## 2019-02-24 ENCOUNTER — Encounter: Payer: Self-pay | Admitting: Physical Therapy

## 2019-02-24 DIAGNOSIS — M25561 Pain in right knee: Secondary | ICD-10-CM

## 2019-02-24 DIAGNOSIS — M25661 Stiffness of right knee, not elsewhere classified: Secondary | ICD-10-CM

## 2019-02-24 DIAGNOSIS — R600 Localized edema: Secondary | ICD-10-CM

## 2019-02-24 NOTE — Therapy (Signed)
[place], [place], [address] Phone: [phone]   Fax:  [phone]  Physical Therapy Treatment  Patient Details  Name: Robin Hutchinson MRN: [id number] Date of Birth: 14-Jan-1946 Referring Provider (PT): Dorna Leitz MD   Encounter Date: 02/24/2019  PT End of Session - 02/24/19 0929    Visit Number  7    Date for PT Re-Evaluation  04/02/19    PT Start Time  0845    PT Stop Time  0942    PT Time Calculation (min)  57 min    Activity Tolerance  Patient tolerated treatment well    Behavior During Therapy  [facility] for tasks assessed/performed       Past Medical History:  Diagnosis Date  . Arthritis     Past Surgical History:  Procedure Laterality Date  . CESAREAN SECTION    . TOTAL KNEE ARTHROPLASTY Right 01/20/2019   Procedure: TOTAL KNEE ARTHROPLASTY;  Surgeon: Dorna Leitz, MD;  Location: [facility];  Service: Orthopedics;  Laterality: Right;    There were no vitals filed for this visit.  Subjective Assessment - 02/24/19 0846    Subjective  Pt reports that she has been trying yo exercise at home    Currently in Pain?  No/denies                       [facility] Adult PT Treatment/Exercise - 02/24/19 0001      Knee/Hip Exercises: Aerobic   Recumbent Bike  full revolutions x 4    Nustep  L4 X 6  min       Knee/Hip Exercises: Machines for Strengthening   Cybex Knee Extension  5lb 2x10     Cybex Knee Flexion  20lb 2x10     Cybex Leg Press  20lb 2x10       Knee/Hip Exercises: Standing   Forward Step Up  Right;2 sets;5 sets;Hand Hold: 1;Step Height: 4"      Knee/Hip Exercises: Seated   Sit to Sand  2 sets;10 reps;without UE support   no UE      Vasopneumatic   Number Minutes Vasopneumatic   15 minutes    Vasopnuematic Location   Knee    Vasopneumatic Pressure  Medium    Vasopneumatic Temperature   34      Manual Therapy   Manual Therapy  Passive ROM    Passive ROM  Rt knee flex  and exension               PT Short Term Goals - 02/18/19 1633      PT SHORT TERM GOAL #2   Title  Patient able to ambulate 300 ft safely with SPC.    Time  3    Period  Weeks    Status  Achieved        PT Long Term Goals - 02/22/19 1152      PT LONG TERM GOAL #1   Title  Patient to demo right knee ROM 0-115 deg to normalize ADLS.    Status  Partially Met      PT LONG TERM GOAL #2   Title  Patient to demo 5/5 RLE strength to normalize ADLS    Status  On-going      PT LONG TERM GOAL #3   Title  Patient able to perform ADLs with 1/10 pain or less in right knee.    Status  Partially Met  PT LONG TERM GOAL #4   Title  Patient to ambulate community distances with a normal gait pattern without AD.    Status  On-going            Plan - 02/24/19 0930    Clinical Impression Statement  Good carryover with machine level strengthening. Cues to complete full ROM with seated leg curls and extension. Sit to stand from blue chair with little compensation. HHA x1 with step ups to prevent compensation.     Examination-Activity Limitations  Bend;Sleep    Examination-Participation Restrictions  Cleaning;Meal Prep    Stability/Clinical Decision Making  Stable/Uncomplicated    Rehab Potential  Excellent    PT Frequency  3x / week    PT Duration  8 weeks    PT Treatment/Interventions  ADLs/Self Care Home Management;Cryotherapy;Dentist;Therapeutic exercise;Balance training;Neuromuscular re-education;Patient/family education;Manual techniques;Passive range of motion;Vasopneumatic Device;Taping    PT Next Visit Plan  ROM/strengthening; gait; Functional strengthening       Patient will benefit from skilled therapeutic intervention in order to improve the following deficits and impairments:     Visit Diagnosis: Stiffness of right knee, not elsewhere classified  Localized edema  Acute pain of right knee     Problem  List Patient Active Problem List   Diagnosis Date Noted  . Primary osteoarthritis of right knee 01/20/2019    Scot Jun, PTA 02/24/2019, 9:31 AM  [place], [place], [address] Phone: [phone]   Fax:  [phone]  Name: Shaylynn Nulty MRN: [id number] Date of Birth: May 14, 1946

## 2019-02-26 ENCOUNTER — Ambulatory Visit: Payer: Medicare Other | Admitting: Physical Therapy

## 2019-02-26 ENCOUNTER — Other Ambulatory Visit: Payer: Self-pay

## 2019-02-26 ENCOUNTER — Encounter: Payer: Self-pay | Admitting: Physical Therapy

## 2019-02-26 DIAGNOSIS — M25661 Stiffness of right knee, not elsewhere classified: Secondary | ICD-10-CM

## 2019-02-26 DIAGNOSIS — R600 Localized edema: Secondary | ICD-10-CM

## 2019-02-26 DIAGNOSIS — M25561 Pain in right knee: Secondary | ICD-10-CM

## 2019-02-26 NOTE — Therapy (Signed)
[place], [place], [address] Phone: [phone]   Fax:  [phone]  Physical Therapy Treatment  Patient Details  Name: Robin Hutchinson MRN: [id number] Date of Birth: [birth year] Referring Provider (PT): Dorna Leitz MD   Encounter Date: 02/26/2019  PT End of Session - 02/26/19 0935    Visit Number  8    Date for PT Re-Evaluation  04/02/19    PT Start Time  0845    PT Stop Time  0944    PT Time Calculation (min)  59 min    Activity Tolerance  Patient tolerated treatment well    Behavior During Therapy  [facility] for tasks assessed/performed       Past Medical History:  Diagnosis Date  . Arthritis     Past Surgical History:  Procedure Laterality Date  . CESAREAN SECTION    . TOTAL KNEE ARTHROPLASTY Right 01/20/2019   Procedure: TOTAL KNEE ARTHROPLASTY;  Surgeon: Dorna Leitz, MD;  Location: [facility];  Service: Orthopedics;  Laterality: Right;    There were no vitals filed for this visit.  Subjective Assessment - 02/26/19 0846    Subjective  "Good" stated  a little pain on the sides of her knee    Currently in Pain?  Yes    Pain Score  1     Pain Location  Knee    Pain Orientation  Right;Medial;Lateral                       [facility] Adult PT Treatment/Exercise - 02/26/19 0001      Knee/Hip Exercises: Aerobic   Recumbent Bike  full revolutions x 4 L1    Nustep  L4 X 6  min       Knee/Hip Exercises: Standing   Forward Step Up  Right;Hand Hold: 1;Step Height: 4";1 set;10 reps;Hand Hold: 0    Walking with Sports Cord  30lb $ way x3 eacj      Knee/Hip Exercises: Seated   Sit to Sand  2 sets;10 reps;without UE support   Blue chair holding Red ball      Vasopneumatic   Number Minutes Vasopneumatic   15 minutes    Vasopnuematic Location   Knee    Vasopneumatic Pressure  Medium    Vasopneumatic Temperature   34      Manual Therapy   Manual Therapy  Passive ROM    Passive ROM  Rt  knee flex and exension               PT Short Term Goals - 02/18/19 1633      PT SHORT TERM GOAL #2   Title  Patient able to ambulate 300 ft safely with SPC.    Time  3    Period  Weeks    Status  Achieved        PT Long Term Goals - 02/22/19 1152      PT LONG TERM GOAL #1   Title  Patient to demo right knee ROM 0-115 deg to normalize ADLS.    Status  Partially Met      PT LONG TERM GOAL #2   Title  Patient to demo 5/5 RLE strength to normalize ADLS    Status  On-going      PT LONG TERM GOAL #3   Title  Patient able to perform ADLs with 1/10 pain or less in right knee.    Status  Partially Met      PT LONG TERM GOAL #4   Title  Patient to ambulate community distances with a normal gait pattern without AD.    Status  On-going            Plan - 02/26/19 0935    Clinical Impression Statement  Overall pt did well, some instability with resisted gait  more so with side stepping. Cues needed to push through RLE with 4 inch step up. She was able to tolerated some resistance with sit to stand.     Personal Factors and Comorbidities  Age    Examination-Activity Limitations  Bend;Sleep    Examination-Participation Restrictions  Cleaning;Meal Prep    Stability/Clinical Decision Making  Stable/Uncomplicated    Rehab Potential  Excellent    PT Frequency  2x / week   Spoke to lead PT about decreasing to twice a week.    PT Duration  8 weeks    PT Treatment/Interventions  ADLs/Self Care Home Management;Cryotherapy;Dentist;Therapeutic exercise;Balance training;Neuromuscular re-education;Patient/family education;Manual techniques;Passive range of motion;Vasopneumatic Device;Taping    PT Next Visit Plan  ROM/strengthening; gait; Functional strengthening       Patient will benefit from skilled therapeutic intervention in order to improve the following deficits and impairments:  Pain, Decreased activity tolerance,  Decreased range of motion, Decreased strength, Difficulty walking, Increased edema  Visit Diagnosis: Stiffness of right knee, not elsewhere classified  Localized edema  Acute pain of right knee     Problem List Patient Active Problem List   Diagnosis Date Noted  . Primary osteoarthritis of right knee 01/20/2019    Scot Jun 02/26/2019, 9:41 AM  [place], [place], [address] Phone: [phone]   Fax:  [phone]  Name: Robin Hutchinson MRN: [id number] Date of Birth: 10-23-1946

## 2019-03-01 ENCOUNTER — Other Ambulatory Visit: Payer: Self-pay

## 2019-03-01 ENCOUNTER — Encounter: Payer: Self-pay | Admitting: Physical Therapy

## 2019-03-01 ENCOUNTER — Ambulatory Visit: Payer: Medicare Other | Admitting: Physical Therapy

## 2019-03-01 ENCOUNTER — Encounter: Payer: Medicare Other | Admitting: Physical Therapy

## 2019-03-01 DIAGNOSIS — R262 Difficulty in walking, not elsewhere classified: Secondary | ICD-10-CM

## 2019-03-01 DIAGNOSIS — M25661 Stiffness of right knee, not elsewhere classified: Secondary | ICD-10-CM

## 2019-03-01 DIAGNOSIS — R600 Localized edema: Secondary | ICD-10-CM

## 2019-03-01 DIAGNOSIS — M25561 Pain in right knee: Secondary | ICD-10-CM

## 2019-03-01 NOTE — Therapy (Signed)
[place], [place], [address] Phone: [phone]   Fax:  [phone]  Physical Therapy Treatment  Patient Details  Name: Robin Hutchinson MRN: [id number] Date of Birth: June 24, 1946 Referring Provider (PT): Dorna Leitz MD   Encounter Date: 03/01/2019  PT End of Session - 03/01/19 1229    Visit Number  9    Date for PT Re-Evaluation  04/02/19    PT Start Time  1145    PT Stop Time  1240    PT Time Calculation (min)  55 min    Activity Tolerance  Patient tolerated treatment well    Behavior During Therapy  [facility] for tasks assessed/performed       Past Medical History:  Diagnosis Date  . Arthritis     Past Surgical History:  Procedure Laterality Date  . CESAREAN SECTION    . TOTAL KNEE ARTHROPLASTY Right 01/20/2019   Procedure: TOTAL KNEE ARTHROPLASTY;  Surgeon: Dorna Leitz, MD;  Location: [facility];  Service: Orthopedics;  Laterality: Right;    There were no vitals filed for this visit.  Subjective Assessment - 03/01/19 1147    Subjective  "feeling better, was able to go around a few times on my bike at home"    Currently in Pain?  Yes    Pain Score  1     Pain Location  Knee    Pain Orientation  Right    Aggravating Factors   worse at night                       [facility] Adult PT Treatment/Exercise - 03/01/19 0001      Knee/Hip Exercises: Aerobic   Recumbent Bike  full revolutions x 5 L1    Nustep  L4 X 6  min       Knee/Hip Exercises: Machines for Strengthening   Cybex Knee Extension  5lb 3x10     Cybex Knee Flexion  25lb 2x10     Cybex Leg Press  20lb 2x10 , 1x10 wtithout weight working on deeper bend      Knee/Hip Exercises: Standing   Walking with Sports Cord  resisted gait all directions in hall      Knee/Hip Exercises: Supine   Short Arc Quad Sets  Strengthening;Right;3 sets;10 reps    Short Arc Quad Sets Limitations  2.5#    Other Supine Knee/Hip Exercises  feet on  ball K2C, small bridges      Vasopneumatic   Number Minutes Vasopneumatic   10 minutes    Vasopnuematic Location   Knee    Vasopneumatic Pressure  Medium    Vasopneumatic Temperature   36      Manual Therapy   Manual Therapy  Passive ROM    Passive ROM  Rt knee flex and exension               PT Short Term Goals - 02/18/19 1633      PT SHORT TERM GOAL #2   Title  Patient able to ambulate 300 ft safely with SPC.    Time  3    Period  Weeks    Status  Achieved        PT Long Term Goals - 03/01/19 1230      PT LONG TERM GOAL #5   Title  Patient able to sleep without waking from pain    Status  Partially Met  Plan - 03/01/19 1229    Clinical Impression Statement  Patient with some shortness of breath with the resisted gait and with the bridges.  Her flexion is very good, some limitation in the extension when she is walking.  Cues to get TKE on the leg extension    PT Next Visit Plan  ROM/strengthening; gait; Functional strengthening    Consulted and Agree with Plan of Care  Patient       Patient will benefit from skilled therapeutic intervention in order to improve the following deficits and impairments:  Pain, Decreased activity tolerance, Decreased range of motion, Decreased strength, Difficulty walking, Increased edema  Visit Diagnosis: Stiffness of right knee, not elsewhere classified  Localized edema  Acute pain of right knee  Difficulty in walking, not elsewhere classified     Problem List Patient Active Problem List   Diagnosis Date Noted  . Primary osteoarthritis of right knee 01/20/2019    Sumner Boast., PT 03/01/2019, 12:32 PM  [place], [place], [address] Phone: [phone]   Fax:  [phone]  Name: Yuriko Portales MRN: [id number] Date of Birth: 06-23-1946

## 2019-03-04 ENCOUNTER — Ambulatory Visit: Payer: Medicare Other | Attending: Orthopedic Surgery | Admitting: Physical Therapy

## 2019-03-04 ENCOUNTER — Other Ambulatory Visit: Payer: Self-pay

## 2019-03-04 ENCOUNTER — Encounter: Payer: Self-pay | Admitting: Physical Therapy

## 2019-03-04 DIAGNOSIS — M25661 Stiffness of right knee, not elsewhere classified: Secondary | ICD-10-CM

## 2019-03-04 DIAGNOSIS — R600 Localized edema: Secondary | ICD-10-CM | POA: Diagnosis present

## 2019-03-04 DIAGNOSIS — R262 Difficulty in walking, not elsewhere classified: Secondary | ICD-10-CM | POA: Diagnosis present

## 2019-03-04 DIAGNOSIS — M25561 Pain in right knee: Secondary | ICD-10-CM | POA: Diagnosis present

## 2019-03-04 NOTE — Therapy (Signed)
[address] [place], [place], [address] Phone: [phone]   Fax:  [phone] Progress Note Reporting Period 02/05/19 to 03/04/19 for the first 10 visits   See note below for Objective Data and Assessment of Progress/Goals.      Physical Therapy Treatment  Patient Details  Name: Robin Hutchinson MRN: [id number] Date of Birth: 1946/02/10 Referring Provider (PT): Dorna Leitz MD   Encounter Date: 03/04/2019  PT End of Session - 03/04/19 0928    Visit Number  10    Date for PT Re-Evaluation  04/02/19    PT Start Time  0841    PT Stop Time  0944    PT Time Calculation (min)  63 min    Activity Tolerance  Patient tolerated treatment well    Behavior During Therapy  [facility] for tasks assessed/performed       Past Medical History:  Diagnosis Date  . Arthritis     Past Surgical History:  Procedure Laterality Date  . CESAREAN SECTION    . TOTAL KNEE ARTHROPLASTY Right 01/20/2019   Procedure: TOTAL KNEE ARTHROPLASTY;  Surgeon: Dorna Leitz, MD;  Location: [facility];  Service: Orthopedics;  Laterality: Right;    There were no vitals filed for this visit.  Subjective Assessment - 03/04/19 0841    Subjective  Most pain at night    Currently in Pain?  No/denies                       [facility] Adult PT Treatment/Exercise - 03/04/19 0001      Ambulation/Gait   Stairs  Yes    Stairs Assistance  5: Supervision    Stair Management Technique  One rail Right;Alternating pattern;Step to pattern    Number of Stairs  12    Height of Stairs  6    Gait Comments  R knee weakness noted throughout. Pain with eccentric loads.       Knee/Hip Exercises: Aerobic   Elliptical  I5 R 3 x24mn     Recumbent Bike  full revolutions x 3     Nustep  L4 X 5  min       Knee/Hip Exercises: Machines for Strengthening   Cybex Knee Extension  5lb 2x10     Cybex Knee Flexion  20lb 2x10, RLE 10lb 2x10     Cybex Leg Press  20lb 2x10  , 1x10 wtithout weight working on deeper bend      Knee/Hip Exercises: Seated   Sit to Sand  2 sets;10 reps;without UE support   Holding yellow ball from blue chair      Vasopneumatic   Number Minutes Vasopneumatic   15 minutes    Vasopnuematic Location   Knee    Vasopneumatic Pressure  Medium    Vasopneumatic Temperature   36      Manual Therapy   Manual Therapy  Passive ROM    Passive ROM  Rt knee flex and exension               PT Short Term Goals - 02/18/19 1633      PT SHORT TERM GOAL #2   Title  Patient able to ambulate 300 ft safely with SPC.    Time  3    Period  Weeks    Status  Achieved        PT Long Term Goals - 03/01/19 1230      PT LONG  TERM GOAL #5   Title  Patient able to sleep without waking from pain    Status  Partially Met            Plan - 03/04/19 0932    Clinical Impression Statement  Pt hip flexion remains good, but she does have some functional weakness. Eccentric load weakness noted when descending stairs. Pt does compensate ascending stairs hopping off LLE.  Assist needed with SL on leg press. Cues to get full muscle contraction with seated leg extensions.     Examination-Activity Limitations  Bend;Sleep    Examination-Participation Restrictions  Cleaning;Meal Prep    Stability/Clinical Decision Making  Stable/Uncomplicated    Rehab Potential  Excellent    PT Frequency  2x / week    PT Duration  8 weeks    PT Treatment/Interventions  ADLs/Self Care Home Management;Cryotherapy;Dentist;Therapeutic exercise;Balance training;Neuromuscular re-education;Patient/family education;Manual techniques;Passive range of motion;Vasopneumatic Device;Taping    PT Next Visit Plan  ROM/strengthening; gait; Functional strengthening       Patient will benefit from skilled therapeutic intervention in order to improve the following deficits and impairments:  Pain, Decreased activity tolerance,  Decreased range of motion, Decreased strength, Difficulty walking, Increased edema  Visit Diagnosis: Stiffness of right knee, not elsewhere classified  Localized edema  Acute pain of right knee  Difficulty in walking, not elsewhere classified     Problem List Patient Active Problem List   Diagnosis Date Noted  . Primary osteoarthritis of right knee 01/20/2019    Scot Jun, PTA 03/04/2019, 9:35 AM  [place], [place], [address] Phone: [phone]   Fax:  [phone]  Name: Robin Hutchinson MRN: [id number] Date of Birth: February 10, 1946

## 2019-03-04 NOTE — Patient Instructions (Signed)
Access Code: [email or web address]  URL: [email or web address]  Date: 03/04/2019  Prepared by: Cheri Fowler   Exercises  Sit to Stand - 10 reps - 3 sets - 1x daily - 7x weekly  Seated Long Arc Quad - 10 reps - 3 sets - 1x daily - 7x weekly  Supine Bridge - 10 reps - 3 sets - 1x daily - 7x weekly  Seated Hamstring Curl with Anchored Resistance - 10 reps - 3 sets - 1x daily - 7x weekly

## 2019-03-09 ENCOUNTER — Ambulatory Visit: Payer: Medicare Other | Admitting: Physical Therapy

## 2019-03-09 ENCOUNTER — Other Ambulatory Visit: Payer: Self-pay

## 2019-03-09 ENCOUNTER — Encounter: Payer: Self-pay | Admitting: Physical Therapy

## 2019-03-09 DIAGNOSIS — R262 Difficulty in walking, not elsewhere classified: Secondary | ICD-10-CM

## 2019-03-09 DIAGNOSIS — M25661 Stiffness of right knee, not elsewhere classified: Secondary | ICD-10-CM

## 2019-03-09 DIAGNOSIS — R600 Localized edema: Secondary | ICD-10-CM

## 2019-03-09 DIAGNOSIS — M25561 Pain in right knee: Secondary | ICD-10-CM

## 2019-03-09 NOTE — Therapy (Signed)
[place], [place], [address] Phone: [phone]   Fax:  [phone]  Physical Therapy Treatment  Patient Details  Name: Robin Hutchinson MRN: [id number] Date of Birth: 1946/04/26 Referring Provider (PT): Dorna Leitz MD   Encounter Date: 03/09/2019  PT End of Session - 03/09/19 0931    Visit Number  11    Date for PT Re-Evaluation  04/02/19    PT Start Time  0845    PT Stop Time  0946    PT Time Calculation (min)  61 min    Activity Tolerance  Patient tolerated treatment well    Behavior During Therapy  [facility] for tasks assessed/performed       Past Medical History:  Diagnosis Date  . Arthritis     Past Surgical History:  Procedure Laterality Date  . CESAREAN SECTION    . TOTAL KNEE ARTHROPLASTY Right 01/20/2019   Procedure: TOTAL KNEE ARTHROPLASTY;  Surgeon: Dorna Leitz, MD;  Location: [facility];  Service: Orthopedics;  Laterality: Right;    There were no vitals filed for this visit.  Subjective Assessment - 03/09/19 0849    Subjective  Pt reports exercising three times a day.     Currently in Pain?  No/denies    Pain Score  0-No pain                       [facility] Adult PT Treatment/Exercise - 03/09/19 0001      Knee/Hip Exercises: Aerobic   Elliptical  I7 R5 x4 min     Recumbent Bike  full revolutions x 3       Knee/Hip Exercises: Machines for Strengthening   Cybex Knee Extension  5lb 2x10     Cybex Knee Flexion  25lb 2x10     Cybex Leg Press  30lb 2x15, TKE RLE 2x10      Knee/Hip Exercises: Standing   Step Down  Right;2 sets;10 reps;Hand Hold: 1;Step Height: 4"    Walking with Sports Cord  30lb 4vway x3 eacj      Knee/Hip Exercises: Seated   Long Arc Quad  Strengthening;Right;2 sets;Weights;10 reps    Long Arc Quad Weight  3 lbs.      Vasopneumatic   Number Minutes Vasopneumatic   15 minutes    Vasopnuematic Location   Knee    Vasopneumatic Pressure  Medium    Vasopneumatic Temperature   36      Manual Therapy   Manual Therapy  Passive ROM    Passive ROM  R knee ext               PT Short Term Goals - 02/18/19 1633      PT SHORT TERM GOAL #2   Title  Patient able to ambulate 300 ft safely with SPC.    Time  3    Period  Weeks    Status  Achieved        PT Long Term Goals - 03/01/19 1230      PT LONG TERM GOAL #5   Title  Patient able to sleep without waking from pain    Status  Partially Met            Plan - 03/09/19 0932    Clinical Impression Statement  Pt continues to have R quad weakness noted with seated extensions and step downs. Cues throughout session to give good effort,  she tends to not  push hard against resistance although she is strong enough to move the weight. Cues to taken bigger step walking forward against resistance. Good stability with resisted side steps.     PT Treatment/Interventions  ADLs/Self Care Home Management;Cryotherapy;Dentist;Therapeutic exercise;Balance training;Neuromuscular re-education;Patient/family education;Manual techniques;Passive range of motion;Vasopneumatic Device;Taping    PT Next Visit Plan  ROM/strengthening; gait; Functional strengthening       Patient will benefit from skilled therapeutic intervention in order to improve the following deficits and impairments:  Pain, Decreased activity tolerance, Decreased range of motion, Decreased strength, Difficulty walking, Increased edema  Visit Diagnosis: Stiffness of right knee, not elsewhere classified  Localized edema  Acute pain of right knee  Difficulty in walking, not elsewhere classified     Problem List Patient Active Problem List   Diagnosis Date Noted  . Primary osteoarthritis of right knee 01/20/2019    Scot Jun, PTA 03/09/2019, 9:37 AM  [place],  [place], [address] Phone: [phone]   Fax:  [phone]  Name: La Shehan MRN: [id number] Date of Birth: 17-Jun-1946

## 2019-03-12 ENCOUNTER — Ambulatory Visit: Payer: Medicare Other | Admitting: Physical Therapy

## 2019-03-12 ENCOUNTER — Encounter: Payer: Self-pay | Admitting: Physical Therapy

## 2019-03-12 ENCOUNTER — Other Ambulatory Visit: Payer: Self-pay

## 2019-03-12 DIAGNOSIS — R600 Localized edema: Secondary | ICD-10-CM

## 2019-03-12 DIAGNOSIS — M25561 Pain in right knee: Secondary | ICD-10-CM

## 2019-03-12 DIAGNOSIS — R262 Difficulty in walking, not elsewhere classified: Secondary | ICD-10-CM

## 2019-03-12 DIAGNOSIS — M25661 Stiffness of right knee, not elsewhere classified: Secondary | ICD-10-CM | POA: Diagnosis not present

## 2019-03-12 NOTE — Therapy (Signed)
[place], [place], [address] Phone: [phone]   Fax:  [phone]  Physical Therapy Treatment  Patient Details  Name: Robin Hutchinson MRN: [id number] Date of Birth: 1946-03-23 Referring Provider (PT): Dorna Leitz MD   Encounter Date: 03/12/2019  PT End of Session - 03/12/19 0938    Visit Number  12    Date for PT Re-Evaluation  04/02/19    PT Start Time  0843    PT Stop Time  0946    PT Time Calculation (min)  63 min    Activity Tolerance  Patient tolerated treatment well    Behavior During Therapy  [facility] for tasks assessed/performed       Past Medical History:  Diagnosis Date  . Arthritis     Past Surgical History:  Procedure Laterality Date  . CESAREAN SECTION    . TOTAL KNEE ARTHROPLASTY Right 01/20/2019   Procedure: TOTAL KNEE ARTHROPLASTY;  Surgeon: Dorna Leitz, MD;  [facility]: [facility];  Service: Orthopedics;  Laterality: Right;    There were no vitals filed for this visit.  Subjective Assessment - 03/12/19 0842    Subjective  "I tried hard with the stretch"     Currently in Pain?  Yes    Pain Score  1     Pain Location  Knee    Pain Orientation  Right                       [facility] Adult PT Treatment/Exercise - 03/12/19 0001      Knee/Hip Exercises: Stretches   Passive Hamstring Stretch  Right;10 seconds;3 reps      Knee/Hip Exercises: Aerobic   Elliptical  I7 R5 x5 min     Recumbent Bike  full revolutions x 5      Knee/Hip Exercises: Machines for Strengthening   Cybex Leg Press  40lb 2x10, TKE RLE 2x10      Knee/Hip Exercises: Standing   Lateral Step Up  Right;1 set;10 reps;Hand Hold: 0;Step Height: 4"    Forward Step Up  Right;1 set;10 reps;Hand Hold: 0;Step Height: 4"    Walking with Sports Cord  30lb 4vway x3 each      Knee/Hip Exercises: Seated   Sit to Sand  2 sets;10 reps;without UE support      Knee/Hip Exercises: Supine   Straight Leg Raises   Right;Strengthening;15 reps;1 set      Vasopneumatic   Number Minutes Vasopneumatic   15 minutes    Vasopnuematic Location   Knee    Vasopneumatic Pressure  Medium    Vasopneumatic Temperature   36      Manual Therapy   Manual Therapy  Passive ROM    Passive ROM  R knee ext               PT Short Term Goals - 02/18/19 1633      PT SHORT TERM GOAL #2   Title  Patient able to ambulate 300 ft safely with SPC.    Time  3    Period  Weeks    Status  Achieved        PT Long Term Goals - 03/12/19 0093      PT LONG TERM GOAL #1   Title  Patient to demo right knee ROM 0-115 deg to normalize ADLS.    Status  Partially Met      PT LONG TERM GOAL #2  Title  Patient to demo 5/5 RLE strength to normalize ADLS    Status  On-going      PT LONG TERM GOAL #3   Title  Patient able to perform ADLs with 1/10 pain or less in right knee.    Status  Partially Met            Plan - 03/12/19 0939    Clinical Impression Statement  Overall ROM is good, she has been working on extension at home and it is showing. She has less pain with MT with passive extension. R quad remains very weak, She does compensate with the 4 inch box on step ups. Attempted some SL extension on machine but pt to weak to extend R leg.    Personal Factors and Comorbidities  Age    Examination-Activity Limitations  Bend;Sleep    Examination-Participation Restrictions  Cleaning;Meal Prep    Stability/Clinical Decision Making  Stable/Uncomplicated    Rehab Potential  Excellent    PT Frequency  2x / week    PT Duration  8 weeks    PT Treatment/Interventions  ADLs/Self Care Home Management;Cryotherapy;Dentist;Therapeutic exercise;Balance training;Neuromuscular re-education;Patient/family education;Manual techniques;Passive range of motion;Vasopneumatic Device;Taping    PT Next Visit Plan  ROM/strengthening; gait; Functional strengthening       Patient  will benefit from skilled therapeutic intervention in order to improve the following deficits and impairments:  Pain, Decreased activity tolerance, Decreased range of motion, Decreased strength, Difficulty walking, Increased edema  Visit Diagnosis: Stiffness of right knee, not elsewhere classified  Acute pain of right knee  Localized edema  Difficulty in walking, not elsewhere classified     Problem List Patient Active Problem List   Diagnosis Date Noted  . Primary osteoarthritis of right knee 01/20/2019    Scot Jun, PTA 03/12/2019, 9:44 AM  [place], [place], [address] Phone: [phone]   Fax:  [phone]  Name: Robin Hutchinson MRN: [id number] Date of Birth: [birth year]

## 2019-03-12 NOTE — Patient Instructions (Signed)
Access Code: [email or web address]  URL: [email or web address]  Date: 03/12/2019  Prepared by: Cheri Fowler   Exercises  Supine Active Straight Leg Raise - 10 reps - 3 sets - 3 hold - 1x daily - 7x weekly

## 2019-03-16 ENCOUNTER — Ambulatory Visit: Payer: Medicare Other | Admitting: Physical Therapy

## 2019-03-16 ENCOUNTER — Encounter: Payer: Self-pay | Admitting: Physical Therapy

## 2019-03-16 ENCOUNTER — Other Ambulatory Visit: Payer: Self-pay

## 2019-03-16 DIAGNOSIS — R600 Localized edema: Secondary | ICD-10-CM

## 2019-03-16 DIAGNOSIS — M25661 Stiffness of right knee, not elsewhere classified: Secondary | ICD-10-CM

## 2019-03-16 DIAGNOSIS — R262 Difficulty in walking, not elsewhere classified: Secondary | ICD-10-CM

## 2019-03-16 DIAGNOSIS — M25561 Pain in right knee: Secondary | ICD-10-CM

## 2019-03-16 NOTE — Therapy (Signed)
[place], [place], [address] Phone: [phone]   Fax:  [phone]  Physical Therapy Treatment  Patient Details  Name: Allisson Schindel MRN: [id number] Date of Birth: [birth year] Referring Provider (PT): Dorna Leitz MD   Encounter Date: 03/16/2019  PT End of Session - 03/16/19 0925    Visit Number  13    Date for PT Re-Evaluation  04/02/19    PT Start Time  0843    PT Stop Time  0941    PT Time Calculation (min)  58 min    Activity Tolerance  Patient tolerated treatment well    Behavior During Therapy  [facility] for tasks assessed/performed       Past Medical History:  Diagnosis Date  . Arthritis     Past Surgical History:  Procedure Laterality Date  . CESAREAN SECTION    . TOTAL KNEE ARTHROPLASTY Right 01/20/2019   Procedure: TOTAL KNEE ARTHROPLASTY;  Surgeon: Dorna Leitz, MD;  Location: [facility];  Service: Orthopedics;  Laterality: Right;    There were no vitals filed for this visit.  Subjective Assessment - 03/16/19 0843    Subjective  "So far so good, I try not to use the cane"    Currently in Pain?  No/denies         [facility] PT Assessment - 03/16/19 0001      AROM   AROM Assessment Site  Knee    Right Knee Extension  5    Right Knee Flexion  110      Strength   Right Knee Flexion  4/5    Right Knee Extension  4+/5                   [facility] Adult PT Treatment/Exercise - 03/16/19 0001      Knee/Hip Exercises: Aerobic   Elliptical  I8 R6 x5 min     Recumbent Bike  full revolutions x 5      Knee/Hip Exercises: Machines for Strengthening   Cybex Knee Extension  5lb RLE 3x5     Cybex Knee Flexion  RLE 15lb 2x10     Cybex Leg Press  40lb 2x10, TKE RLE 2x10      Knee/Hip Exercises: Standing   Heel Raises  Both;2 sets;15 reps;2 seconds    Lateral Step Up  Right;1 set;10 reps;Hand Hold: 0;Step Height: 4"    Forward Step Up  Right;1 set;10 reps;Hand Hold: 0;Step Height: 4"       Knee/Hip Exercises: Seated   Sit to Sand  2 sets;10 reps;without UE support               PT Short Term Goals - 02/18/19 1633      PT SHORT TERM GOAL #2   Title  Patient able to ambulate 300 ft safely with SPC.    Time  3    Period  Weeks    Status  Achieved        PT Long Term Goals - 03/12/19 0240      PT LONG TERM GOAL #1   Title  Patient to demo right knee ROM 0-115 deg to normalize ADLS.    Status  Partially Met      PT LONG TERM GOAL #2   Title  Patient to demo 5/5 RLE strength to normalize ADLS    Status  On-going      PT LONG TERM GOAL #3   Title  Patient  able to perform ADLs with 1/10 pain or less in right knee.    Status  Partially Met            Plan - 03/16/19 0926    Clinical Impression Statement  Pt has progressed increasing her R knee ROM and strength . Despite ROM gains she is still lack ing full knee extension. She was able to complete leg extension on machine with R leg only for the first time today. No reports of pain. R knee remains functionally weak. She continues to compensate with step up and have eccentric load pain and weakness descending stairs.     Examination-Activity Limitations  Bend;Sleep    Examination-Participation Restrictions  Cleaning;Meal Prep    Rehab Potential  Excellent    PT Frequency  2x / week    PT Duration  8 weeks    PT Treatment/Interventions  ADLs/Self Care Home Management;Cryotherapy;Dentist;Therapeutic exercise;Balance training;Neuromuscular re-education;Patient/family education;Manual techniques;Passive range of motion;Vasopneumatic Device;Taping    PT Next Visit Plan  ROM/strengthening; gait; Functional strengthening       Patient will benefit from skilled therapeutic intervention in order to improve the following deficits and impairments:  Pain, Decreased activity tolerance, Decreased range of motion, Decreased strength, Difficulty walking,  Increased edema  Visit Diagnosis: Stiffness of right knee, not elsewhere classified  Acute pain of right knee  Localized edema  Difficulty in walking, not elsewhere classified     Problem List Patient Active Problem List   Diagnosis Date Noted  . Primary osteoarthritis of right knee 01/20/2019    Scot Jun, PTA 03/16/2019, 9:29 AM  [place], [place], [address] Phone: [phone]   Fax:  [phone]  Name: Clela Hagadorn MRN: [id number] Date of Birth: Nov 02, 1946

## 2019-03-19 ENCOUNTER — Encounter: Payer: Self-pay | Admitting: Physical Therapy

## 2019-03-19 ENCOUNTER — Ambulatory Visit: Payer: Medicare Other | Admitting: Physical Therapy

## 2019-03-19 ENCOUNTER — Other Ambulatory Visit: Payer: Self-pay

## 2019-03-19 DIAGNOSIS — R600 Localized edema: Secondary | ICD-10-CM

## 2019-03-19 DIAGNOSIS — M25561 Pain in right knee: Secondary | ICD-10-CM

## 2019-03-19 DIAGNOSIS — M25661 Stiffness of right knee, not elsewhere classified: Secondary | ICD-10-CM

## 2019-03-19 DIAGNOSIS — R262 Difficulty in walking, not elsewhere classified: Secondary | ICD-10-CM

## 2019-03-19 NOTE — Therapy (Signed)
[place], [place], [address] Phone: [phone]   Fax:  [phone]  Physical Therapy Treatment  Patient Details  Name: Robin Hutchinson MRN: [id number] Date of Birth: 1946-10-25 Referring Provider (PT): Dorna Leitz MD   Encounter Date: 03/19/2019  PT End of Session - 03/19/19 0928    Visit Number  14    Date for PT Re-Evaluation  04/02/19    PT Start Time  0843    PT Stop Time  0943    PT Time Calculation (min)  60 min    Activity Tolerance  Patient tolerated treatment well    Behavior During Therapy  [facility] for tasks assessed/performed       Past Medical History:  Diagnosis Date  . Arthritis     Past Surgical History:  Procedure Laterality Date  . CESAREAN SECTION    . TOTAL KNEE ARTHROPLASTY Right 01/20/2019   Procedure: TOTAL KNEE ARTHROPLASTY;  Surgeon: Dorna Leitz, MD;  Location: [facility];  Service: Orthopedics;  Laterality: Right;    There were no vitals filed for this visit.  Subjective Assessment - 03/19/19 0845    Subjective  "So far so good" "It only bothers me at night"    Currently in Pain?  No/denies                       [facility] Adult PT Treatment/Exercise - 03/19/19 0001      Ambulation/Gait   Stairs  Yes    Stairs Assistance  6: Modified independent (Device/Increase time);5: Supervision    Stair Management Technique  No rails;One rail Right;Alternating pattern    Number of Stairs  24    Height of Stairs  6    Gait Comments  1 flight, pt some R knee pain descending stairs,       Knee/Hip Exercises: Aerobic   Elliptical  I8 R6 x5 min     Recumbent Bike  full revolutions x 5      Knee/Hip Exercises: Machines for Strengthening   Cybex Knee Extension  10lb 2x10, RLE 5lb x10    Cybex Knee Flexion  25lb 2x10, RLE 15lb x10     Cybex Leg Press  50lb 2x10, TKE RLE 2x10      Vasopneumatic   Number Minutes Vasopneumatic   15 minutes    Vasopnuematic Location    Knee    Vasopneumatic Pressure  Medium    Vasopneumatic Temperature   36      Manual Therapy   Manual Therapy  Passive ROM;Joint mobilization    Joint Mobilization   R knee to aid with ext    Passive ROM  R knee ext               PT Short Term Goals - 02/18/19 1633      PT SHORT TERM GOAL #2   Title  Patient able to ambulate 300 ft safely with SPC.    Time  3    Period  Weeks    Status  Achieved        PT Long Term Goals - 03/19/19 0930      PT LONG TERM GOAL #1   Title  Patient to demo right knee ROM 0-115 deg to normalize ADLS.    Status  Partially Met      PT LONG TERM GOAL #3   Title  Patient able to perform ADLs with 1/10 pain or less  in right knee.    Status  Partially Met      PT LONG TERM GOAL #4   Title  Patient to ambulate community distances with a normal gait pattern without AD.    Status  Partially Met      PT LONG TERM GOAL #5   Title  Patient able to sleep without waking from pain    Status  On-going            Plan - 03/19/19 0928    Clinical Impression Statement  Pt is progressing well, progressed to stair negotiation with less pain overall, she was able to descent with little compensation compared to last stair attempt. Increase wright tolerated on all machines. Decrease pain with MT with extension stretch.     Stability/Clinical Decision Making  Stable/Uncomplicated    Rehab Potential  Excellent    PT Frequency  2x / week    PT Duration  8 weeks    PT Treatment/Interventions  ADLs/Self Care Home Management;Cryotherapy;Dentist;Therapeutic exercise;Balance training;Neuromuscular re-education;Patient/family education;Manual techniques;Passive range of motion;Vasopneumatic Device;Taping    PT Next Visit Plan  ROM/strengthening; gait; Functional strengthening       Patient will benefit from skilled therapeutic intervention in order to improve the following deficits and impairments:   Pain, Decreased activity tolerance, Decreased range of motion, Decreased strength, Difficulty walking, Increased edema  Visit Diagnosis: Acute pain of right knee  Stiffness of right knee, not elsewhere classified  Localized edema  Difficulty in walking, not elsewhere classified     Problem List Patient Active Problem List   Diagnosis Date Noted  . Primary osteoarthritis of right knee 01/20/2019    Scot Jun, PTA 03/19/2019, 9:30 AM  [place], [place], [address] Phone: [phone]   Fax:  [phone]  Name: Sherina Stammer MRN: [id number] Date of Birth: [birth year]

## 2019-03-23 ENCOUNTER — Other Ambulatory Visit: Payer: Self-pay

## 2019-03-23 ENCOUNTER — Ambulatory Visit: Payer: Medicare Other | Admitting: Physical Therapy

## 2019-03-23 ENCOUNTER — Encounter: Payer: Self-pay | Admitting: Physical Therapy

## 2019-03-23 DIAGNOSIS — R262 Difficulty in walking, not elsewhere classified: Secondary | ICD-10-CM

## 2019-03-23 DIAGNOSIS — R600 Localized edema: Secondary | ICD-10-CM

## 2019-03-23 DIAGNOSIS — M25661 Stiffness of right knee, not elsewhere classified: Secondary | ICD-10-CM

## 2019-03-23 DIAGNOSIS — M25561 Pain in right knee: Secondary | ICD-10-CM

## 2019-03-23 NOTE — Therapy (Signed)
[place], [place], [address] Phone: [phone]   Fax:  [phone]  Physical Therapy Treatment  Patient Details  Name: Robin Hutchinson MRN: [id number] Date of Birth: 12/04/1945 Referring Provider (PT): Dorna Leitz MD   Encounter Date: 03/23/2019  PT End of Session - 03/23/19 0928    Visit Number  15    Date for PT Re-Evaluation  04/02/19    PT Start Time  0844    PT Stop Time  0943    PT Time Calculation (min)  59 min    Activity Tolerance  Patient tolerated treatment well    Behavior During Therapy  [facility] for tasks assessed/performed       Past Medical History:  Diagnosis Date  . Arthritis     Past Surgical History:  Procedure Laterality Date  . CESAREAN SECTION    . TOTAL KNEE ARTHROPLASTY Right 01/20/2019   Procedure: TOTAL KNEE ARTHROPLASTY;  Surgeon: Dorna Leitz, MD;  Location: [facility];  Service: Orthopedics;  Laterality: Right;    There were no vitals filed for this visit.  Subjective Assessment - 03/23/19 0844    Subjective  "So far I feel ok"    Currently in Pain?  No/denies         [facility] PT Assessment - 03/23/19 0001      AROM   Right Knee Extension  4    Right Knee Flexion  106      Ambulation/Gait   Stairs  Yes    Stairs Assistance  6: Modified independent (Device/Increase time);5: Supervision    Stair Management Technique  No rails;One rail Right;Alternating pattern    Number of Stairs  24    Height of Stairs  6    Gait Comments  1 flight, pt some R knee pain descending stairs,                    [facility] Adult PT Treatment/Exercise - 03/23/19 0001      Knee/Hip Exercises: Aerobic   Elliptical  I8 R6 x4 min     Recumbent Bike  full revolutions x 5      Knee/Hip Exercises: Machines for Strengthening   Cybex Knee Extension  10lb 2x10    Cybex Knee Flexion  25lb 2x10    Cybex Leg Press  30lb 2x15      Knee/Hip Exercises: Standing   Walking with Sports  Cord  40lb 4vway x3 each      Vasopneumatic   Number Minutes Vasopneumatic   15 minutes    Vasopnuematic Location   Knee    Vasopneumatic Pressure  Medium    Vasopneumatic Temperature   36      Manual Therapy   Manual Therapy  Passive ROM;Joint mobilization    Joint Mobilization   R knee to aid with ext    Passive ROM  R knee ext               PT Short Term Goals - 02/18/19 1633      PT SHORT TERM GOAL #2   Title  Patient able to ambulate 300 ft safely with SPC.    Time  3    Period  Weeks    Status  Achieved        PT Long Term Goals - 03/19/19 0930      PT LONG TERM GOAL #1   Title  Patient to demo right knee ROM 0-115  deg to normalize ADLS.    Status  Partially Met      PT LONG TERM GOAL #3   Title  Patient able to perform ADLs with 1/10 pain or less in right knee.    Status  Partially Met      PT LONG TERM GOAL #4   Title  Patient to ambulate community distances with a normal gait pattern without AD.    Status  Partially Met      PT LONG TERM GOAL #5   Title  Patient able to sleep without waking from pain    Status  On-going            Plan - 03/23/19 2508    Clinical Impression Statement  Pt with a small progression with R knee ROM. She remains weak with TKE functionally. Extension is slightly better with closed chain interventions. Good carryover with stairs, but still has some pain when descending.    Examination-Activity Limitations  Bend;Sleep    Examination-Participation Restrictions  Cleaning;Meal Prep    Stability/Clinical Decision Making  Stable/Uncomplicated    Rehab Potential  Excellent    PT Frequency  2x / week    PT Duration  8 weeks    PT Treatment/Interventions  ADLs/Self Care Home Management;Cryotherapy;Dentist;Therapeutic exercise;Balance training;Neuromuscular re-education;Patient/family education;Manual techniques;Passive range of motion;Vasopneumatic Device;Taping    PT  Next Visit Plan  ROM/strengthening; gait; Functional strengthening       Patient will benefit from skilled therapeutic intervention in order to improve the following deficits and impairments:  Pain, Decreased activity tolerance, Decreased range of motion, Decreased strength, Difficulty walking, Increased edema  Visit Diagnosis: Acute pain of right knee  Stiffness of right knee, not elsewhere classified  Localized edema  Difficulty in walking, not elsewhere classified     Problem List Patient Active Problem List   Diagnosis Date Noted  . Primary osteoarthritis of right knee 01/20/2019    Scot Jun, PTA 03/23/2019, 9:30 AM  [place], [place], [address] Phone: [phone]   Fax:  [phone]  Name: Robin Hutchinson MRN: [id number] Date of Birth: 11/29/1946

## 2019-03-26 ENCOUNTER — Ambulatory Visit: Payer: Medicare Other | Admitting: Physical Therapy

## 2019-03-26 ENCOUNTER — Encounter: Payer: Self-pay | Admitting: Physical Therapy

## 2019-03-26 ENCOUNTER — Other Ambulatory Visit: Payer: Self-pay

## 2019-03-26 DIAGNOSIS — M25661 Stiffness of right knee, not elsewhere classified: Secondary | ICD-10-CM | POA: Diagnosis not present

## 2019-03-26 DIAGNOSIS — R600 Localized edema: Secondary | ICD-10-CM

## 2019-03-26 DIAGNOSIS — R262 Difficulty in walking, not elsewhere classified: Secondary | ICD-10-CM

## 2019-03-26 DIAGNOSIS — M25561 Pain in right knee: Secondary | ICD-10-CM

## 2019-03-26 NOTE — Therapy (Signed)
[place], [place], [address] Phone: [phone]   Fax:  [phone]  Physical Therapy Treatment  Patient Details  Name: Robin Hutchinson MRN: [id number] Date of Birth: Jan 13, 1946 Referring Provider (PT): Dorna Leitz MD   Encounter Date: 03/26/2019  PT End of Session - 03/26/19 0939    Visit Number  16    Date for PT Re-Evaluation  04/02/19    PT Start Time  0845    PT Stop Time  0950    PT Time Calculation (min)  65 min    Activity Tolerance  Patient tolerated treatment well    Behavior During Therapy  [facility] for tasks assessed/performed       Past Medical History:  Diagnosis Date  . Arthritis     Past Surgical History:  Procedure Laterality Date  . CESAREAN SECTION    . TOTAL KNEE ARTHROPLASTY Right 01/20/2019   Procedure: TOTAL KNEE ARTHROPLASTY;  Surgeon: Dorna Leitz, MD;  Location: [facility];  Service: Orthopedics;  Laterality: Right;    There were no vitals filed for this visit.  Subjective Assessment - 03/26/19 0845    Subjective  "So far good"    Currently in Pain?  Yes    Pain Score  1     Pain Location  Knee    Pain Orientation  Right                       [facility] Adult PT Treatment/Exercise - 03/26/19 0001      Knee/Hip Exercises: Aerobic   Elliptical  I10 R5 x 6 min     Recumbent Bike  full revolutions x 4      Knee/Hip Exercises: Machines for Strengthening   Cybex Knee Extension  RLE 5lb 2x10     Cybex Knee Flexion  RL 15lb 2x10     Cybex Leg Press  30lb 2x15      Knee/Hip Exercises: Standing   Lateral Step Up  Right;1 set;10 reps;Hand Hold: 0;Step Height: 6"    Forward Step Up  Right;1 set;10 reps;Hand Hold: 0;Step Height: 6"    Walking with Sports Cord  40lb 4 way x3 each      Knee/Hip Exercises: Seated   Sit to Sand  2 sets;10 reps;without UE support   holding yellow ball      Vasopneumatic   Number Minutes Vasopneumatic   15 minutes    Vasopnuematic  Location   Knee    Vasopneumatic Pressure  Medium    Vasopneumatic Temperature   36      Manual Therapy   Manual Therapy  Passive ROM;Joint mobilization    Joint Mobilization   R knee to aid with ext    Passive ROM  R knee ext               PT Short Term Goals - 02/18/19 1633      PT SHORT TERM GOAL #2   Title  Patient able to ambulate 300 ft safely with SPC.    Time  3    Period  Weeks    Status  Achieved        PT Long Term Goals - 03/19/19 0930      PT LONG TERM GOAL #1   Title  Patient to demo right knee ROM 0-115 deg to normalize ADLS.    Status  Partially Met      PT LONG TERM GOAL #  3   Title  Patient able to perform ADLs with 1/10 pain or less in right knee.    Status  Partially Met      PT LONG TERM GOAL #4   Title  Patient to ambulate community distances with a normal gait pattern without AD.    Status  Partially Met      PT LONG TERM GOAL #5   Title  Patient able to sleep without waking from pain    Status  On-going            Plan - 03/26/19 0940    Clinical Impression Statement  Pt did well with a focus on functional interventions and SL strengthening exercises. No reports of increase pain, she did report some fatigue with the more interventions today. Cues to really push through RLE with step up interventions.     Examination-Activity Limitations  Bend;Sleep    Examination-Participation Restrictions  Cleaning;Meal Prep    Rehab Potential  Excellent    PT Frequency  2x / week    PT Duration  8 weeks    PT Treatment/Interventions  ADLs/Self Care Home Management;Cryotherapy;Dentist;Therapeutic exercise;Balance training;Neuromuscular re-education;Patient/family education;Manual techniques;Passive range of motion;Vasopneumatic Device;Taping    PT Next Visit Plan  ROM/strengthening; gait; Functional strengthening       Patient will benefit from skilled therapeutic intervention in order to  improve the following deficits and impairments:  Pain, Decreased activity tolerance, Decreased range of motion, Decreased strength, Difficulty walking, Increased edema  Visit Diagnosis: Acute pain of right knee  Stiffness of right knee, not elsewhere classified  Localized edema  Difficulty in walking, not elsewhere classified     Problem List Patient Active Problem List   Diagnosis Date Noted  . Primary osteoarthritis of right knee 01/20/2019    Scot Jun 03/26/2019, 9:47 AM  [place], [place], [address] Phone: [phone]   Fax:  [phone]  Name: Robin Hutchinson MRN: [id number] Date of Birth: May 04, 1946

## 2019-03-30 ENCOUNTER — Ambulatory Visit: Payer: Medicare Other | Admitting: Physical Therapy

## 2019-03-30 ENCOUNTER — Other Ambulatory Visit: Payer: Self-pay

## 2019-03-30 ENCOUNTER — Encounter: Payer: Self-pay | Admitting: Physical Therapy

## 2019-03-30 DIAGNOSIS — R600 Localized edema: Secondary | ICD-10-CM

## 2019-03-30 DIAGNOSIS — M25661 Stiffness of right knee, not elsewhere classified: Secondary | ICD-10-CM | POA: Diagnosis not present

## 2019-03-30 DIAGNOSIS — M25561 Pain in right knee: Secondary | ICD-10-CM

## 2019-03-30 DIAGNOSIS — R262 Difficulty in walking, not elsewhere classified: Secondary | ICD-10-CM

## 2019-03-30 NOTE — Therapy (Signed)
[place], [place], [address] Phone: [phone]   Fax:  [phone]  Physical Therapy Treatment  Patient Details  Name: Robin Hutchinson MRN: [id number] Date of Birth: 05-Mar-1946 Referring Provider (PT): Dorna Leitz MD   Encounter Date: 03/30/2019  PT End of Session - 03/30/19 0927    Visit Number  17    Date for PT Re-Evaluation  04/02/19    PT Start Time  0845    PT Stop Time  0942    PT Time Calculation (min)  57 min    Activity Tolerance  Patient tolerated treatment well    Behavior During Therapy  [facility] for tasks assessed/performed       Past Medical History:  Diagnosis Date  . Arthritis     Past Surgical History:  Procedure Laterality Date  . CESAREAN SECTION    . TOTAL KNEE ARTHROPLASTY Right 01/20/2019   Procedure: TOTAL KNEE ARTHROPLASTY;  Surgeon: Dorna Leitz, MD;  Location: [facility];  Service: Orthopedics;  Laterality: Right;    There were no vitals filed for this visit.  Subjective Assessment - 03/30/19 0850    Subjective  "Doing good, only have pain at night"    Currently in Pain?  No/denies                       [facility] Adult PT Treatment/Exercise - 03/30/19 0001      Ambulation/Gait   Stairs  Yes    Stairs Assistance  6: Modified independent (Device/Increase time)    Stair Management Technique  No rails;Alternating pattern    Number of Stairs  48    Height of Stairs  6    Gait Comments  Slow gait speed, some UE use ascendingf      Knee/Hip Exercises: Aerobic   Elliptical  I10 R5 x 5 min     Recumbent Bike  full revolutions x 5      Knee/Hip Exercises: Machines for Strengthening   Cybex Knee Extension  RLE 5lb 2x10     Cybex Leg Press  30lb 2x15, RLE TKE 20lb 2xx15      Knee/Hip Exercises: Standing   Lateral Step Up  Right;10 reps;Hand Hold: 0;Step Height: 6";2 sets      Vasopneumatic   Number Minutes Vasopneumatic   15 minutes    Vasopnuematic  Location   Knee    Vasopneumatic Pressure  Medium    Vasopneumatic Temperature   36      Manual Therapy   Manual Therapy  Passive ROM;Joint mobilization    Joint Mobilization   R knee to aid with ext    Passive ROM  R knee ext               PT Short Term Goals - 02/18/19 1633      PT SHORT TERM GOAL #2   Title  Patient able to ambulate 300 ft safely with SPC.    Time  3    Period  Weeks    Status  Achieved        PT Long Term Goals - 03/19/19 0930      PT LONG TERM GOAL #1   Title  Patient to demo right knee ROM 0-115 deg to normalize ADLS.    Status  Partially Met      PT LONG TERM GOAL #3   Title  Patient able to perform ADLs with 1/10 pain or less  in right knee.    Status  Partially Met      PT LONG TERM GOAL #4   Title  Patient to ambulate community distances with a normal gait pattern without AD.    Status  Partially Met      PT LONG TERM GOAL #5   Title  Patient able to sleep without waking from pain    Status  On-going            Plan - 03/30/19 6015    Clinical Impression Statement  Overall pt is improving towards goals. @ flights of stair with less R knee pain when descending. Good effort with all exercises. She still has some functional R quad weakness noted with stair negotiable. She achieved better R knee extension with closed chain exercises vs open chain.,     Stability/Clinical Decision Making  Stable/Uncomplicated    PT Frequency  2x / week    PT Duration  8 weeks    PT Treatment/Interventions  ADLs/Self Care Home Management;Cryotherapy;Dentist;Therapeutic exercise;Balance training;Neuromuscular re-education;Patient/family education;Manual techniques;Passive range of motion;Vasopneumatic Device;Taping    PT Next Visit Plan  ROM/strengthening; gait; Functional strengthening, Possible D/C next visit      Patient will benefit from skilled therapeutic intervention in order to improve  the following deficits and impairments:  Pain, Decreased activity tolerance, Decreased range of motion, Decreased strength, Difficulty walking, Increased edema  Visit Diagnosis: Acute pain of right knee  Localized edema  Stiffness of right knee, not elsewhere classified  Difficulty in walking, not elsewhere classified     Problem List Patient Active Problem List   Diagnosis Date Noted  . Primary osteoarthritis of right knee 01/20/2019    Scot Jun, PTA 03/30/2019, 9:29 AM  [place], [place], [address] Phone: [phone]   Fax:  [phone]  Name: Robin Hutchinson MRN: [id number] Date of Birth: Feb 07, 1946

## 2019-04-02 ENCOUNTER — Encounter: Payer: Self-pay | Admitting: Physical Therapy

## 2019-04-02 ENCOUNTER — Ambulatory Visit: Payer: Medicare Other | Attending: Orthopedic Surgery | Admitting: Physical Therapy

## 2019-04-02 ENCOUNTER — Other Ambulatory Visit: Payer: Self-pay

## 2019-04-02 DIAGNOSIS — R262 Difficulty in walking, not elsewhere classified: Secondary | ICD-10-CM | POA: Diagnosis present

## 2019-04-02 DIAGNOSIS — M25561 Pain in right knee: Secondary | ICD-10-CM | POA: Insufficient documentation

## 2019-04-02 DIAGNOSIS — M25661 Stiffness of right knee, not elsewhere classified: Secondary | ICD-10-CM | POA: Diagnosis present

## 2019-04-02 DIAGNOSIS — R600 Localized edema: Secondary | ICD-10-CM | POA: Diagnosis present

## 2019-04-02 NOTE — Patient Instructions (Signed)
Access Code: [email or web address]  URL: [email or web address]  Date: 04/02/2019  Prepared by: Cheri Fowler   Exercises  Mini Squat with Counter Support - 10 reps - 2 sets - 2 hold - 1x daily - 7x weekly

## 2019-04-02 NOTE — Therapy (Signed)
[place], [place], [address] Phone: [phone]   Fax:  [phone]  Physical Therapy Treatment  Patient Details  Name: Robin Hutchinson MRN: [id number] Date of Birth: [birth year] Referring Provider (PT): Dorna Leitz MD   Encounter Date: 04/02/2019  PT End of Session - 04/02/19 0925    Visit Number  18    Date for PT Re-Evaluation  04/02/19    PT Start Time  0844    PT Stop Time  0940    PT Time Calculation (min)  56 min    Activity Tolerance  Patient tolerated treatment well    Behavior During Therapy  [facility] for tasks assessed/performed       Past Medical History:  Diagnosis Date  . Arthritis     Past Surgical History:  Procedure Laterality Date  . CESAREAN SECTION    . TOTAL KNEE ARTHROPLASTY Right 01/20/2019   Procedure: TOTAL KNEE ARTHROPLASTY;  Surgeon: Dorna Leitz, MD;  Location: [facility];  Service: Orthopedics;  Laterality: Right;    There were no vitals filed for this visit.  Subjective Assessment - 04/02/19 0844    Subjective  "I am doing all right"    Currently in Pain?  No/denies    Pain Score  0-No pain         [facility] PT Assessment - 04/02/19 0001      ROM / Strength   AROM / PROM / Strength  Strength      AROM   Right Knee Extension  3    Right Knee Flexion  112      Strength   Right Knee Flexion  4+/5    Right Knee Extension  5/5                   [facility] Adult PT Treatment/Exercise - 04/02/19 0001      Knee/Hip Exercises: Aerobic   Elliptical  I10 R5 x 5 min     Recumbent Bike  full revolutions x 5      Knee/Hip Exercises: Machines for Strengthening   Cybex Knee Extension  10lb 2x10     Cybex Knee Flexion  25lb 2x10    Cybex Leg Press  40lb 2x15, RLE TKE 20lb 2xx15      Knee/Hip Exercises: Standing   Heel Raises  Both;2 sets;15 reps;2 seconds    Lateral Step Up  Right;10 reps;Hand Hold: 0;Step Height: 6";2 sets    Other Standing Knee Exercises  mini  squats at sink 2x10       Vasopneumatic   Number Minutes Vasopneumatic   15 minutes    Vasopnuematic Location   Knee    Vasopneumatic Pressure  Medium    Vasopneumatic Temperature   36               PT Short Term Goals - 02/18/19 1633      PT SHORT TERM GOAL #2   Title  Patient able to ambulate 300 ft safely with SPC.    Time  3    Period  Weeks    Status  Achieved        PT Long Term Goals - 04/02/19 3748      PT LONG TERM GOAL #1   Title  Patient to demo right knee ROM 0-115 deg to normalize ADLS.    Status  Partially Met      PT LONG TERM GOAL #2   Title  Patient  to demo 5/5 RLE strength to normalize ADLS    Status  Achieved      PT LONG TERM GOAL #3   Title  Patient able to perform ADLs with 1/10 pain or less in right knee.    Status  Achieved      PT LONG TERM GOAL #4   Title  Patient to ambulate community distances with a normal gait pattern without AD.    Status  Achieved      PT LONG TERM GOAL #5   Title  Patient able to sleep without waking from pain    Status  Achieved            Plan - 04/02/19 0926    Clinical Impression Statement  Most goals met, she still lacks ~ 3 degrees of full R knee extension. She is pleased with her current functional status. Good strength and ROM with all exercises.    Examination-Activity Limitations  Bend;Sleep    Examination-Participation Restrictions  Cleaning;Meal Prep    Stability/Clinical Decision Making  Stable/Uncomplicated    Rehab Potential  Excellent    PT Frequency  2x / week    PT Treatment/Interventions  ADLs/Self Care Home Management;Cryotherapy;Dentist;Therapeutic exercise;Balance training;Neuromuscular re-education;Patient/family education;Manual techniques;Passive range of motion;Vasopneumatic Device;Taping    PT Next Visit Plan  D/C PT       Patient will benefit from skilled therapeutic intervention in order to improve the following  deficits and impairments:  Pain, Decreased activity tolerance, Decreased range of motion, Decreased strength, Difficulty walking, Increased edema  Visit Diagnosis: Acute pain of right knee  Localized edema  Stiffness of right knee, not elsewhere classified  Difficulty in walking, not elsewhere classified     Problem List Patient Active Problem List   Diagnosis Date Noted  . Primary osteoarthritis of right knee 01/20/2019    Scot Jun, PTA 04/02/2019, 9:27 AM  [place], [place], [address] Phone: [phone]   Fax:  [phone]  Name: Shauni Henner MRN: [id number] Date of Birth: [birth year]

## 2020-02-08 ENCOUNTER — Other Ambulatory Visit (HOSPITAL_BASED_OUTPATIENT_CLINIC_OR_DEPARTMENT_OTHER): Payer: Self-pay | Admitting: Family Medicine

## 2020-02-08 DIAGNOSIS — Z1231 Encounter for screening mammogram for malignant neoplasm of breast: Secondary | ICD-10-CM

## 2020-02-08 DIAGNOSIS — M8580 Other specified disorders of bone density and structure, unspecified site: Secondary | ICD-10-CM

## 2020-02-08 DIAGNOSIS — M859 Disorder of bone density and structure, unspecified: Secondary | ICD-10-CM

## 2020-02-14 ENCOUNTER — Encounter (HOSPITAL_BASED_OUTPATIENT_CLINIC_OR_DEPARTMENT_OTHER): Payer: Self-pay

## 2020-02-14 ENCOUNTER — Ambulatory Visit (HOSPITAL_BASED_OUTPATIENT_CLINIC_OR_DEPARTMENT_OTHER)
Admission: RE | Admit: 2020-02-14 | Discharge: 2020-02-14 | Disposition: A | Discharge status: Home / Self Care | Payer: Medicare Other | Source: Ambulatory Visit | Attending: Family Medicine | Admitting: Family Medicine

## 2020-02-14 ENCOUNTER — Other Ambulatory Visit: Payer: Self-pay

## 2020-02-14 DIAGNOSIS — M810 Age-related osteoporosis without current pathological fracture: Secondary | ICD-10-CM | POA: Diagnosis not present

## 2020-02-14 DIAGNOSIS — M859 Disorder of bone density and structure, unspecified: Secondary | ICD-10-CM

## 2020-02-14 DIAGNOSIS — M8580 Other specified disorders of bone density and structure, unspecified site: Secondary | ICD-10-CM

## 2020-02-14 DIAGNOSIS — Z1231 Encounter for screening mammogram for malignant neoplasm of breast: Secondary | ICD-10-CM | POA: Insufficient documentation

## 2020-02-14 IMAGING — MG DIGITAL SCREENING BILAT W/ TOMO W/ CAD
8 series · 8 of 24 positions shown · non-contrast
Comparison: Previous exam(s).

CLINICAL DATA: Screening.

EXAM:
DIGITAL SCREENING BILATERAL MAMMOGRAM WITH TOMO AND CAD

[R CC synth-2D]
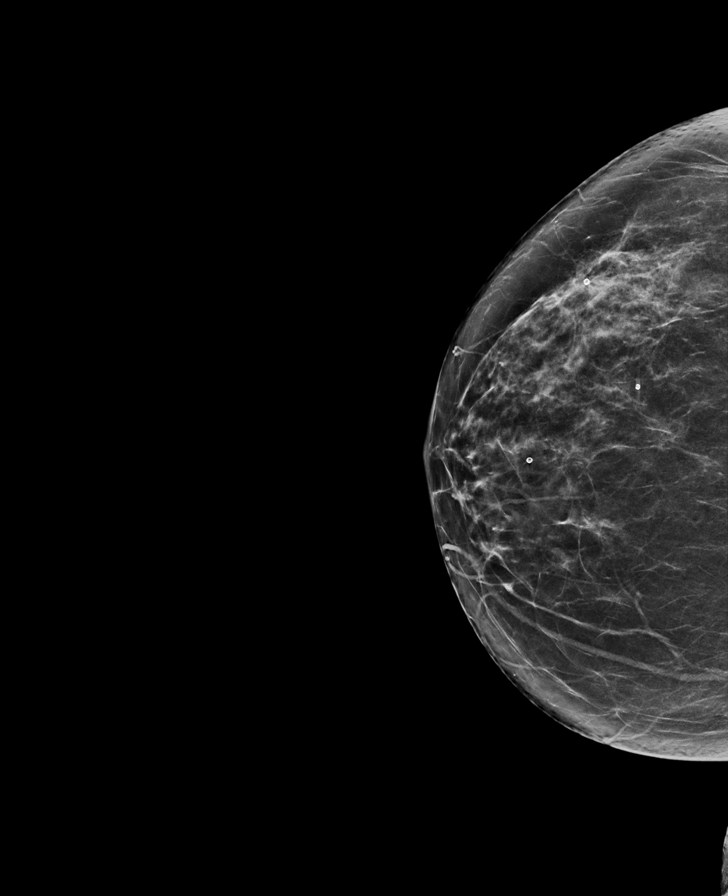

[R MLO synth-2D]
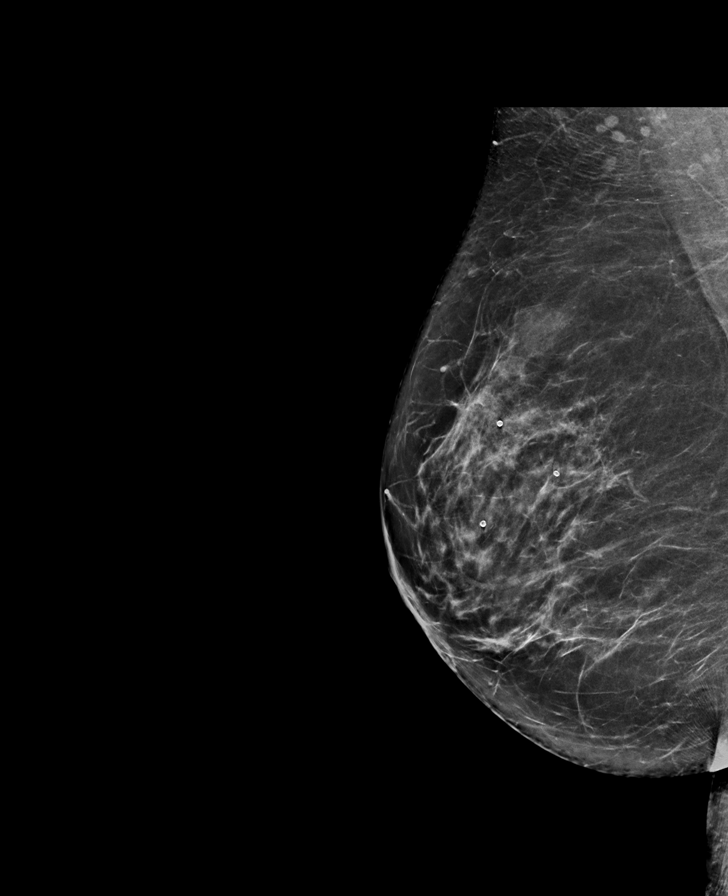

[L CC synth-2D]
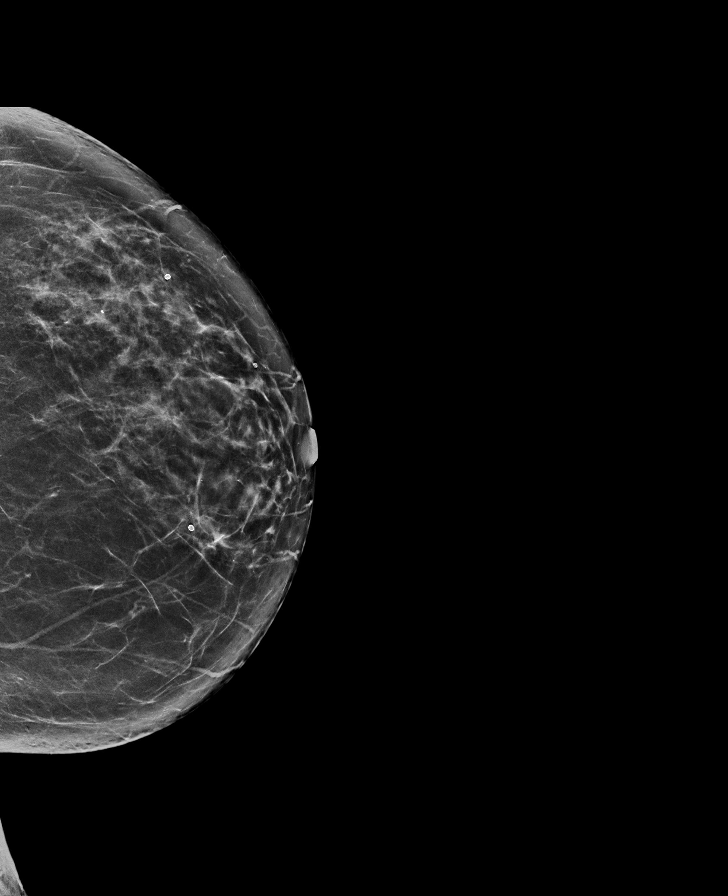

[L MLO synth-2D]
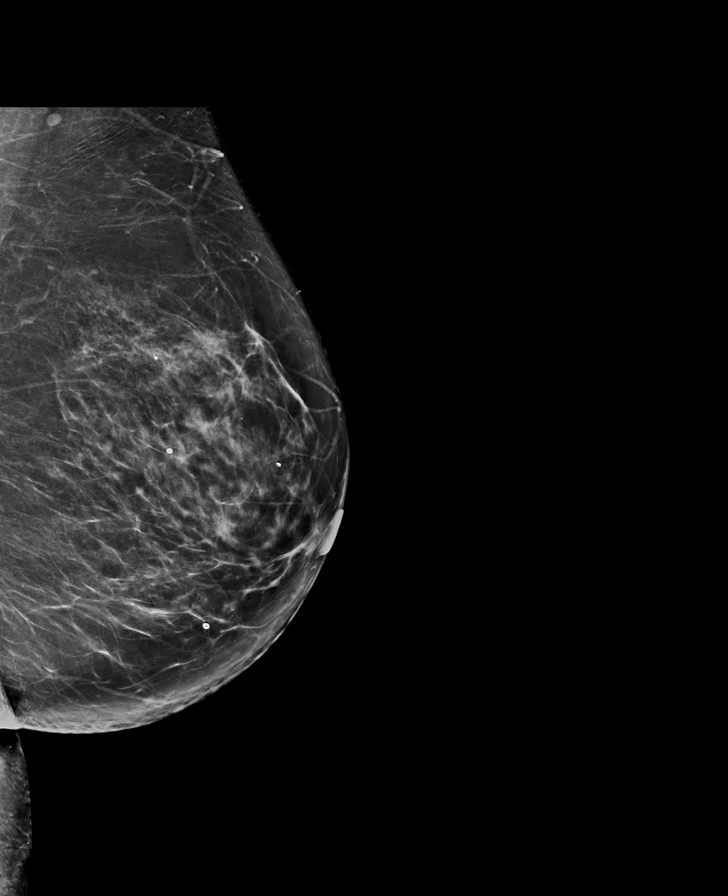

[L CC tomo · tomo slice 37/72.0]
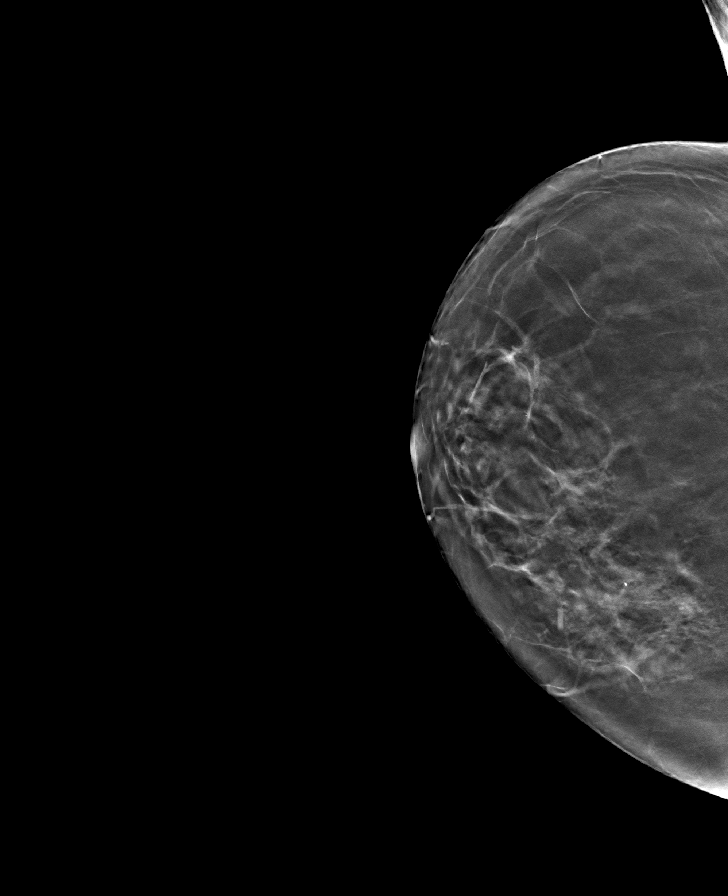

[L MLO tomo · tomo slice 39/76.0]
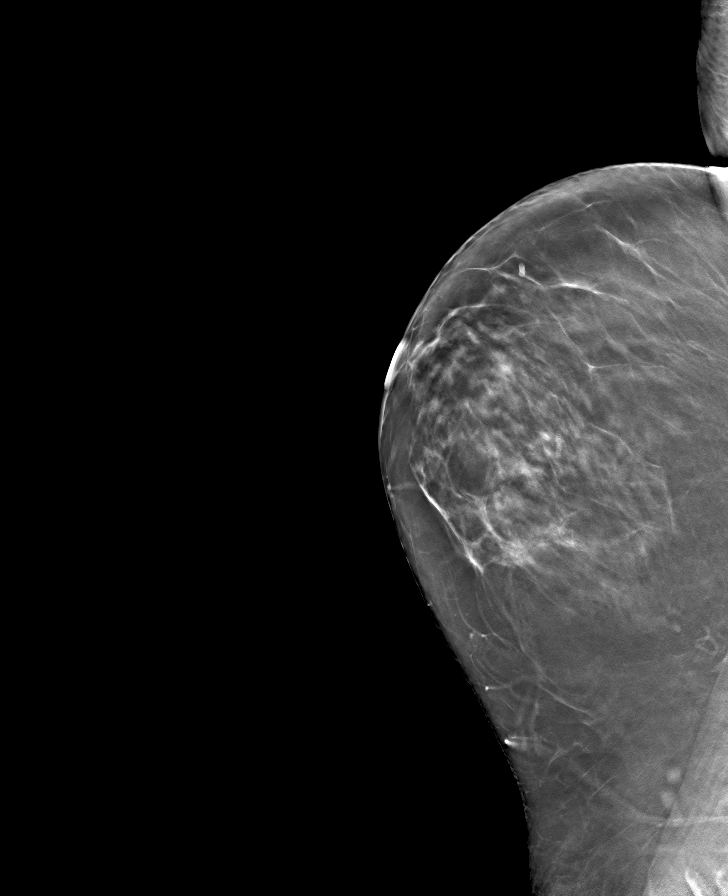

[R CC tomo · tomo slice 35/69.0]
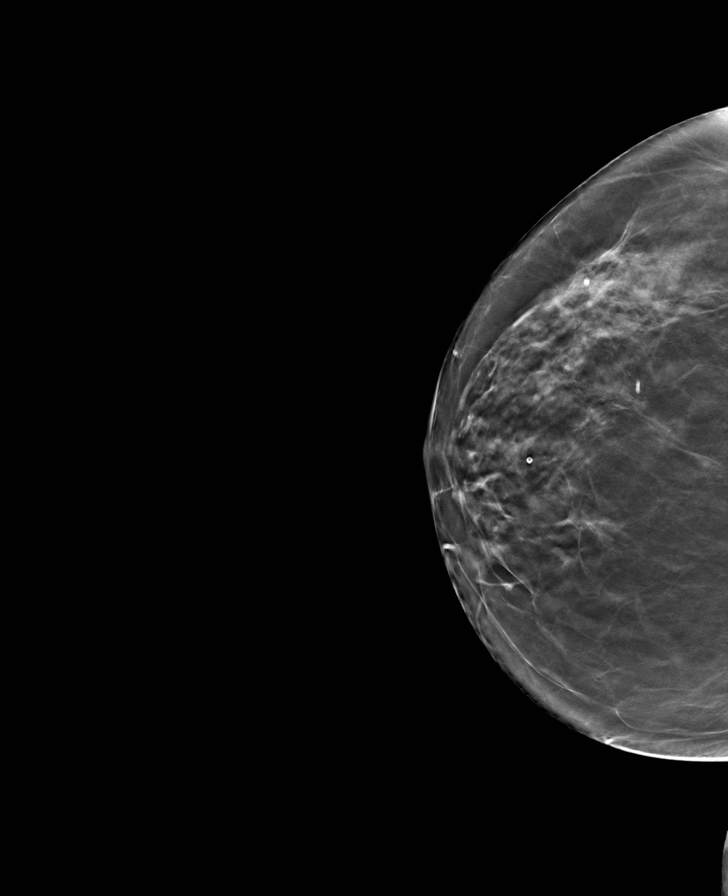

[R MLO tomo · tomo slice 39/77.0]
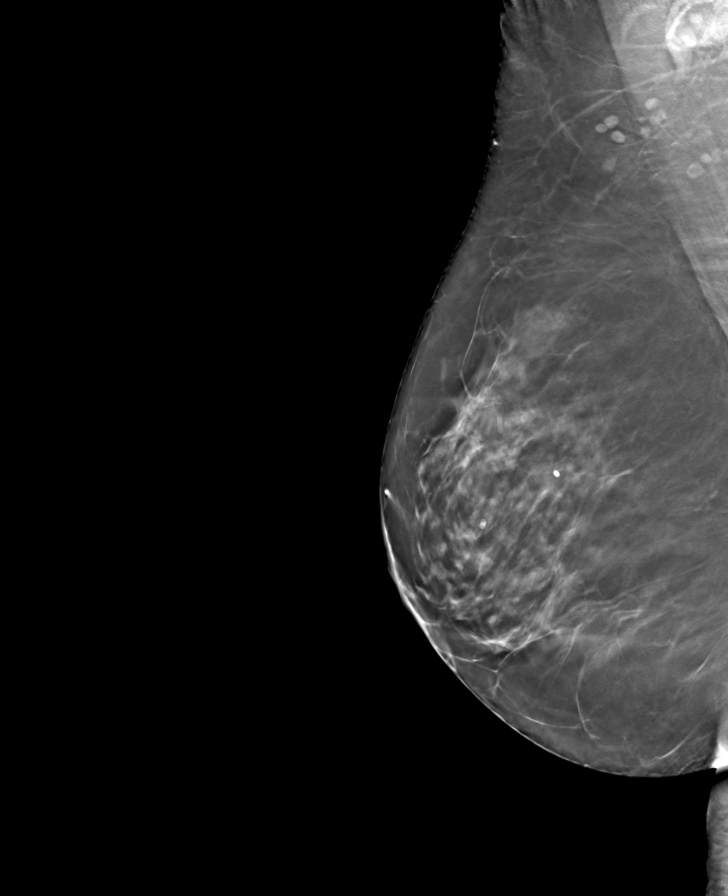

[8 of 24 positions shown; findings below may reference images not displayed]

ACR Breast Density Category b: There are scattered areas of
fibroglandular density.
FINDINGS: There are no findings suspicious for malignancy. Images were
processed with CAD.
IMPRESSION: No mammographic evidence of malignancy. A result letter of this
screening mammogram will be mailed directly to the patient.

RECOMMENDATION:
Screening mammogram in one year. (Code:[TQ])

BI-RADS CATEGORY  1: Negative.

## 2020-04-25 ENCOUNTER — Ambulatory Visit: Payer: Medicare Other | Attending: Orthopedic Surgery | Admitting: Physical Therapy

## 2020-04-25 ENCOUNTER — Other Ambulatory Visit: Payer: Self-pay

## 2020-04-25 ENCOUNTER — Encounter: Payer: Self-pay | Admitting: Physical Therapy

## 2020-04-25 DIAGNOSIS — M542 Cervicalgia: Secondary | ICD-10-CM | POA: Diagnosis present

## 2020-04-25 DIAGNOSIS — M545 Low back pain, unspecified: Secondary | ICD-10-CM

## 2020-04-25 DIAGNOSIS — R252 Cramp and spasm: Secondary | ICD-10-CM | POA: Diagnosis present

## 2020-04-25 NOTE — Therapy (Signed)
[place], [place], [address] Phone: [phone]   Fax:  [phone]  Physical Therapy Evaluation  Patient Details  Name: Robin Hutchinson MRN: [id number] Date of Birth: 01-30-1946 Referring Provider (PT): Graves   Encounter Date: 04/25/2020  PT End of Session - 04/25/20 0939    Visit Number  1    Date for PT Re-Evaluation  06/25/20    PT Start Time  0904    PT Stop Time  0950    PT Time Calculation (min)  46 min    Activity Tolerance  Patient tolerated treatment well    Behavior During Therapy  [facility] for tasks assessed/performed       Past Medical History:  Diagnosis Date  . Arthritis     Past Surgical History:  Procedure Laterality Date  . CESAREAN SECTION    . TOTAL KNEE ARTHROPLASTY Right 01/20/2019   Procedure: TOTAL KNEE ARTHROPLASTY;  Surgeon: Dorna Leitz, MD;  Location: [facility];  Service: Orthopedics;  Laterality: Right;    There were no vitals filed for this visit.   Subjective Assessment - 04/25/20 0906    Subjective  Patient reports that she has had upper trap and neck pain for a long time, reports "feels like muscle" tightness, she reports that at times she cannot turn her head.  She reports that she has had LBP for the past few months, she is unsure of a specific cause, she reports that x-rays show arthritis    Pertinent History  right TKA    Limitations  Lifting;House hold activities    Patient Stated Goals  have less pain, move better and easier    Currently in Pain?  Yes    Pain Score  2     Pain Location  Neck   c/o pain in thot hte upper traps and the low back   Pain Orientation  Left;Right    Pain Descriptors / Indicators  Tightness;Spasm    Pain Type  Acute pain    Pain Radiating Towards  denies    Pain Onset  More than a month ago    Pain Frequency  Constant    Aggravating Factors   driving car, turning to look, difficuty getting up from low seat  pain up to 7/10     Pain Relieving Factors  walk, rest, minimal movements pain be 2/10    Effect of Pain on Daily Activities  difficulty driving, some difficulty cleaning, ADL's         [facility] PT Assessment - 04/25/20 0001      Assessment   Medical Diagnosis  Neck, shoulder and LBP    Referring Provider (PT)  Graves    Onset Date/Surgical Date  03/26/20    Hand Dominance  Right    Prior Therapy  for knees      Precautions   Precautions  None      Balance Screen   Has the patient fallen in the past 6 months  No    Has the patient had a decrease in activity level because of a fear of falling?   No    Is the patient reluctant to leave their home because of a fear of falling?   No      Home Environment   Additional Comments  does the housework      Prior Function   Level of Independence  Independent    Vocation  Retired    Leisure  walks 15 minutes a couple of times a day      Posture/Postural Control   Posture Comments  fwd head, rounded shoulders      ROM / Strength   AROM / PROM / Strength  AROM;Strength      AROM   Overall AROM Comments  Cervical ROM decreased 75% for all motions except rotation decreaesd 50%, shoulder ROM flexion and abduction 110 degrees with c/o tightness in the upper traps, Lumbar spine decreased 50% for all motions with c/o stiffness and tightness      Strength   Overall Strength Comments  3+/5 for the legs and shoulders, c/o some pain, but mostly "just seem weak"      Flexibility   Soft Tissue Assessment /Muscle Length  yes    Hamstrings  tight    ITB  tight    Piriformis  tight      Palpation   Palpation comment  very tight with spasms in the neck, upper traps and in the lumbar area      Transfers   Comments  she is very slow in her transfers, says her legs are weak and her back hurts                  Objective measurements completed on examination: See above findings.      [place] Adult PT Treatment/Exercise - 04/25/20 0001      Modalities    Modalities  Moist Heat;Electrical Stimulation      Moist Heat Therapy   Number Minutes Moist Heat  15 Minutes    Moist Heat Location  Lumbar Spine;Cervical      Electrical Stimulation   Electrical Stimulation Location  cervical and upper trap area    Electrical Stimulation Action  IFC    Electrical Stimulation Parameters  supine    Electrical Stimulation Goals  Pain               PT Short Term Goals - 04/25/20 0942      PT SHORT TERM GOAL #1   Title  Ind with initial HEP    Time  2    Period  Weeks    Status  New        PT Long Term Goals - 04/25/20 0943      PT LONG TERM GOAL #1   Title  increase cervical ROM 25%    Time  8    Period  Weeks    Status  New      PT LONG TERM GOAL #2   Title  Patient to demo 4+/5 RLE strength to normalize ADLS    Time  8    Period  Weeks    Status  New      PT LONG TERM GOAL #3   Title  Patient able to perform ADLs with 2/10 pain or less    Time  8    Period  Weeks    Status  New      PT LONG TERM GOAL #4   Title  increase lumbar ROM 25%    Time  8    Period  Weeks    Status  New      PT LONG TERM GOAL #5   Title  get up from sitting without difficulty and without using hands    Time  8    Period  Weeks    Status  New  Plan - 04/25/20 0939    Clinical Impression Statement  Patient reports that her neck and upper traps have been hurting and tight for a number of years, she reports that the low back has been hurting for about 4 months, she is unsure of a cause but reports that x-rays show arthritis.  She has limited cervical, lumbar and shoulder ROM, she is very tight with spasms in the upper traps and the neck and lumbar area, she has a lot of difficulty with transfers, reporting weak legs and back pain    Stability/Clinical Decision Making  Stable/Uncomplicated    Clinical Decision Making  Low    Rehab Potential  Good    PT Frequency  2x / week    PT Duration  8 weeks    PT  Treatment/Interventions  ADLs/Self Care Home Management;Cryotherapy;Electrical Stimulation;Moist Heat;Traction;Ultrasound;Therapeutic activities;Functional mobility training;Stair training;Patient/family education;Manual techniques;Dry needling    PT Next Visit Plan  slowly start gym activities for strength and function, treat pain and spasms as needed    Consulted and Agree with Plan of Care  Patient       Patient will benefit from skilled therapeutic intervention in order to improve the following deficits and impairments:  Decreased range of motion, Increased muscle spasms, Pain, Impaired UE functional use, Impaired flexibility, Improper body mechanics, Decreased mobility, Decreased strength, Postural dysfunction  Visit Diagnosis: Cervicalgia - Plan: PT plan of care cert/re-cert  Acute bilateral low back pain without sciatica - Plan: PT plan of care cert/re-cert  Cramp and spasm - Plan: PT plan of care cert/re-cert     Problem List Patient Active Problem List   Diagnosis Date Noted  . Primary osteoarthritis of right knee 01/20/2019    Sumner Boast., PT 04/25/2020, 9:55 AM  [place], [place], [address] Phone: [phone]   Fax:  [phone]  Name: Robin Hutchinson MRN: [id number] Date of Birth: 1946-06-03

## 2020-04-25 NOTE — Patient Instructions (Signed)
Access Code: [email or web address] URL: [email or web address] Date: 04/25/2020 Prepared by: Lum Babe  Exercises Supine Single Knee to Chest Stretch - 2 x daily - 7 x weekly - 1 sets - 10 reps - 5 hold Supine Lower Trunk Rotation - 2 x daily - 7 x weekly - 1 sets - 10 reps - 5 hold Supine Piriformis Stretch Pulling Heel to Hip - 2 x daily - 7 x weekly - 1 sets - 10 reps - 3 hold Seated Shoulder Shrugs - 2 x daily - 7 x weekly - 1 sets - 10 reps - 3 hold Seated Scapular Retraction - 2 x daily - 7 x weekly - 1 sets - 10 reps - 10 hold Seated Gentle Upper Trapezius Stretch - 2 x daily - 7 x weekly - 1 sets - 10 reps - 10 hold

## 2020-04-27 ENCOUNTER — Ambulatory Visit: Payer: Medicare Other | Admitting: Physical Therapy

## 2020-04-27 ENCOUNTER — Other Ambulatory Visit: Payer: Self-pay

## 2020-04-27 DIAGNOSIS — R252 Cramp and spasm: Secondary | ICD-10-CM

## 2020-04-27 DIAGNOSIS — M542 Cervicalgia: Secondary | ICD-10-CM | POA: Diagnosis not present

## 2020-04-27 DIAGNOSIS — M545 Low back pain, unspecified: Secondary | ICD-10-CM

## 2020-04-27 NOTE — Therapy (Signed)
[place], [place], [address] Phone: [phone]   Fax:  [phone]  Physical Therapy Treatment  Patient Details  Name: Robin Hutchinson MRN: [id number] Date of Birth: 1946/06/14 Referring Provider (PT): Graves   Encounter Date: 04/27/2020  PT End of Session - 04/27/20 0916    Visit Number  2    Date for PT Re-Evaluation  06/25/20    PT Start Time  0845    PT Stop Time  0935    PT Time Calculation (min)  50 min       Past Medical History:  Diagnosis Date  . Arthritis     Past Surgical History:  Procedure Laterality Date  . CESAREAN SECTION    . TOTAL KNEE ARTHROPLASTY Right 01/20/2019   Procedure: TOTAL KNEE ARTHROPLASTY;  Surgeon: Dorna Leitz, MD;  Location: [facility];  Service: Orthopedics;  Laterality: Right;    There were no vitals filed for this visit.  Subjective Assessment - 04/27/20 0855    Subjective  felt good after last session, doing okay this morning- tightness L>RT. ? with HEP    Currently in Pain?  Yes    Pain Score  2     Pain Location  Neck                        [facility] Adult PT Treatment/Exercise - 04/27/20 0001      Exercises   Exercises  Neck;Shoulder      Neck Exercises: Machines for Strengthening   UBE (Upper Arm Bike)  L 3 2 min fwd and 2 min back    Cybex Row  15# 10x    Lat Pull  10x 10x      Neck Exercises: Standing   Other Standing Exercises  3# scap squeeze,shruggs and backward rolls 10 each    Other Standing Exercises  scap stab 3 way with red tband 10 each with cuing needed       Moist Heat Therapy   Number Minutes Moist Heat  15 Minutes    Moist Heat Location  Lumbar Spine;Cervical      Electrical Stimulation   Electrical Stimulation Location  cervical and upper trap area    Electrical Stimulation Action  IFC    Electrical Stimulation Parameters  supine    Electrical Stimulation Goals  Pain      Manual Therapy   Manual Therapy   Soft tissue mobilization    Manual therapy comments  tightness BIL, left tighter and more painful    Soft tissue mobilization  BIL cerv and trap             PT Education - 04/27/20 0915    Education Details  HEP from eval    Person(s) Educated  Patient    Methods  Explanation;Demonstration;Verbal cues    Comprehension  Verbalized understanding;Returned demonstration       PT Short Term Goals - 04/27/20 0919      PT SHORT TERM GOAL #1   Title  Ind with initial HEP    Status  Achieved        PT Long Term Goals - 04/25/20 0943      PT LONG TERM GOAL #1   Title  increase cervical ROM 25%    Time  8    Period  Weeks    Status  New      PT LONG TERM GOAL #2  Title  Patient to demo 4+/5 RLE strength to normalize ADLS    Time  8    Period  Weeks    Status  New      PT LONG TERM GOAL #3   Title  Patient able to perform ADLs with 2/10 pain or less    Time  8    Period  Weeks    Status  New      PT LONG TERM GOAL #4   Title  increase lumbar ROM 25%    Time  8    Period  Weeks    Status  New      PT LONG TERM GOAL #5   Title  get up from sitting without difficulty and without using hands    Time  8    Period  Weeks    Status  New            Plan - 04/27/20 0160    Clinical Impression Statement  pt with questions with HEP so reviewed all issued ex at eval and after pt had better understanding.tolerated initial strengthening progression fair, weakness and psotural cuing needed. STW to BIL cerv and UT left>RT with tightness and pain. relief with modalities. STG met    PT Treatment/Interventions  ADLs/Self Care Home Management;Cryotherapy;Electrical Stimulation;Moist Heat;Traction;Ultrasound;Therapeutic activities;Functional mobility training;Stair training;Patient/family education;Manual techniques;Dry needling    PT Next Visit Plan  slowly start gym activities for strength and function, treat pain and spasms as needed       Patient will benefit from  skilled therapeutic intervention in order to improve the following deficits and impairments:  Decreased range of motion, Increased muscle spasms, Pain, Impaired UE functional use, Impaired flexibility, Improper body mechanics, Decreased mobility, Decreased strength, Postural dysfunction  Visit Diagnosis: Acute bilateral low back pain without sciatica  Cervicalgia  Cramp and spasm     Problem List Patient Active Problem List   Diagnosis Date Noted  . Primary osteoarthritis of right knee 01/20/2019    PAYSEUR,ANGIE PTA 04/27/2020, 9:20 AM  [place], [place], [address] Phone: [phone]   Fax:  [phone]  Name: Robin Hutchinson MRN: [id number] Date of Birth: 09-07-1946

## 2020-05-03 ENCOUNTER — Ambulatory Visit: Payer: Medicare Other | Attending: Orthopedic Surgery | Admitting: Physical Therapy

## 2020-05-03 ENCOUNTER — Other Ambulatory Visit: Payer: Self-pay

## 2020-05-03 ENCOUNTER — Encounter: Payer: Self-pay | Admitting: Physical Therapy

## 2020-05-03 DIAGNOSIS — M542 Cervicalgia: Secondary | ICD-10-CM | POA: Diagnosis present

## 2020-05-03 DIAGNOSIS — M545 Low back pain, unspecified: Secondary | ICD-10-CM

## 2020-05-03 DIAGNOSIS — R252 Cramp and spasm: Secondary | ICD-10-CM

## 2020-05-03 NOTE — Therapy (Signed)
[place], [place], [address] Phone: [phone]   Fax:  [phone]  Physical Therapy Treatment  Patient Details  Name: Robin Hutchinson MRN: [id number] Date of Birth: [birth year] Referring Provider (PT): Graves   Encounter Date: 05/03/2020  PT End of Session - 05/03/20 0924    Visit Number  3    Date for PT Re-Evaluation  06/25/20    PT Start Time  0843    PT Stop Time  0939    PT Time Calculation (min)  56 min    Activity Tolerance  Patient tolerated treatment well    Behavior During Therapy  [facility] for tasks assessed/performed       Past Medical History:  Diagnosis Date   Arthritis     Past Surgical History:  Procedure Laterality Date   CESAREAN SECTION     TOTAL KNEE ARTHROPLASTY Right 01/20/2019   Procedure: TOTAL KNEE ARTHROPLASTY;  Surgeon: Dorna Leitz, MD;  Location: [facility];  Service: Orthopedics;  Laterality: Right;    There were no vitals filed for this visit.  Subjective Assessment - 05/03/20 0845    Subjective  Patient reports that she is continuing to feel better, c/o soreness in the low back and the hips    Currently in Pain?  Yes    Pain Score  3     Pain Location  Back    Pain Orientation  Lower;Right;Left    Pain Descriptors / Indicators  Sore;Tightness    Aggravating Factors   sitting                        [facility] Adult PT Treatment/Exercise - 05/03/20 0001      Neck Exercises: Machines for Strengthening   UBE (Upper Arm Bike)  L 3 2 min fwd and 2 min back    Nustep  level 4 x 6 minutes    Cybex Row  15# 2x10    Lat Pull  15# 2x10    Other Machines for Strengthening  leg press 20# 2x10    Other Machines for Strengthening  leg curls 15# 2x10, leg extension 5#x10      Neck Exercises: Standing   Other Standing Exercises  3# scap squeeze,shruggs and backward rolls 10 each, upper trap and levator stretches    Other Standing Exercises  scap stab 3 way  with red tband 10 each with cuing needed  W backs      Moist Heat Therapy   Number Minutes Moist Heat  12 Minutes    Moist Heat Location  Lumbar Spine;Cervical      Electrical Stimulation   Electrical Stimulation Location  cervical and upper trap area    Electrical Stimulation Action  IFC    Electrical Stimulation Parameters  supine    Electrical Stimulation Goals  Pain      Manual Therapy   Manual Therapy  Soft tissue mobilization    Manual therapy comments  tightness BIL, left tighter and more painful    Soft tissue mobilization  BIL cerv and trap               PT Short Term Goals - 04/27/20 0919      PT SHORT TERM GOAL #1   Title  Ind with initial HEP    Status  Achieved        PT Long Term Goals - 05/03/20 [id number]      PT  LONG TERM GOAL #1   Title  increase cervical ROM 25%    Status  On-going      PT LONG TERM GOAL #2   Title  Patient to demo 4+/5 RLE strength to normalize ADLS    Status  On-going            Plan - 05/03/20 [id number]    Clinical Impression Statement  Has some increased c/o tightness and pain in the low bakc and the hips, added some LE strength as patient reports she has difficulty getting up ffrom sitting without using her arms which could put stress on the shoulders and the neck.  She remains very tight in the upper traps and the cervical area, she does need a lot of cues for exercises    PT Next Visit Plan  slowly start gym activities for strength and function, treat pain and spasms as needed    Consulted and Agree with Plan of Care  Patient       Patient will benefit from skilled therapeutic intervention in order to improve the following deficits and impairments:  Decreased range of motion, Increased muscle spasms, Pain, Impaired UE functional use, Impaired flexibility, Improper body mechanics, Decreased mobility, Decreased strength, Postural dysfunction  Visit Diagnosis: Acute bilateral low back pain without  sciatica  Cervicalgia  Cramp and spasm     Problem List Patient Active Problem List   Diagnosis Date Noted   Primary osteoarthritis of right knee 01/20/2019    Sumner Boast., PT 05/03/2020, 9:27 AM  [place], [place], [address] Phone: [phone]   Fax:  [phone]  Name: Robin Hutchinson MRN: [id number] Date of Birth: 12/05/1945

## 2020-05-04 ENCOUNTER — Ambulatory Visit: Payer: Medicare Other | Admitting: Physical Therapy

## 2020-05-04 DIAGNOSIS — M545 Low back pain, unspecified: Secondary | ICD-10-CM

## 2020-05-04 DIAGNOSIS — M542 Cervicalgia: Secondary | ICD-10-CM

## 2020-05-04 DIAGNOSIS — R252 Cramp and spasm: Secondary | ICD-10-CM

## 2020-05-04 NOTE — Therapy (Signed)
[place], [place], [address] Phone: [phone]   Fax:  [phone]  Physical Therapy Treatment  Patient Details  Name: Robin Hutchinson MRN: [id number] Date of Birth: [birth year] Referring Provider (PT): Graves   Encounter Date: 05/04/2020  PT End of Session - 05/04/20 1006    Visit Number  4    Date for PT Re-Evaluation  06/25/20    PT Start Time  0927    PT Stop Time  1025    PT Time Calculation (min)  58 min       Past Medical History:  Diagnosis Date  . Arthritis     Past Surgical History:  Procedure Laterality Date  . CESAREAN SECTION    . TOTAL KNEE ARTHROPLASTY Right 01/20/2019   Procedure: TOTAL KNEE ARTHROPLASTY;  Surgeon: Dorna Leitz, MD;  Location: [facility];  Service: Orthopedics;  Laterality: Right;    There were no vitals filed for this visit.  Subjective Assessment - 05/04/20 0930    Subjective  shld/neck looser, LBP about the same    Currently in Pain?  Yes    Pain Score  3                         [facility] Adult PT Treatment/Exercise - 05/04/20 0001      Exercises   Exercises  Lumbar      Neck Exercises: Machines for Strengthening   UBE (Upper Arm Bike)  L 3 2 min fwd and 2 min back    Nustep  level 4 x 6 minutes    Other Machines for Strengthening  sitting on sit fit pelvic ROM, scap and LE and  stab ex       Neck Exercises: Supine   Neck Retraction  10 reps;3 secs      Lumbar Exercises: Supine   Ab Set  15 reps;3 seconds   with ball   Other Supine Lumbar Exercises  feet on ball bridge, KTC and obl      Electrical Stimulation   Electrical Stimulation Location  cerv and lumb    Electrical Stimulation Action  premod    Electrical Stimulation Parameters  sitting    Electrical Stimulation Goals  Pain      Manual Therapy   Manual Therapy  Soft tissue mobilization;Passive ROM    Manual therapy comments  tightness BIL trap Left >RT and HS and ITB  tightness noted    Soft tissue mobilization  BIL cerv and trap    Passive ROM  LE and back               PT Short Term Goals - 04/27/20 0919      PT SHORT TERM GOAL #1   Title  Ind with initial HEP    Status  Achieved        PT Long Term Goals - 05/04/20 1007      PT LONG TERM GOAL #1   Title  increase cervical ROM 25%    Status  On-going      PT LONG TERM GOAL #2   Title  Patient to demo 4+/5 RLE strength to normalize ADLS    Status  On-going      PT LONG TERM GOAL #3   Title  Patient able to perform ADLs with 2/10 pain or less    Status  On-going      PT LONG TERM GOAL #4  Title  increase lumbar ROM 25%    Status  On-going      PT LONG TERM GOAL #5   Title  get up from sitting without difficulty and without using hands    Status  On-going            Plan - 05/04/20 1008    Clinical Impression Statement  pt verb neck/shld feels looser but LB aboutthe same- increased core stab ex and PROM to LE and trunk. educ on cerv/lumb support when sleeping. progress ex and stab and working towards goals. Continued relief with modalities-added lumb today    PT Treatment/Interventions  ADLs/Self Care Home Management;Cryotherapy;Electrical Stimulation;Moist Heat;Traction;Ultrasound;Therapeutic activities;Functional mobility training;Stair training;Patient/family education;Manual techniques;Dry needling    PT Next Visit Plan  assess and progress       Patient will benefit from skilled therapeutic intervention in order to improve the following deficits and impairments:  Decreased range of motion, Increased muscle spasms, Pain, Impaired UE functional use, Impaired flexibility, Improper body mechanics, Decreased mobility, Decreased strength, Postural dysfunction  Visit Diagnosis: Acute bilateral low back pain without sciatica  Cervicalgia  Cramp and spasm     Problem List Patient Active Problem List   Diagnosis Date Noted  . Primary osteoarthritis of right knee  01/20/2019    Robin Hutchinson,ANGIE PTA 05/04/2020, 10:10 AM  [place], [place], [address] Phone: [phone]   Fax:  [phone]  Name: Robin Hutchinson MRN: [id number] Date of Birth: 1946/11/25

## 2020-05-09 ENCOUNTER — Other Ambulatory Visit: Payer: Self-pay

## 2020-05-09 ENCOUNTER — Ambulatory Visit: Payer: Medicare Other | Admitting: Physical Therapy

## 2020-05-09 DIAGNOSIS — R252 Cramp and spasm: Secondary | ICD-10-CM

## 2020-05-09 DIAGNOSIS — M545 Low back pain, unspecified: Secondary | ICD-10-CM

## 2020-05-09 DIAGNOSIS — M542 Cervicalgia: Secondary | ICD-10-CM

## 2020-05-09 NOTE — Therapy (Signed)
[place], [place], [address] Phone: [phone]   Fax:  [phone]  Physical Therapy Treatment  Patient Details  Name: Robin Hutchinson MRN: [id number] Date of Birth: 1946/03/15 Referring Provider (PT): Graves   Encounter Date: 05/09/2020  PT End of Session - 05/09/20 0926    Visit Number  5    Date for PT Re-Evaluation  06/25/20    PT Start Time  0845    PT Stop Time  0940    PT Time Calculation (min)  55 min       Past Medical History:  Diagnosis Date  . Arthritis     Past Surgical History:  Procedure Laterality Date  . CESAREAN SECTION    . TOTAL KNEE ARTHROPLASTY Right 01/20/2019   Procedure: TOTAL KNEE ARTHROPLASTY;  Surgeon: Dorna Leitz, MD;  Location: [facility];  Service: Orthopedics;  Laterality: Right;    There were no vitals filed for this visit.  Subjective Assessment - 05/09/20 0845    Subjective  neck/shld much better, LBP esp with sitting greater than 30 min- last tx helped    Currently in Pain?  Yes    Pain Score  2     Pain Location  Back    Pain Orientation  Lower                        [facility] Adult PT Treatment/Exercise - 05/09/20 0001      Lumbar Exercises: Aerobic   Nustep  L 5 6 min      Lumbar Exercises: Seated   Sit to Stand  10 reps   wt ball chest press 10 x   Other Seated Lumbar Exercises  sit fit pelvic ROM 15 times 4 way      Lumbar Exercises: Supine   Ab Set  15 reps;3 seconds    Clam  15 reps   red tband   Bent Knee Raise  15 reps   red tband   Other Supine Lumbar Exercises  feet on ball bridge, KTC and obl   15 x each     Electrical Stimulation   Electrical Stimulation Location  cerv and lumb    Electrical Stimulation Action  premod    Electrical Stimulation Parameters  supine    Electrical Stimulation Goals  Pain      Manual Therapy   Passive ROM  LE and trunk             PT Education - 05/09/20 0859    Education  Details  red tband scap stab and hip 3 way    Person(s) Educated  Patient    Methods  Explanation;Demonstration;Verbal cues;Handout    Comprehension  Verbalized understanding;Returned demonstration       PT Short Term Goals - 04/27/20 0919      PT SHORT TERM GOAL #1   Title  Ind with initial HEP    Status  Achieved        PT Long Term Goals - 05/04/20 1007      PT LONG TERM GOAL #1   Title  increase cervical ROM 25%    Status  On-going      PT LONG TERM GOAL #2   Title  Patient to demo 4+/5 RLE strength to normalize ADLS    Status  On-going      PT LONG TERM GOAL #3   Title  Patient able to perform ADLs  with 2/10 pain or less    Status  On-going      PT LONG TERM GOAL #4   Title  increase lumbar ROM 25%    Status  On-going      PT LONG TERM GOAL #5   Title  get up from sitting without difficulty and without using hands    Status  On-going            Plan - 05/09/20 0926    Clinical Impression Statement  added to HEP for spinal stab and strengthening,needed cuinga nd handout provided. pt verb neck/shld better but more c/o LBP and noted HS tightness. tolerated ther ex well.    PT Treatment/Interventions  ADLs/Self Care Home Management;Cryotherapy;Electrical Stimulation;Moist Heat;Traction;Ultrasound;Therapeutic activities;Functional mobility training;Stair training;Patient/family education;Manual techniques;Dry needling    PT Next Visit Plan  check HEP and goals       Patient will benefit from skilled therapeutic intervention in order to improve the following deficits and impairments:  Decreased range of motion, Increased muscle spasms, Pain, Impaired UE functional use, Impaired flexibility, Improper body mechanics, Decreased mobility, Decreased strength, Postural dysfunction  Visit Diagnosis: Acute bilateral low back pain without sciatica  Cramp and spasm  Cervicalgia     Problem List Patient Active Problem List   Diagnosis Date Noted  . Primary  osteoarthritis of right knee 01/20/2019    Jabez Molner,ANGIE PTA 05/09/2020, 9:28 AM  [place], [place], [address] Phone: [phone]   Fax:  [phone]  Name: Robin Hutchinson MRN: [id number] Date of Birth: [birth year]

## 2020-05-11 ENCOUNTER — Other Ambulatory Visit: Payer: Self-pay

## 2020-05-11 ENCOUNTER — Ambulatory Visit: Payer: Medicare Other | Admitting: Physical Therapy

## 2020-05-11 DIAGNOSIS — R252 Cramp and spasm: Secondary | ICD-10-CM

## 2020-05-11 DIAGNOSIS — M545 Low back pain, unspecified: Secondary | ICD-10-CM

## 2020-05-11 DIAGNOSIS — M542 Cervicalgia: Secondary | ICD-10-CM

## 2020-05-11 NOTE — Therapy (Signed)
[facility] [place], [place], [address] Phone: [phone]   Fax:  [phone]  Physical Therapy Treatment  Patient Details  Name: Robin Hutchinson MRN: [id number] Date of Birth: 1946-10-14 Referring Provider (PT): Berenice Primas   Encounter Date: 05/11/2020   PT End of Session - 05/11/20 0920    Visit Number 6    Date for PT Re-Evaluation 06/25/20    PT Start Time 0845    PT Stop Time 0942    PT Time Calculation (min) 57 min           Past Medical History:  Diagnosis Date  . Arthritis     Past Surgical History:  Procedure Laterality Date  . CESAREAN SECTION    . TOTAL KNEE ARTHROPLASTY Right 01/20/2019   Procedure: TOTAL KNEE ARTHROPLASTY;  Surgeon: Dorna Leitz, MD;  Location: [facility];  Service: Orthopedics;  Laterality: Right;    There were no vitals filed for this visit.   Subjective Assessment - 05/11/20 0843    Subjective better-sore after ex but they are helping. heat and estim help. 50% better    Currently in Pain? Yes    Pain Score 2     Pain Location Back                             [facility] Adult PT Treatment/Exercise - 05/11/20 0001      Lumbar Exercises: Aerobic   Nustep L 5 7 min      Lumbar Exercises: Machines for Strengthening   Cybex Lumbar Extension black band 2 sets 10   trunk flex 2 sets 10   Cybex Knee Extension 10# 2 sets 10    Cybex Knee Flexion 15#  2 sets 10    Other Lumbar Machine Exercise lat pull and row 15# 2 sets 10      Lumbar Exercises: Seated   Sit to Stand 10 reps   wt ball chest press from mat   Other Seated Lumbar Exercises sit fit pelvic ROM 15 times 4 way      Moist Heat Therapy   Number Minutes Moist Heat 15 Minutes    Moist Heat Location Cervical;Lumbar Spine      Electrical Stimulation   Electrical Stimulation Location cerv and lumb    Electrical Stimulation Action premod    Electrical Stimulation Parameters supine    Electrical  Stimulation Goals Pain      Manual Therapy   Manual Therapy Passive ROM    Passive ROM LE and trunk                    PT Short Term Goals - 04/27/20 0919      PT SHORT TERM GOAL #1   Title Ind with initial HEP    Status Achieved             PT Long Term Goals - 05/11/20 0909      PT LONG TERM GOAL #1   Title increase cervical ROM 25%    Baseline WFLs    Status Achieved      PT LONG TERM GOAL #2   Title Patient to demo 4+/5 RLE strength to normalize ADLS    Status On-going      PT LONG TERM GOAL #3   Title Patient able to perform ADLs with 2/10 pain or less    Status Partially Met  PT LONG TERM GOAL #4   Title increase lumbar ROM 25%    Baseline ext WFLs, SB an dflex decreased 25% d/t tightness    Status Partially Met      PT LONG TERM GOAL #5   Title get up from sitting without difficulty and without using hands    Baseline depends on surface ie. pt reports sofa is difficult    Status Partially Met                 Plan - 05/11/20 0920    Clinical Impression Statement progressing with LTGS. overall reports 50% better-still c/o pain with sitting and getting up from sitting and early morning. started machines strengtheing today, HS tightness limited back ROM    PT Treatment/Interventions ADLs/Self Care Home Management;Cryotherapy;Electrical Stimulation;Moist Heat;Traction;Ultrasound;Therapeutic activities;Functional mobility training;Stair training;Patient/family education;Manual techniques;Dry needling    PT Next Visit Plan dynamic HS/back ex           Patient will benefit from skilled therapeutic intervention in order to improve the following deficits and impairments:  Decreased range of motion, Increased muscle spasms, Pain, Impaired UE functional use, Impaired flexibility, Improper body mechanics, Decreased mobility, Decreased strength, Postural dysfunction  Visit Diagnosis: Acute bilateral low back pain without sciatica  Cramp and  spasm  Cervicalgia     Problem List Patient Active Problem List   Diagnosis Date Noted  . Primary osteoarthritis of right knee 01/20/2019    PAYSEUR,ANGIE PTA 05/11/2020, 9:25 AM  [facility] [address] [place], [place], [address] Phone: [phone]   Fax:  [phone]  Name: Robin Hutchinson MRN: [id number] Date of Birth: 09/16/1946   

## 2021-02-13 DIAGNOSIS — E7800 Pure hypercholesterolemia, unspecified: Secondary | ICD-10-CM | POA: Diagnosis not present

## 2021-02-13 DIAGNOSIS — R739 Hyperglycemia, unspecified: Secondary | ICD-10-CM | POA: Diagnosis not present

## 2021-02-13 DIAGNOSIS — M1990 Unspecified osteoarthritis, unspecified site: Secondary | ICD-10-CM | POA: Diagnosis not present

## 2021-02-13 DIAGNOSIS — M810 Age-related osteoporosis without current pathological fracture: Secondary | ICD-10-CM | POA: Diagnosis not present

## 2021-02-13 DIAGNOSIS — Z79899 Other long term (current) drug therapy: Secondary | ICD-10-CM | POA: Diagnosis not present

## 2021-02-13 DIAGNOSIS — Z0000 Encounter for general adult medical examination without abnormal findings: Secondary | ICD-10-CM | POA: Diagnosis not present

## 2021-02-19 ENCOUNTER — Other Ambulatory Visit (HOSPITAL_BASED_OUTPATIENT_CLINIC_OR_DEPARTMENT_OTHER): Payer: Self-pay | Admitting: Family Medicine

## 2021-02-19 DIAGNOSIS — Z1231 Encounter for screening mammogram for malignant neoplasm of breast: Secondary | ICD-10-CM

## 2021-02-19 DIAGNOSIS — Z1211 Encounter for screening for malignant neoplasm of colon: Secondary | ICD-10-CM | POA: Diagnosis not present

## 2021-02-20 ENCOUNTER — Other Ambulatory Visit: Payer: Self-pay

## 2021-02-20 ENCOUNTER — Encounter (HOSPITAL_BASED_OUTPATIENT_CLINIC_OR_DEPARTMENT_OTHER): Payer: Self-pay

## 2021-02-20 ENCOUNTER — Ambulatory Visit (HOSPITAL_BASED_OUTPATIENT_CLINIC_OR_DEPARTMENT_OTHER)
Admission: RE | Admit: 2021-02-20 | Discharge: 2021-02-20 | Disposition: A | Discharge status: Home / Self Care | Payer: Medicare Other | Source: Ambulatory Visit | Attending: Family Medicine | Admitting: Family Medicine

## 2021-02-20 DIAGNOSIS — Z1231 Encounter for screening mammogram for malignant neoplasm of breast: Secondary | ICD-10-CM | POA: Insufficient documentation

## 2021-02-20 IMAGING — MG MM DIGITAL SCREENING BILAT W/ TOMO AND CAD
8 series · 8 of 24 positions shown · non-contrast
Comparison: Previous exam(s).

CLINICAL DATA: Screening.

EXAM:
DIGITAL SCREENING BILATERAL MAMMOGRAM WITH TOMOSYNTHESIS AND CAD
TECHNIQUE: Bilateral screening digital craniocaudal and mediolateral oblique
mammograms were obtained. Bilateral screening digital breast
tomosynthesis was performed. The images were evaluated with
computer-aided detection.

[L MLO synth-2D]
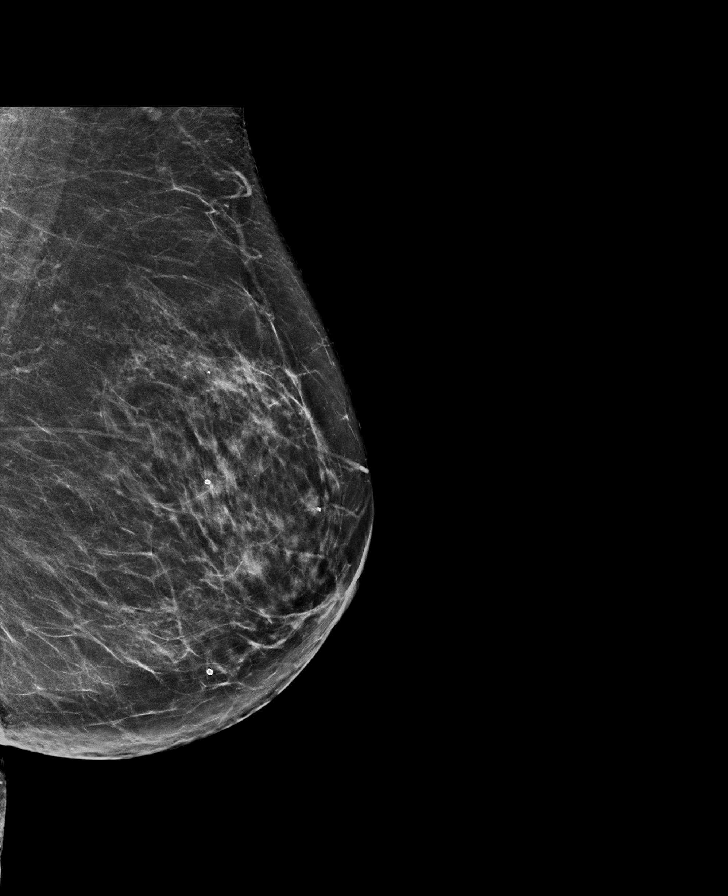

[R MLO synth-2D]
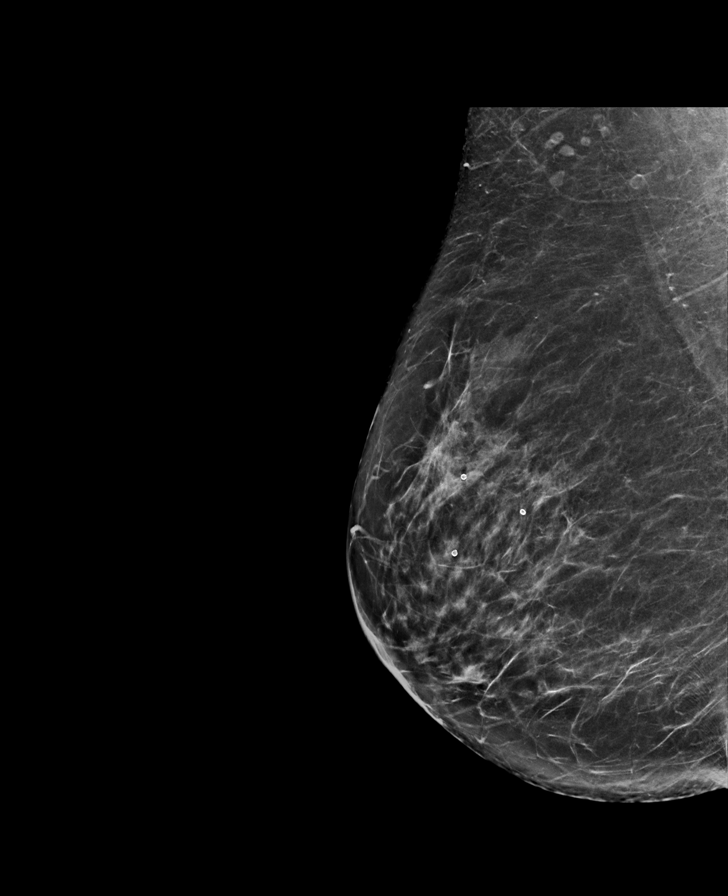

[R CC synth-2D]
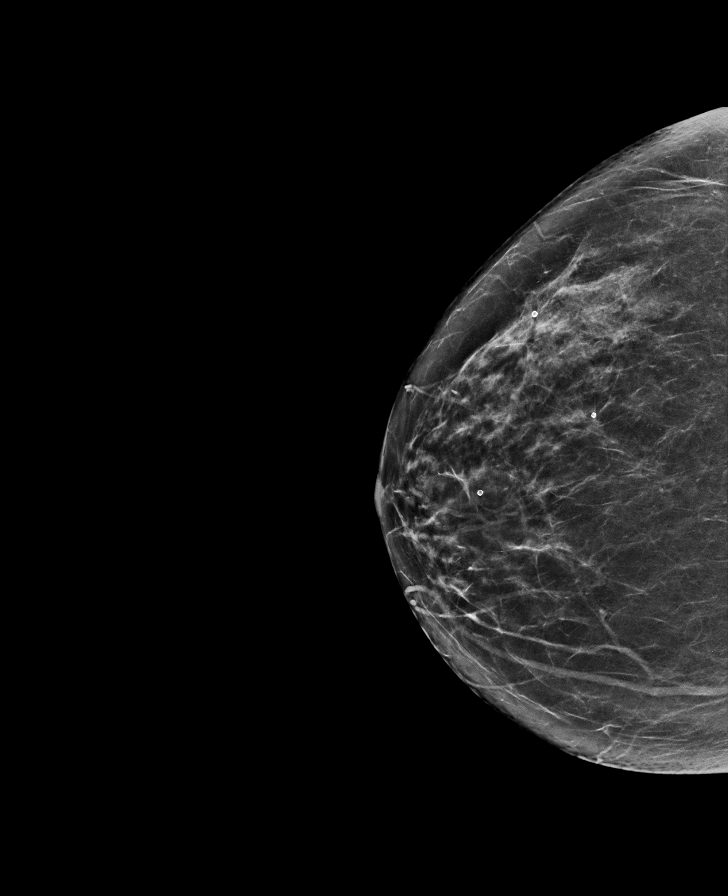

[L CC synth-2D]
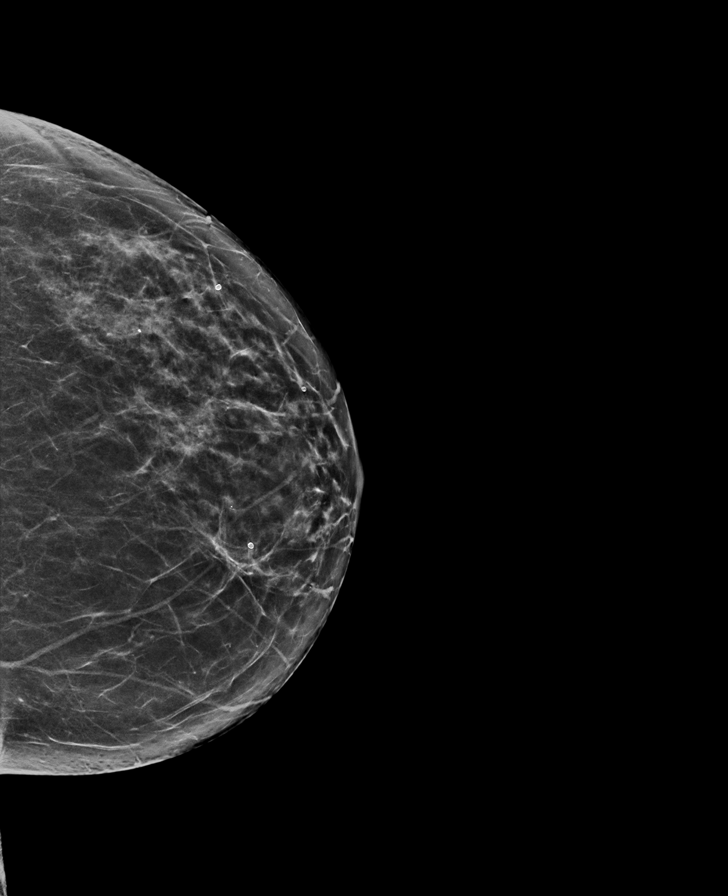

[R MLO tomo · tomo slice 37/73.0]
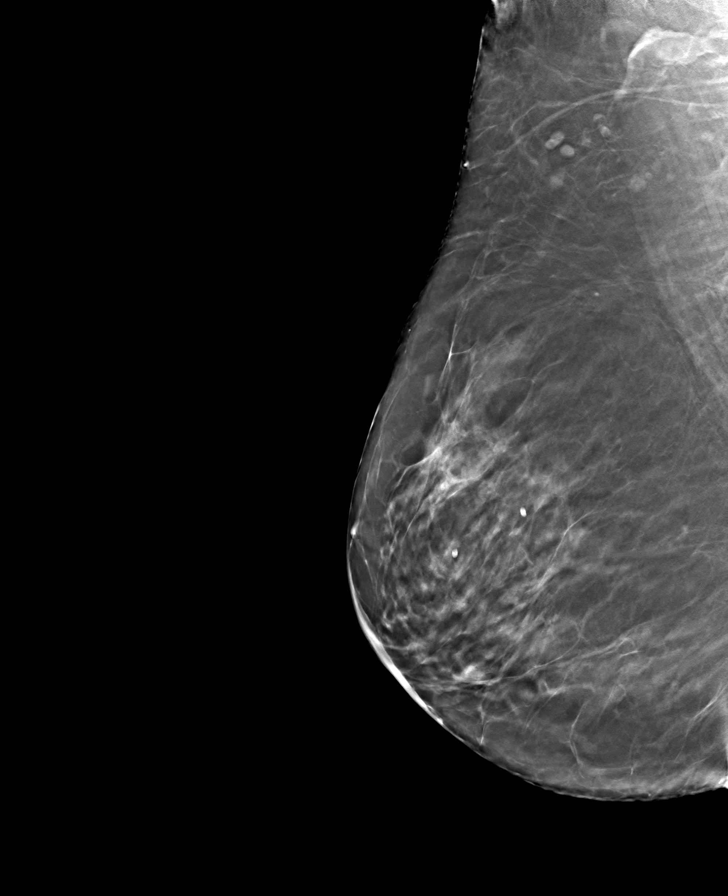

[L CC tomo · tomo slice 33/66.0]
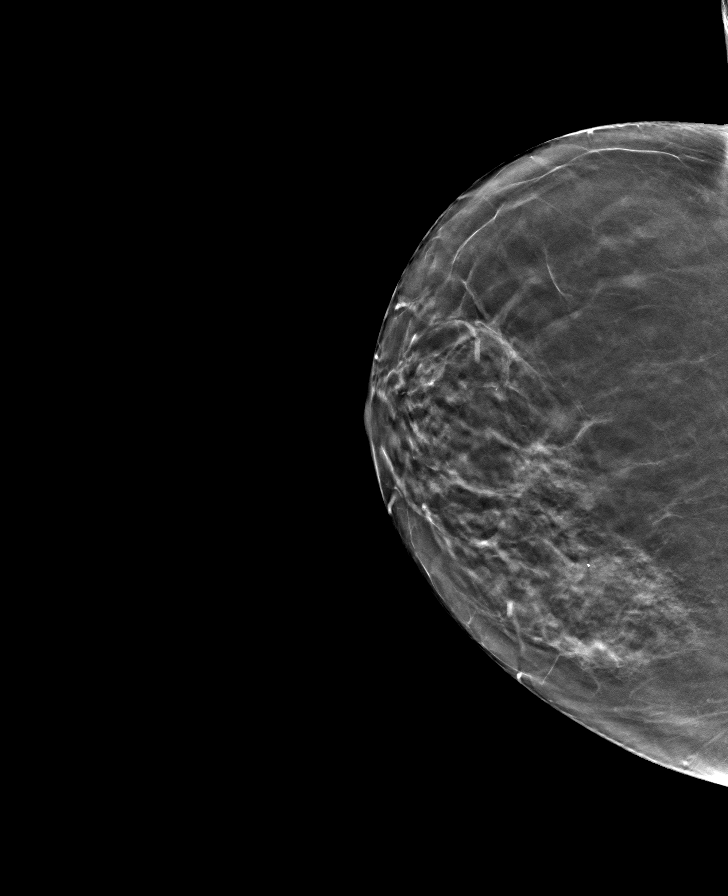

[L MLO tomo · tomo slice 37/72.0]
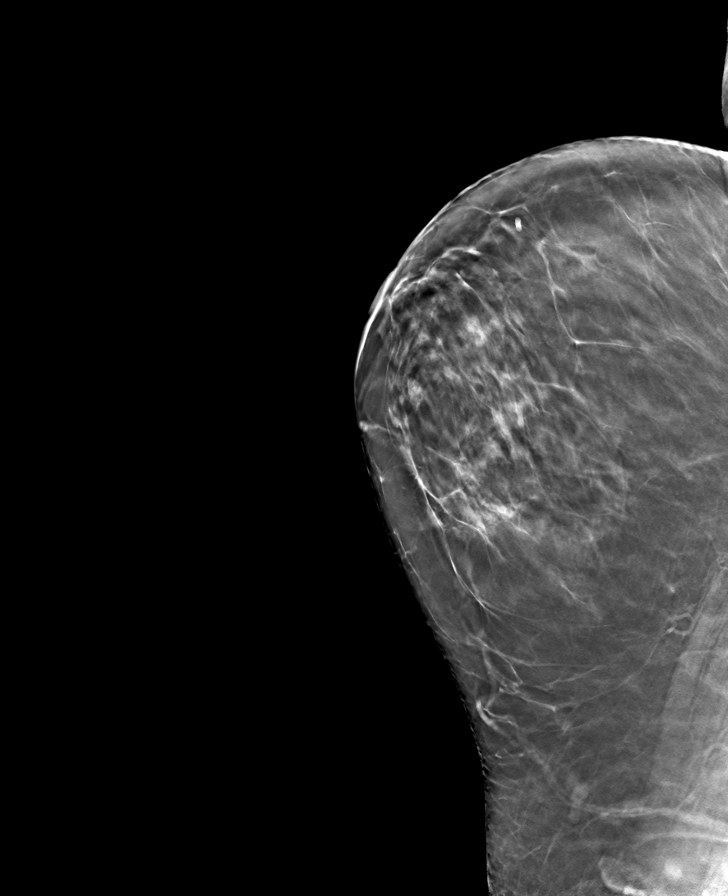

[R CC tomo · tomo slice 34/67.0]
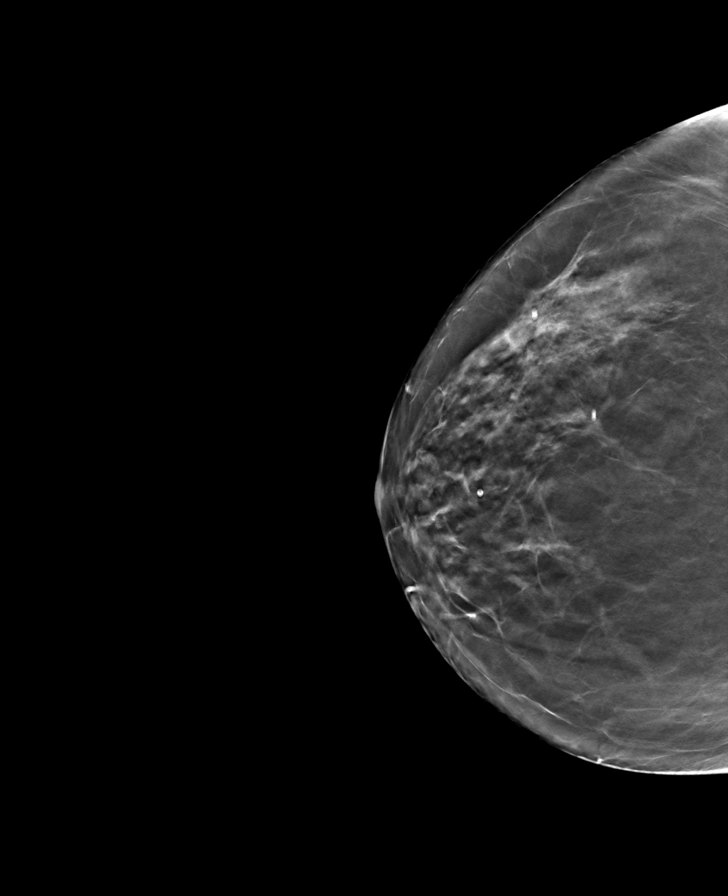

[8 of 24 positions shown; findings below may reference images not displayed]

ACR Breast Density Category b: There are scattered areas of
fibroglandular density.
FINDINGS: There are no findings suspicious for malignancy. The images were
evaluated with computer-aided detection.
IMPRESSION: No mammographic evidence of malignancy. A result letter of this
screening mammogram will be mailed directly to the patient.

RECOMMENDATION:
Screening mammogram in one year. (Code:[OD])

BI-RADS CATEGORY  1: Negative.

## 2021-08-10 DIAGNOSIS — Z961 Presence of intraocular lens: Secondary | ICD-10-CM | POA: Diagnosis not present

## 2021-08-13 DIAGNOSIS — R2232 Localized swelling, mass and lump, left upper limb: Secondary | ICD-10-CM | POA: Diagnosis not present

## 2021-08-13 DIAGNOSIS — M25571 Pain in right ankle and joints of right foot: Secondary | ICD-10-CM | POA: Diagnosis not present

## 2021-08-13 DIAGNOSIS — M25572 Pain in left ankle and joints of left foot: Secondary | ICD-10-CM | POA: Diagnosis not present

## 2021-08-13 DIAGNOSIS — M19079 Primary osteoarthritis, unspecified ankle and foot: Secondary | ICD-10-CM | POA: Diagnosis not present

## 2021-08-24 ENCOUNTER — Other Ambulatory Visit (HOSPITAL_BASED_OUTPATIENT_CLINIC_OR_DEPARTMENT_OTHER): Payer: Self-pay | Admitting: Orthopaedic Surgery

## 2021-08-24 DIAGNOSIS — R2232 Localized swelling, mass and lump, left upper limb: Secondary | ICD-10-CM

## 2021-08-25 ENCOUNTER — Ambulatory Visit (HOSPITAL_BASED_OUTPATIENT_CLINIC_OR_DEPARTMENT_OTHER)
Admission: RE | Admit: 2021-08-25 | Discharge: 2021-08-25 | Disposition: A | Discharge status: Home / Self Care | Payer: Medicare Other | Source: Ambulatory Visit | Attending: Orthopaedic Surgery | Admitting: Orthopaedic Surgery

## 2021-08-25 ENCOUNTER — Other Ambulatory Visit (HOSPITAL_BASED_OUTPATIENT_CLINIC_OR_DEPARTMENT_OTHER): Payer: Self-pay | Admitting: Orthopaedic Surgery

## 2021-08-25 ENCOUNTER — Other Ambulatory Visit: Payer: Self-pay

## 2021-08-25 DIAGNOSIS — R2232 Localized swelling, mass and lump, left upper limb: Secondary | ICD-10-CM

## 2021-08-25 DIAGNOSIS — M189 Osteoarthritis of first carpometacarpal joint, unspecified: Secondary | ICD-10-CM | POA: Diagnosis not present

## 2021-08-25 IMAGING — MR MR [PERSON_NAME]*[PERSON_NAME]* W/O CM
6 series · 40 of 40 positions shown · non-contrast
Comparison: None.

CLINICAL DATA: Left thigh mass for 6 months. Pain when holding a
coffee cup or similar objects.

EXAM:
MRI OF THE LEFT FINGERS WITHOUT CONTRAST
TECHNIQUE: Multiplanar, multisequence MR imaging of the left thumb was
performed. No intravenous contrast was administered.

[Series 9: T1 · oblique · 4.0mm · 0.47mm/px · 10 of 27 slices shown]
[im 1/27]
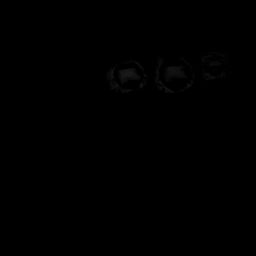
[im 3/27]
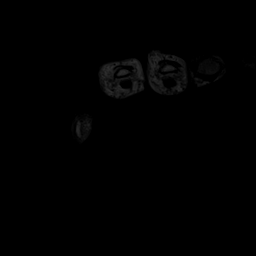
[im 6/27]
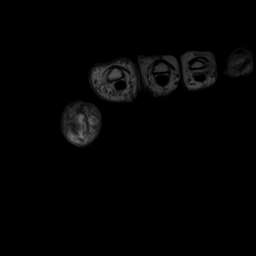
[im 9/27]
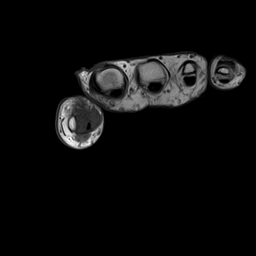
[im 12/27]
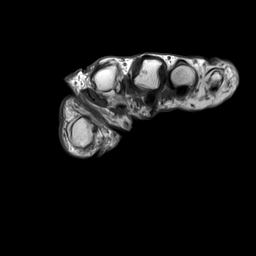
[im 15/27]
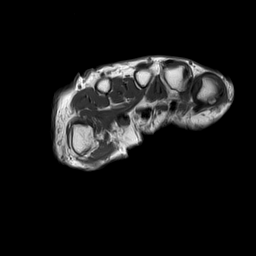
[im 18/27]
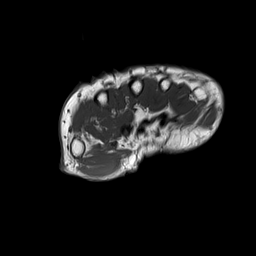
[im 21/27]
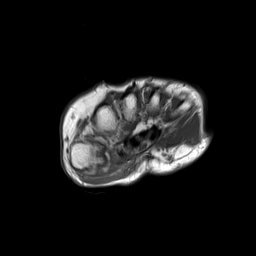
[im 24/27]
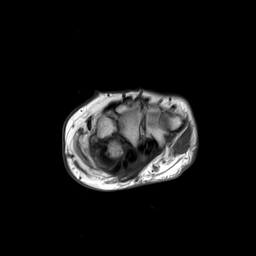
[im 27/27]
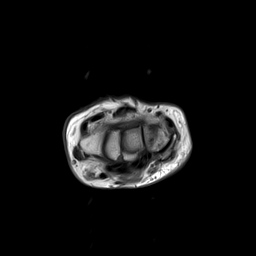

[Series 10: T2 fat-sat · oblique · 4.0mm · 0.47mm/px · 10 of 27 slices shown (1 of 2)]
[im 1/27]
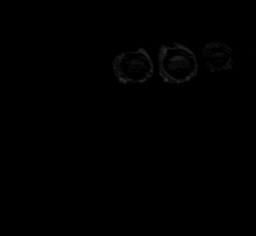
[im 3/27]
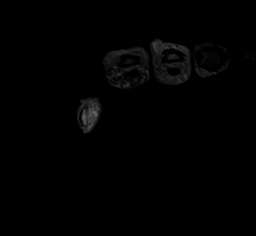
[im 6/27]
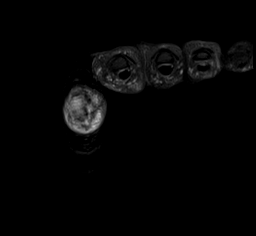
[im 9/27]
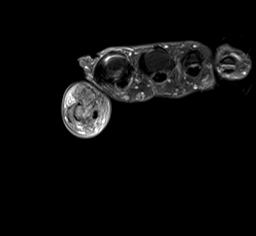
[im 12/27]
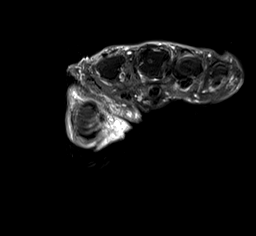
[im 15/27]
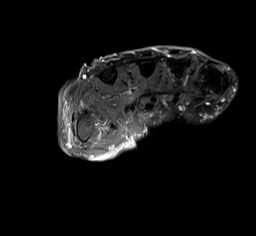
[im 18/27]
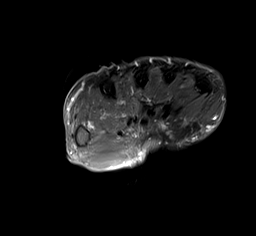
[im 21/27]
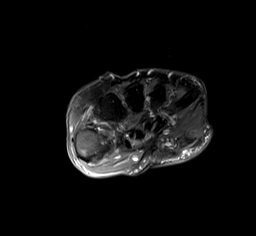
[im 24/27]
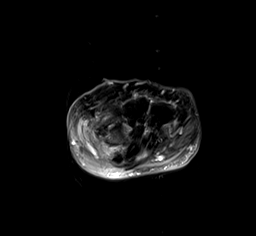
[im 27/27]
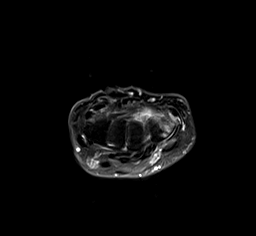

[Series 12: T2 fat-sat · sagittal · 2.0mm · 0.47mm/px · 6 of 16 slices shown (2 of 2)]
[im 1/16]
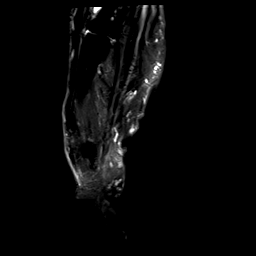
[im 4/16]
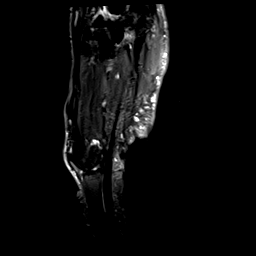
[im 7/16]
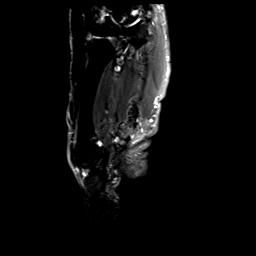
[im 10/16]
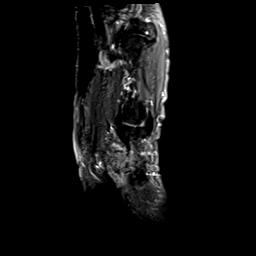
[im 13/16]
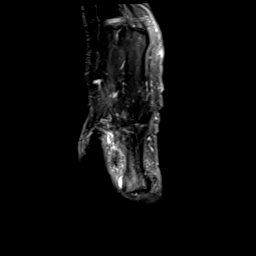
[im 16/16]
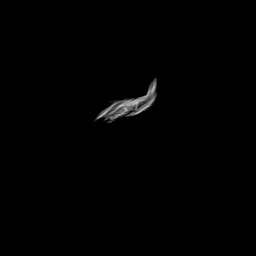

[Series 13: PD fat-sat · sagittal · 2.0mm · 0.55mm/px · 6 of 16 slices shown (1 of 2)]
[im 1/16]
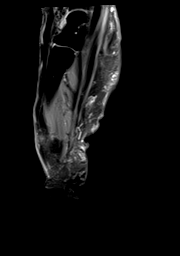
[im 4/16]
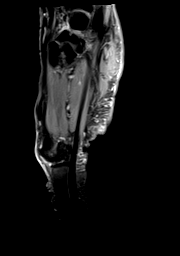
[im 7/16]
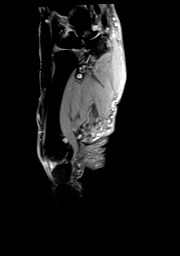
[im 10/16]
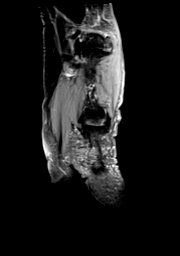
[im 13/16]
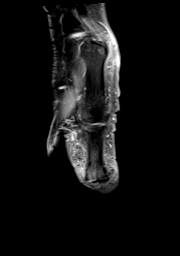
[im 16/16]
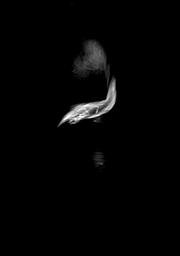

[Series 14: PD fat-sat · coronal · 2.0mm · 0.55mm/px · 7 of 17 slices shown (2 of 2)]
[im 1/17]
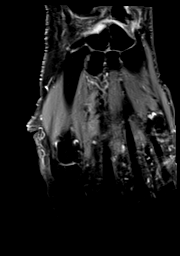
[im 3/17]
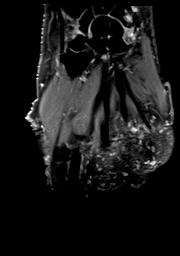
[im 6/17]
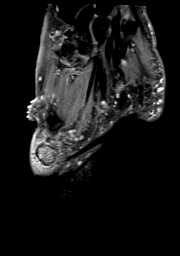
[im 9/17]
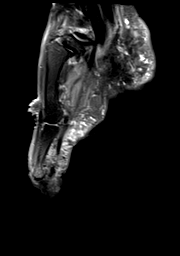
[im 11/17]
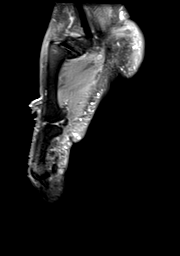
[im 14/17]
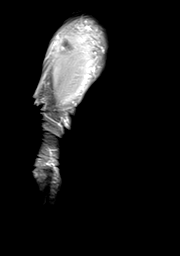
[im 17/17]
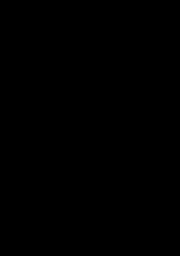

[Series 100: note · axial · 8.0mm · 0.59mm/px · 1 of 1 slices shown]
[im 1/1]
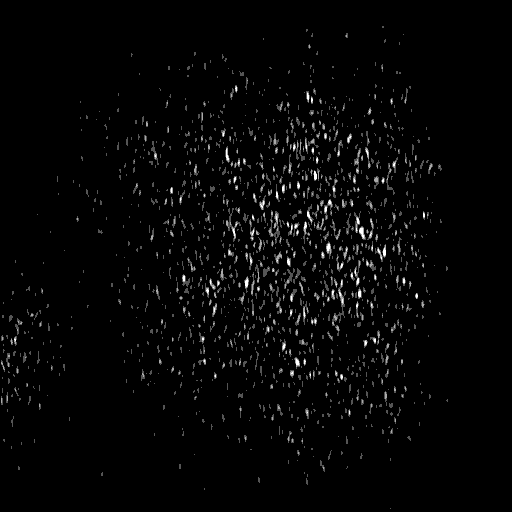

[40 of 40 positions shown; findings below may reference images not displayed]

FINDINGS: Patient motion degrades image quality limiting evaluation.

Bones/Joint/Cartilage

No fracture or dislocation. Normal alignment. No joint effusion.
Severe osteoarthritis of the first IP joint. Severe osteoarthrosis
of the first CMC joint and triscaphe joint.

1.3 x 1.4 x 1.6 cm soft tissue mass encasing the flexor pollicis
tendon and extending along the ulnar aspect at the level of the head
of the first proximal phalanx.

Ligaments

Collateral ligaments are intact.

Muscles and Tendons

Flexor and extensor compartment tendons are intact. Muscles are
normal.

Soft tissue
No fluid collection or hematoma.  No soft tissue mass.
IMPRESSION: 1. Indeterminate 1.3 x 1.4 x 1.6 cm soft tissue mass encasing the
flexor pollicis tendon and extending along the ulnar aspect at the
level of the head of the first proximal phalanx. Differential
considerations include giant cell of the tendon sheath versus a
fibroma of the tendon sheath. Tissue diagnosis is recommended.
2. Severe osteoarthritis of the first IP joint.
3. Severe osteoarthrosis of the first CMC joint and triscaphe joint.

## 2021-09-11 DIAGNOSIS — R2232 Localized swelling, mass and lump, left upper limb: Secondary | ICD-10-CM | POA: Diagnosis not present

## 2021-09-21 DIAGNOSIS — D481 Neoplasm of uncertain behavior of connective and other soft tissue: Secondary | ICD-10-CM | POA: Diagnosis not present

## 2021-09-21 DIAGNOSIS — M71342 Other bursal cyst, left hand: Secondary | ICD-10-CM | POA: Diagnosis not present

## 2021-09-21 DIAGNOSIS — R2232 Localized swelling, mass and lump, left upper limb: Secondary | ICD-10-CM | POA: Diagnosis not present

## 2021-09-26 ENCOUNTER — Encounter (HOSPITAL_COMMUNITY): Payer: Self-pay | Admitting: Radiology

## 2022-02-05 DIAGNOSIS — R2681 Unsteadiness on feet: Secondary | ICD-10-CM | POA: Diagnosis not present

## 2022-02-05 DIAGNOSIS — M810 Age-related osteoporosis without current pathological fracture: Secondary | ICD-10-CM | POA: Diagnosis not present

## 2022-02-05 DIAGNOSIS — E663 Overweight: Secondary | ICD-10-CM | POA: Diagnosis not present

## 2022-02-05 DIAGNOSIS — Z6827 Body mass index (BMI) 27.0-27.9, adult: Secondary | ICD-10-CM | POA: Diagnosis not present

## 2022-02-05 DIAGNOSIS — Z0000 Encounter for general adult medical examination without abnormal findings: Secondary | ICD-10-CM | POA: Diagnosis not present

## 2022-02-14 DIAGNOSIS — Z0001 Encounter for general adult medical examination with abnormal findings: Secondary | ICD-10-CM | POA: Diagnosis not present

## 2022-02-14 DIAGNOSIS — M7062 Trochanteric bursitis, left hip: Secondary | ICD-10-CM | POA: Diagnosis not present

## 2022-02-14 DIAGNOSIS — E559 Vitamin D deficiency, unspecified: Secondary | ICD-10-CM | POA: Diagnosis not present

## 2022-02-14 DIAGNOSIS — Z79899 Other long term (current) drug therapy: Secondary | ICD-10-CM | POA: Diagnosis not present

## 2022-02-14 DIAGNOSIS — Z1159 Encounter for screening for other viral diseases: Secondary | ICD-10-CM | POA: Diagnosis not present

## 2022-02-14 DIAGNOSIS — E663 Overweight: Secondary | ICD-10-CM | POA: Diagnosis not present

## 2022-02-14 DIAGNOSIS — R2681 Unsteadiness on feet: Secondary | ICD-10-CM | POA: Diagnosis not present

## 2022-02-14 DIAGNOSIS — M810 Age-related osteoporosis without current pathological fracture: Secondary | ICD-10-CM | POA: Diagnosis not present

## 2022-02-14 DIAGNOSIS — M7061 Trochanteric bursitis, right hip: Secondary | ICD-10-CM | POA: Diagnosis not present

## 2022-02-15 ENCOUNTER — Encounter (HOSPITAL_COMMUNITY): Payer: Self-pay

## 2022-02-15 ENCOUNTER — Emergency Department (HOSPITAL_COMMUNITY): Payer: Medicare PPO

## 2022-02-15 ENCOUNTER — Emergency Department (HOSPITAL_COMMUNITY)
Admission: EM | Admit: 2022-02-15 | Discharge: 2022-02-15 | Disposition: A | Discharge status: Home / Self Care | Payer: Medicare PPO | Attending: Emergency Medicine | Admitting: Emergency Medicine

## 2022-02-15 ENCOUNTER — Other Ambulatory Visit: Payer: Self-pay

## 2022-02-15 DIAGNOSIS — I10 Essential (primary) hypertension: Secondary | ICD-10-CM | POA: Diagnosis not present

## 2022-02-15 DIAGNOSIS — I6782 Cerebral ischemia: Secondary | ICD-10-CM | POA: Diagnosis not present

## 2022-02-15 DIAGNOSIS — R42 Dizziness and giddiness: Secondary | ICD-10-CM | POA: Diagnosis not present

## 2022-02-15 HISTORY — DX: Age-related osteoporosis without current pathological fracture: M81.0

## 2022-02-15 HISTORY — DX: Pure hypercholesterolemia, unspecified: E78.00

## 2022-02-15 LAB — CBC WITH DIFFERENTIAL/PLATELET
Abs Immature Granulocytes: 0.02 10*3/uL (ref 0.00–0.07)
Basophils Absolute: 0 10*3/uL (ref 0.0–0.1)
Basophils Relative: 0 %
Eosinophils Absolute: 0.1 10*3/uL (ref 0.0–0.5)
Eosinophils Relative: 2 %
HCT: 40 % (ref 36.0–46.0)
Hemoglobin: 13.4 g/dL (ref 12.0–15.0)
Immature Granulocytes: 0 %
Lymphocytes Relative: 33 %
Lymphs Abs: 1.9 10*3/uL (ref 0.7–4.0)
MCH: 31.5 pg (ref 26.0–34.0)
MCHC: 33.5 g/dL (ref 30.0–36.0)
MCV: 94.1 fL (ref 80.0–100.0)
Monocytes Absolute: 0.5 10*3/uL (ref 0.1–1.0)
Monocytes Relative: 9 %
Neutro Abs: 3.3 10*3/uL (ref 1.7–7.7)
Neutrophils Relative %: 56 %
Platelets: 254 10*3/uL (ref 150–400)
RBC: 4.25 MIL/uL (ref 3.87–5.11)
RDW: 13.6 % (ref 11.5–15.5)
WBC: 5.9 10*3/uL (ref 4.0–10.5)
nRBC: 0 % (ref 0.0–0.2)

## 2022-02-15 LAB — BASIC METABOLIC PANEL
Anion gap: 7 (ref 5–15)
BUN: 23 mg/dL (ref 8–23)
CO2: 26 mmol/L (ref 22–32)
Calcium: 8.8 mg/dL — ABNORMAL LOW (ref 8.9–10.3)
Chloride: 101 mmol/L (ref 98–111)
Creatinine, Ser: 0.81 mg/dL (ref 0.44–1.00)
GFR, Estimated: >60 mL/min (ref >60)
Glucose, Bld: 111 mg/dL — ABNORMAL HIGH (ref 70–99)
Potassium: 4 mmol/L (ref 3.5–5.1)
Sodium: 134 mmol/L — ABNORMAL LOW (ref 135–145)

## 2022-02-15 IMAGING — MR MR HEAD W/O CM
10 series · 48 of 48 positions shown · non-contrast
Comparison: Head CT [DATE]

CLINICAL DATA: Neuro deficit, acute, stroke suspected.  Dizziness.

EXAM:
MRI HEAD WITHOUT CONTRAST
TECHNIQUE: Multiplanar, multiecho pulse sequences of the brain and surrounding
structures were obtained without intravenous contrast.

[Series 5: DWI · axial · 3.0mm · 1.36mm/px · z∈[-70,+94]mm · 10 of 112 slices shown (1 of 2)]
[im 1/112]
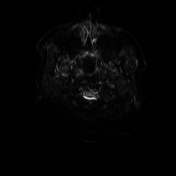
[im 13/112]
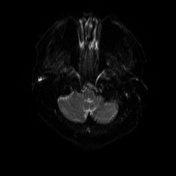
[im 25/112]
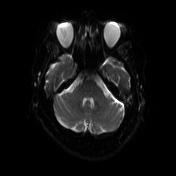
[im 38/112]
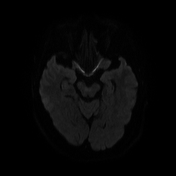
[im 50/112]
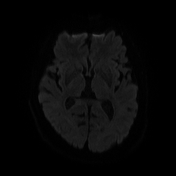
[im 62/112]
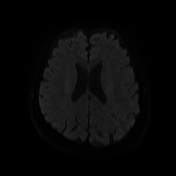
[im 75/112]
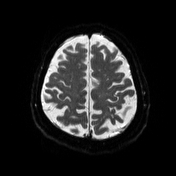
[im 87/112]
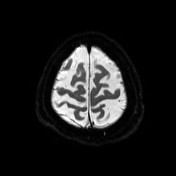
[im 99/112]
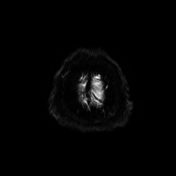
[im 112/112]
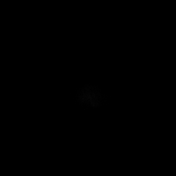

[Series 6: DWI · axial · 3.0mm · 1.36mm/px · z∈[-70,+94]mm · 5 of 56 slices shown (2 of 2)]
[im 1/56]
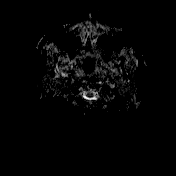
[im 14/56]
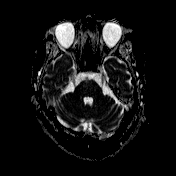
[im 28/56]
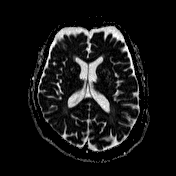
[im 42/56]
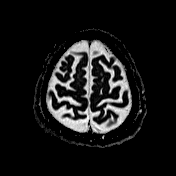
[im 56/56]
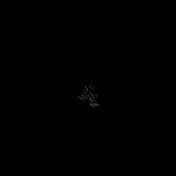

[Series 11: T1 · sagittal · 5.0mm · 0.75mm/px · 2 of 26 slices shown (1 of 2)]
[im 1/26]
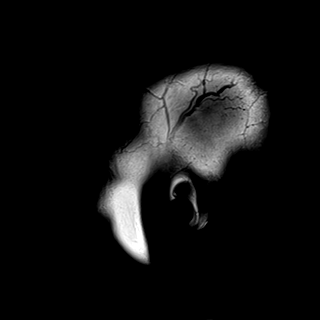
[im 26/26]
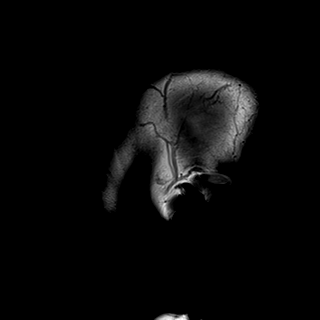

[Series 12: T2 · axial · 5.0mm · 0.62mm/px · z∈[-75,+93]mm · 2 of 27 slices shown (1 of 2)]
[im 1/27]
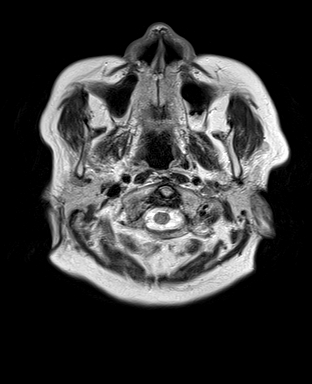
[im 27/27]
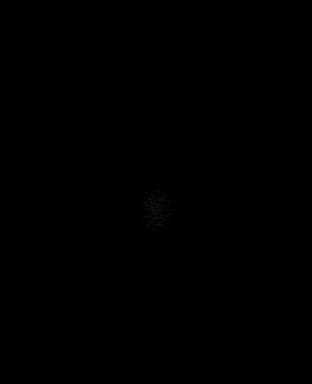

[Series 13: swi_images · axial · 3.0mm · 0.75mm/px · z∈[-73,+91]mm · 4 of 56 slices shown]
[im 1/56]
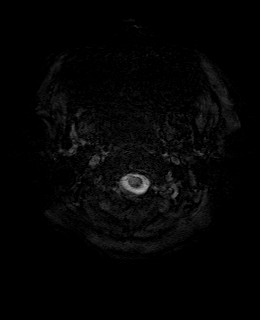
[im 19/56]
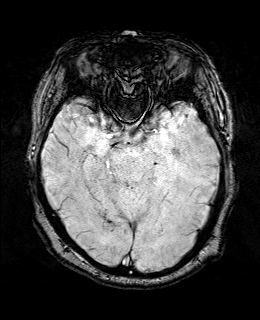
[im 37/56]
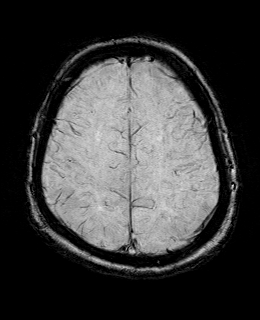
[im 56/56]
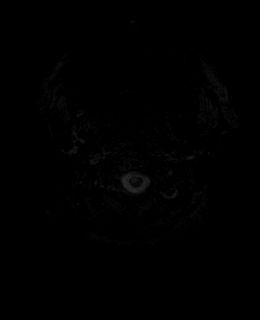

[Series 15: FLAIR · axial · 3.0mm · 0.75mm/px · z∈[-73,+91]mm · 4 of 56 slices shown]
[im 1/56]
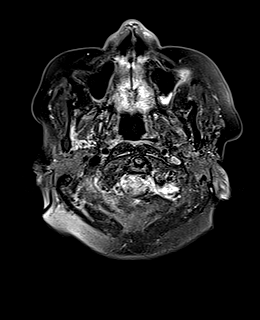
[im 19/56]
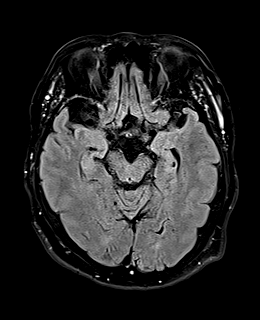
[im 37/56]
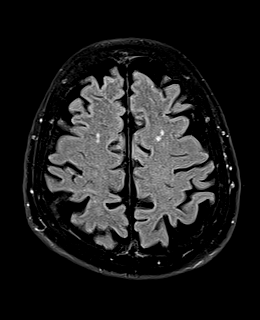
[im 56/56]
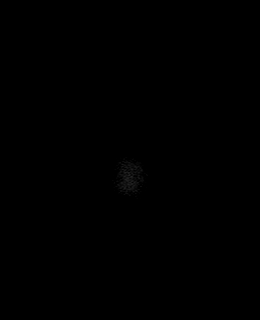

[Series 16: T1 · axial · 1.0mm · 0.94mm/px · z∈[-73,+86]mm · 12 of 160 slices shown (2 of 2)]
[im 1/160]
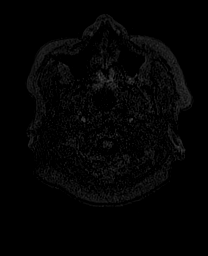
[im 15/160]
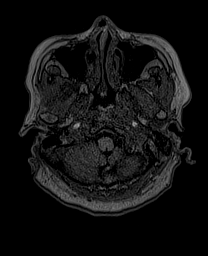
[im 29/160]
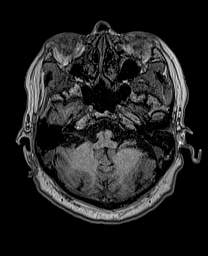
[im 44/160]
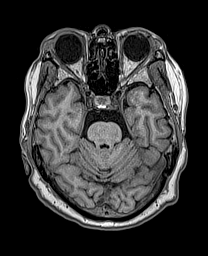
[im 58/160]
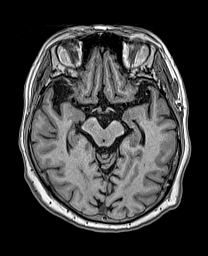
[im 73/160]
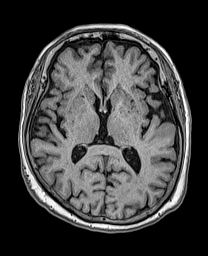
[im 87/160]
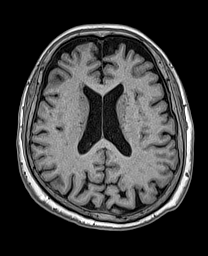
[im 102/160]
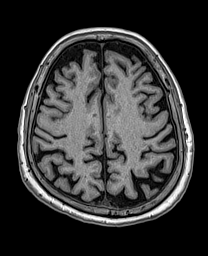
[im 116/160]
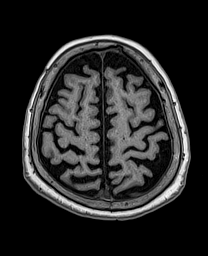
[im 131/160]
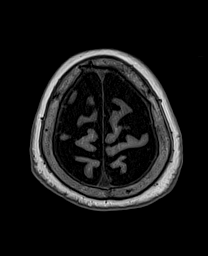
[im 145/160]
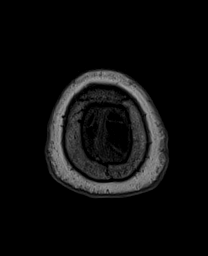
[im 160/160]
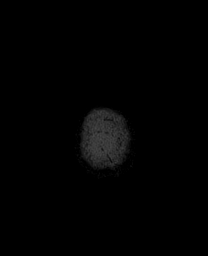

[Series 17: cor dwi_tracew · coronal · 5.0mm · 1.53mm/px · 4 of 58 slices shown]
[im 1/58]
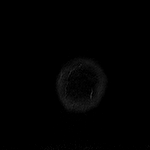
[im 20/58]
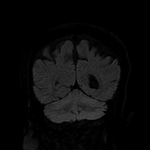
[im 39/58]
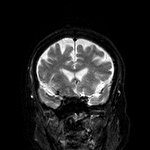
[im 58/58]
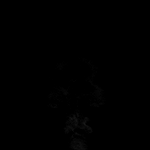

[Series 18: cor dwi_adc · coronal · 5.0mm · 1.53mm/px · 2 of 29 slices shown]
[im 1/29]
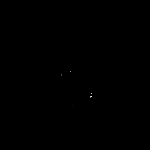
[im 29/29]
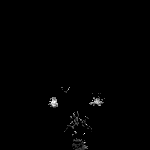

[Series 19: T2 · coronal · 5.0mm · 0.57mm/px · 3 of 39 slices shown (2 of 2)]
[im 1/39]
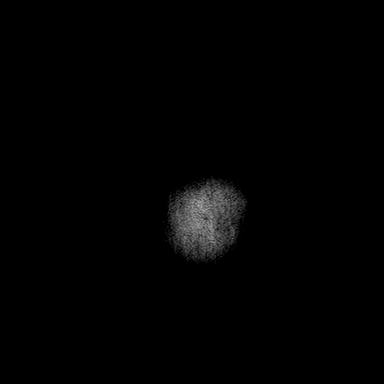
[im 20/39]
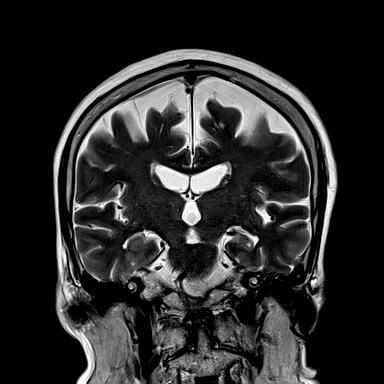
[im 39/39]
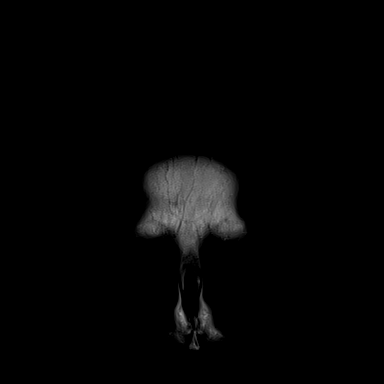

[48 of 48 positions shown; findings below may reference images not displayed]

FINDINGS: Brain: There is no evidence of an acute infarct, intracranial
hemorrhage, mass, midline shift, or extra-axial fluid collection.
Mild cerebral atrophy is within normal limits for age. Scattered
small T2 hyperintensities in the cerebral white matter bilaterally
are nonspecific but compatible with mild chronic small vessel
ischemic disease.

Vascular: Major intracranial vascular flow voids are preserved.

Skull and upper cervical spine: Unremarkable bone marrow signal para

Sinuses/Orbits: Bilateral cataract extraction. Clear paranasal
sinuses. Small left mastoid effusion.

Other: None.
IMPRESSION: 1. No acute intracranial abnormality.
2. Mild chronic small vessel ischemic disease.

## 2022-02-15 MED ORDER — MECLIZINE HCL 25 MG PO TABS: 25.0000 mg | ORAL_TABLET | Freq: Three times a day (TID) | ORAL | 0 refills | Status: AC | PRN

## 2022-02-15 MED ORDER — DIAZEPAM 5 MG/ML IJ SOLN
2.5000 mg | Freq: Once | INTRAMUSCULAR | Status: AC | PRN
Start: 2022-02-15 — End: 2022-02-15
  Administered 2022-02-15: 2.5 mg via INTRAVENOUS
  Filled 2022-02-15: qty 2

## 2022-02-15 MED ORDER — SODIUM CHLORIDE 0.9 % IV BOLUS
1000.0000 mL | Freq: Once | INTRAVENOUS | Status: AC
  Administered 2022-02-15: 1000 mL via INTRAVENOUS

## 2022-02-15 MED ORDER — PROCHLORPERAZINE EDISYLATE 10 MG/2ML IJ SOLN
5.0000 mg | Freq: Once | INTRAMUSCULAR | Status: AC
  Administered 2022-02-15: 5 mg via INTRAVENOUS
  Filled 2022-02-15: qty 2

## 2022-02-15 MED ORDER — DEXAMETHASONE 4 MG PO TABS
10.0000 mg | ORAL_TABLET | Freq: Once | ORAL | Status: AC
  Administered 2022-02-15: 10 mg via ORAL
  Filled 2022-02-15: qty 1

## 2022-02-15 MED ORDER — DIPHENHYDRAMINE HCL 50 MG/ML IJ SOLN
12.5000 mg | Freq: Once | INTRAMUSCULAR | Status: AC
  Administered 2022-02-15: 12.5 mg via INTRAVENOUS
  Filled 2022-02-15: qty 1

## 2022-02-15 MED ORDER — MECLIZINE HCL 25 MG PO TABS
25.0000 mg | ORAL_TABLET | Freq: Once | ORAL | Status: AC
  Administered 2022-02-15: 25 mg via ORAL
  Filled 2022-02-15: qty 1

## 2022-02-15 NOTE — ED Notes (Signed)
Patient transported to MRI 

## 2022-02-15 NOTE — ED Triage Notes (Signed)
Pt BIBA from home c/o sudden onset dizziness while cleaning out fridge. Worse with movement, less with eyes closed. No hx of vertigo, similar episode 40some years ago left with minimal hearing right ear. Also reports clogged ear feeling x2 weeks, denies illness.  ? ?154/84 ?HR 80 ?97% RA ?CBG 116 ?

## 2022-02-15 NOTE — Discharge Instructions (Addendum)
Follow up with your doctor in the office.  Take the meclizine as prescribed.  ?

## 2022-02-15 NOTE — ED Provider Notes (Signed)
?[facility] DEPT ?Provider Note ? ? ?CSN: [id number] ?Arrival date & time: 02/15/22  1230 ? ?  ? ?History ? ?Chief Complaint  ?Patient presents with  ? Dizziness  ? ? ?Robin Hutchinson is a 76 y.o. female. ? ?76 yo F with a chief complaints of dizziness.  This started while she was cleaning up the fridge.  She is very dizzy especially when she moves bends or twists.  She denies any head injury denies loss consciousness denies neck pain.  She denies chest pain or difficulty breathing.  Feels like she is been eating and drinking normally.  Has had some sinus congestion and a sensation like her ears are full for the past week or so. ? ? ?Dizziness ? ?  ? ?Home Medications ?Prior to Admission medications   ?Medication Sig Start Date End Date Taking? Authorizing Provider  ?acetaminophen (TYLENOL) 500 MG tablet Take 2,000-3,000 mg by mouth every 6 (six) hours as needed for mild pain.   Yes [provider]  ?alendronate (FOSAMAX) 70 MG tablet Take 70 mg by mouth every Sunday. Take with a full glass of water on an empty stomach.   Yes [provider]  ?atorvastatin (LIPITOR) 10 MG tablet Take 10 mg by mouth every evening.   Yes [provider]  ?Glucosamine HCl (GLUCOSAMINE PO) Take 2 tablets by mouth daily.   Yes [provider]  ?meclizine (ANTIVERT) 25 MG tablet Take 1 tablet (25 mg total) by mouth 3 (three) times daily as needed for dizziness. 02/15/22  Yes Deno Etienne, DO  ?meloxicam (MOBIC) 15 MG tablet Take 15 mg by mouth daily as needed for pain.   Yes [provider]  ?Multiple Vitamin (MULTIVITAMIN) tablet Take 1 tablet by mouth daily.   Yes [provider]  ?Propylene Glycol (SYSTANE COMPLETE OP) Place 1 drop into both eyes in the morning, at noon, and at bedtime.   Yes [provider]  ?VITAMIN D PO Take 1 tablet by mouth daily.   Yes [provider]  ?   ? ?Allergies    ?Aspirin, Oxycodone, and Sulfa antibiotics    ? ?Review of Systems   ?Review of Systems  ?Neurological:  Positive for dizziness.  ? ?Physical Exam ?Updated Vital Signs ?BP (!) 142/77   Pulse 75   Temp 98.9 ?F (37.2 ?C) (Oral)   Resp 16   Ht '5\' 2"'$  (1.575 m)   Wt 69.4 kg   SpO2 95%   BMI 27.98 kg/m?  ?Physical Exam ?Vitals and nursing note reviewed.  ?Constitutional:   ?   General: She is not in acute distress. ?   Appearance: She is well-developed. She is not diaphoretic.  ?HENT:  ?   Head: Normocephalic and atraumatic.  ?   Comments: Right TM is obscured by wax.  Left TM normal. ? ?Swollen turbinates and posterior nasal drip. ?Eyes:  ?   Pupils: Pupils are equal, round, and reactive to light.  ?Cardiovascular:  ?   Rate and Rhythm: Normal rate and regular rhythm.  ?   Heart sounds: No murmur heard. ?  No friction rub. No gallop.  ?Pulmonary:  ?   Effort: Pulmonary effort is normal.  ?   Breath sounds: No wheezing or rales.  ?Abdominal:  ?   General: There is no distension.  ?   Palpations: Abdomen is soft.  ?   Tenderness: There is no abdominal tenderness.  ?Musculoskeletal:     ?   General: No tenderness.  ?  Cervical back: Normal range of motion and neck supple.  ?Skin: ?   General: Skin is warm and dry.  ?Neurological:  ?   Mental Status: She is alert and oriented to person, place, and time.  ?   Cranial Nerves: Cranial nerves 2-12 are intact.  ?   Sensory: Sensation is intact.  ?   Motor: Motor function is intact.  ?   Coordination: Coordination is intact.  ?   Comments: No appreciable nystagmus on exam.  Finger-nose is intact.  Heel-to-shin also without waiver.  ?Psychiatric:     ?   Behavior: Behavior normal.  ? ? ?ED Results / Procedures / Treatments   ?Labs ?(all labs ordered are listed, but only abnormal results are displayed) ?Labs Reviewed  ?BASIC METABOLIC PANEL - Abnormal; Notable for the following components:  ?    Result Value  ? Sodium 134 (*)   ? Glucose, Bld 111 (*)   ? Calcium 8.8 (*)   ? All other components within normal limits   ?CBC WITH DIFFERENTIAL/PLATELET  ? ? ?EKG ?None ? ?Radiology ?No results found. ? ?Procedures ?Procedures  ? ? ?Medications Ordered in ED ?Medications  ?sodium chloride 0.9 % bolus 1,000 mL (0 mLs Intravenous Stopped 02/15/22 1446)  ?prochlorperazine (COMPAZINE) injection 5 mg (5 mg Intravenous Given 02/15/22 1339)  ?diphenhydrAMINE (BENADRYL) injection 12.5 mg (12.5 mg Intravenous Given 02/15/22 1339)  ?meclizine (ANTIVERT) tablet 25 mg (25 mg Oral Given 02/15/22 1344)  ?dexamethasone (DECADRON) tablet 10 mg (10 mg Oral Given 02/15/22 1344)  ?diazepam (VALIUM) injection 2.5 mg (2.5 mg Intravenous Given 02/15/22 1455)  ? ? ?ED Course/ Medical Decision Making/ A&P ?  ?                        ?Medical Decision Making ?Amount and/or Complexity of Data Reviewed ?Labs: ordered. ?Radiology: ordered. ? ?Risk ?Prescription drug management. ? ? ?77 yo F with a chief complaints of dizziness.  This was acute in onset occurred about 3 hours ago.  She has no obvious findings on neurologic exam.  We will attempt to treat her dizziness here.  Blood work bolus of IV fluids reassess. ? ?Patient is feeling a bit better but still not resolved.  We will obtain an MRI.  No anemia no leukocytosis no significant electrolyte abnormality. ? ?Signed out to Dr. Mariane Masters, please see his note for further details of care in the ED. ? ?The patients results and plan were reviewed and discussed.   ?Any x-rays performed were independently reviewed by myself.  ? ?Differential diagnosis were considered with the presenting HPI. ? ?Medications  ?sodium chloride 0.9 % bolus 1,000 mL (0 mLs Intravenous Stopped 02/15/22 1446)  ?prochlorperazine (COMPAZINE) injection 5 mg (5 mg Intravenous Given 02/15/22 1339)  ?diphenhydrAMINE (BENADRYL) injection 12.5 mg (12.5 mg Intravenous Given 02/15/22 1339)  ?meclizine (ANTIVERT) tablet 25 mg (25 mg Oral Given 02/15/22 1344)  ?dexamethasone (DECADRON) tablet 10 mg (10 mg Oral Given 02/15/22 1344)  ?diazepam (VALIUM) injection  2.5 mg (2.5 mg Intravenous Given 02/15/22 1455)  ? ? ?Vitals:  ? 02/15/22 1345 02/15/22 1415 02/15/22 1430 02/15/22 1455  ?BP:    (!) 142/77  ?Pulse: 82 76 79 75  ?Resp:    16  ?Temp:      ?TempSrc:      ?SpO2: 99% 99% 99% 95%  ?Weight:      ?Height:      ? ? ?Final diagnoses:  ?Dizziness  ? ? ? ? ? ? ? ? ? ?  Final Clinical Impression(s) / ED Diagnoses ?Final diagnoses:  ?Dizziness  ? ? ?Rx / DC Orders ?ED Discharge Orders   ? ?      Ordered  ?  meclizine (ANTIVERT) 25 MG tablet  3 times daily PRN       ? 02/15/22 1519  ? ?  ?  ? ?  ? ? ?  ?Deno Etienne, DO ?02/15/22 1519 ? ?

## 2022-02-21 ENCOUNTER — Other Ambulatory Visit: Payer: Self-pay | Admitting: Family Medicine

## 2022-02-21 DIAGNOSIS — E663 Overweight: Secondary | ICD-10-CM | POA: Diagnosis not present

## 2022-02-21 DIAGNOSIS — M7061 Trochanteric bursitis, right hip: Secondary | ICD-10-CM | POA: Diagnosis not present

## 2022-02-21 DIAGNOSIS — M19071 Primary osteoarthritis, right ankle and foot: Secondary | ICD-10-CM | POA: Diagnosis not present

## 2022-02-21 DIAGNOSIS — R7303 Prediabetes: Secondary | ICD-10-CM | POA: Diagnosis not present

## 2022-02-21 DIAGNOSIS — M810 Age-related osteoporosis without current pathological fracture: Secondary | ICD-10-CM

## 2022-02-21 DIAGNOSIS — R42 Dizziness and giddiness: Secondary | ICD-10-CM | POA: Diagnosis not present

## 2022-02-21 DIAGNOSIS — M7062 Trochanteric bursitis, left hip: Secondary | ICD-10-CM | POA: Diagnosis not present

## 2022-02-21 DIAGNOSIS — Z1231 Encounter for screening mammogram for malignant neoplasm of breast: Secondary | ICD-10-CM

## 2022-02-21 DIAGNOSIS — Z6827 Body mass index (BMI) 27.0-27.9, adult: Secondary | ICD-10-CM | POA: Diagnosis not present

## 2022-03-05 ENCOUNTER — Ambulatory Visit (HOSPITAL_BASED_OUTPATIENT_CLINIC_OR_DEPARTMENT_OTHER)
Admission: RE | Admit: 2022-03-05 | Discharge: 2022-03-05 | Disposition: A | Discharge status: Home / Self Care | Payer: Medicare PPO | Source: Ambulatory Visit | Attending: Family Medicine | Admitting: Family Medicine

## 2022-03-05 ENCOUNTER — Encounter (HOSPITAL_BASED_OUTPATIENT_CLINIC_OR_DEPARTMENT_OTHER): Payer: Self-pay

## 2022-03-05 DIAGNOSIS — M810 Age-related osteoporosis without current pathological fracture: Secondary | ICD-10-CM | POA: Insufficient documentation

## 2022-03-05 DIAGNOSIS — Z1231 Encounter for screening mammogram for malignant neoplasm of breast: Secondary | ICD-10-CM | POA: Diagnosis present

## 2022-03-05 IMAGING — MG MM DIGITAL SCREENING BILAT W/ TOMO AND CAD
6 of 10 series · 6 of 30 positions shown · non-contrast
Comparison: Previous exam(s).

CLINICAL DATA: Screening.

EXAM:
DIGITAL SCREENING BILATERAL MAMMOGRAM WITH TOMOSYNTHESIS AND CAD
TECHNIQUE: Bilateral screening digital craniocaudal and mediolateral oblique
mammograms were obtained. Bilateral screening digital breast
tomosynthesis was performed. The images were evaluated with
computer-aided detection.

[R MLO synth-2D]
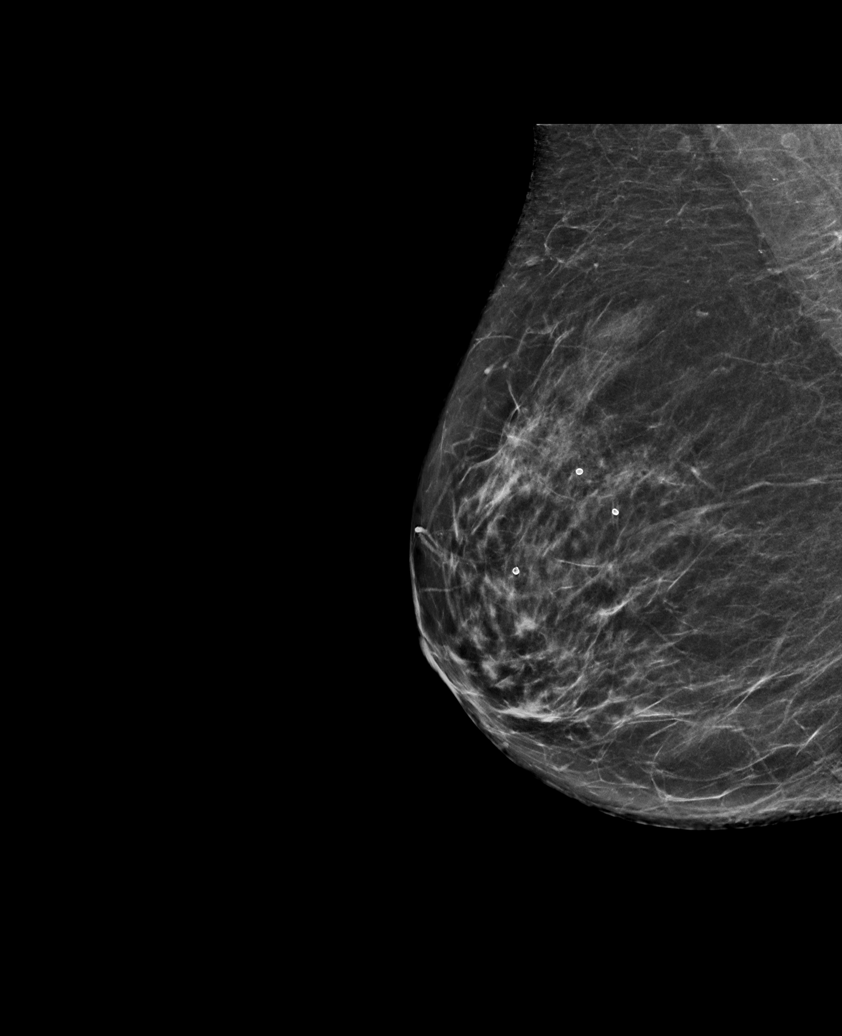

[L MLO synth-2D (1 of 2)]
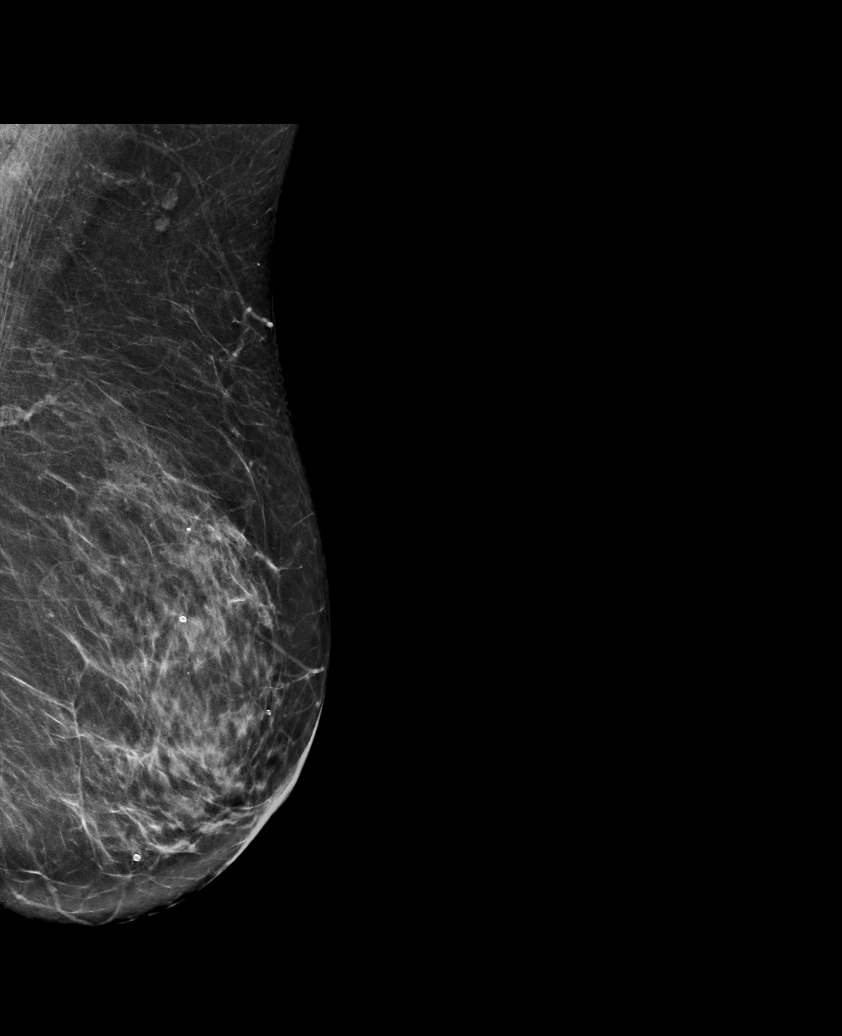

[L MLO synth-2D (2 of 2)]
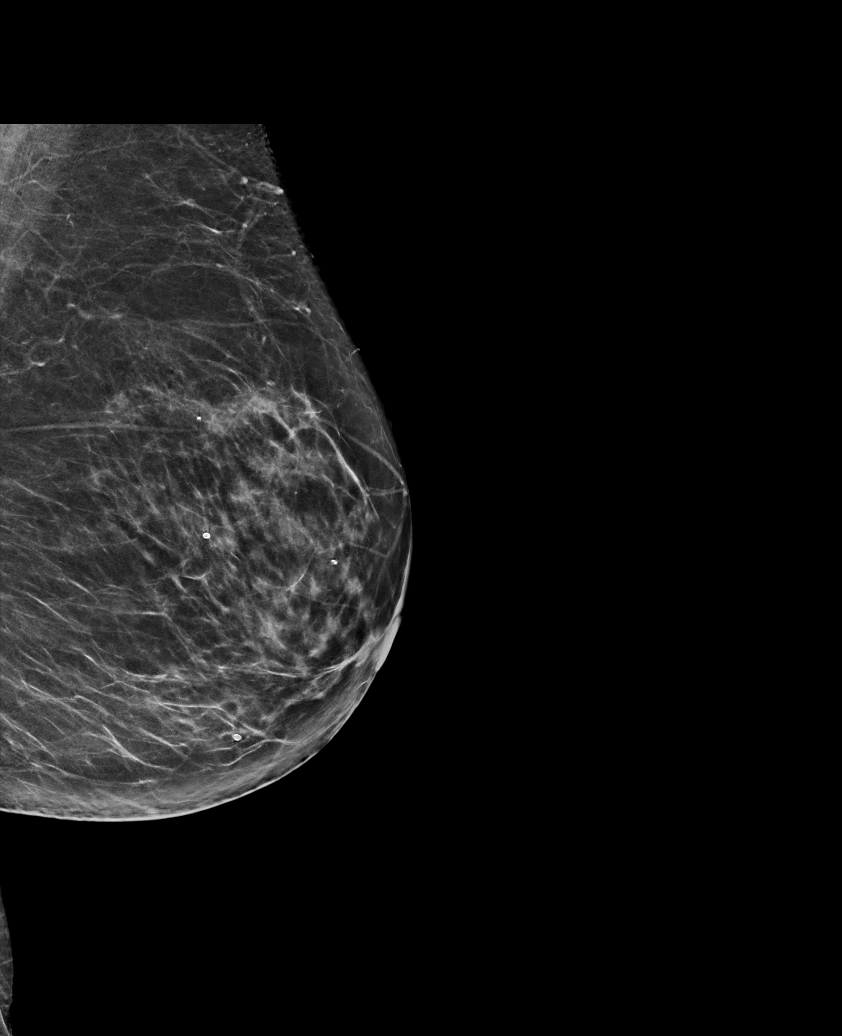

[R CC synth-2D]
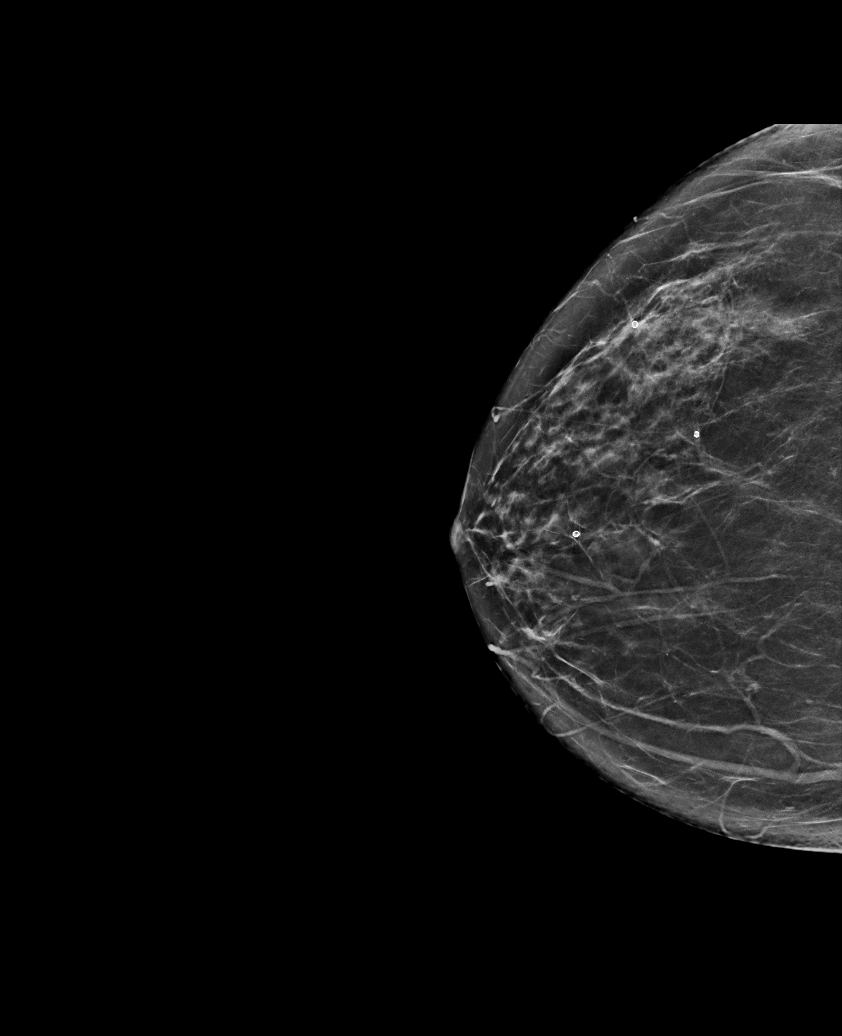

[L CC synth-2D]
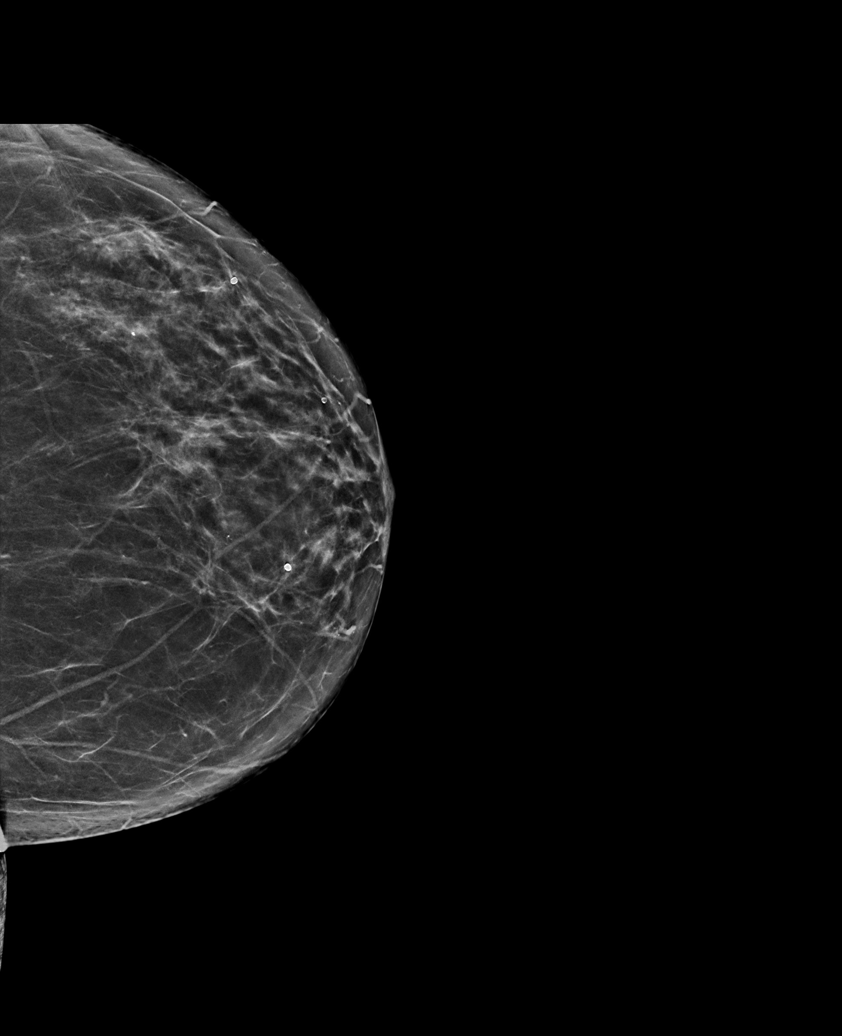

[L CC tomo · tomo slice 31/61.0]
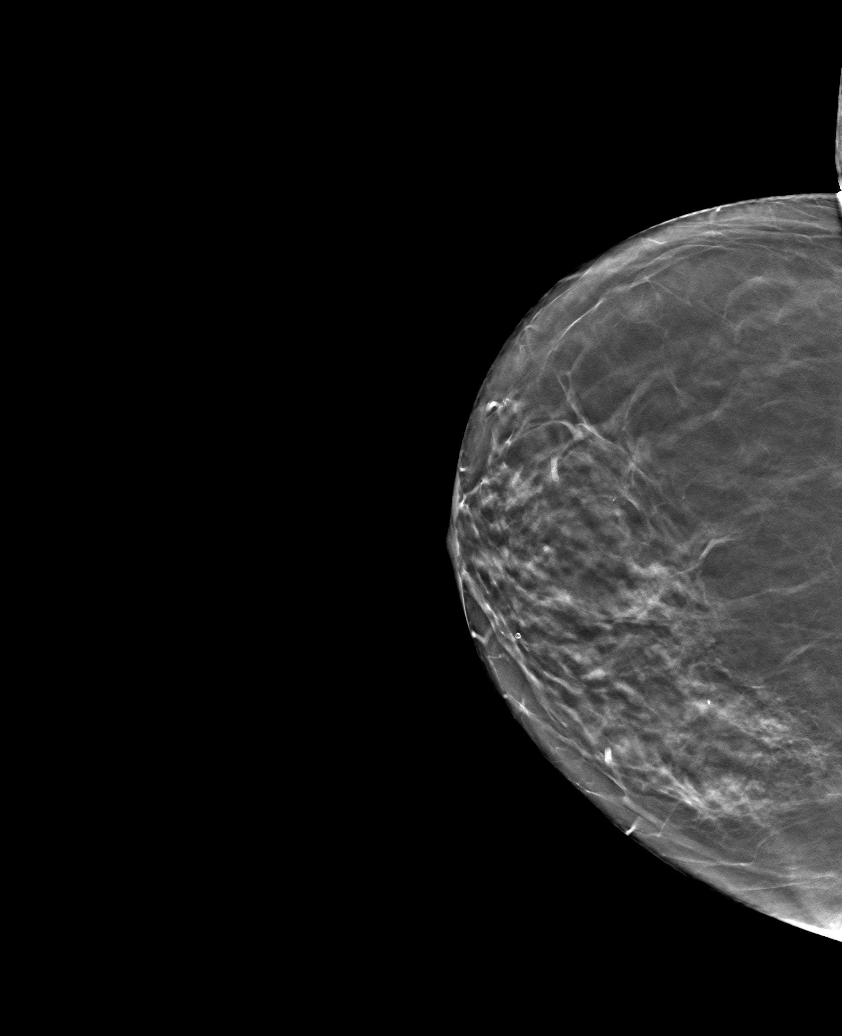

[6 of 30 positions shown; findings below may reference images not displayed]

ACR Breast Density Category c: The breast tissue is heterogeneously
dense, which may obscure small masses.
FINDINGS: There are no findings suspicious for malignancy.
IMPRESSION: No mammographic evidence of malignancy. A result letter of this
screening mammogram will be mailed directly to the patient.

RECOMMENDATION:
Screening mammogram in one year. (Code:[V2])

BI-RADS CATEGORY  1: Negative.

## 2022-09-10 ENCOUNTER — Ambulatory Visit: Payer: Medicare PPO | Attending: Chiropractor | Admitting: Physical Therapy

## 2022-09-10 ENCOUNTER — Encounter: Payer: Self-pay | Admitting: Physical Therapy

## 2022-09-10 DIAGNOSIS — M5416 Radiculopathy, lumbar region: Secondary | ICD-10-CM | POA: Diagnosis present

## 2022-09-10 DIAGNOSIS — M62830 Muscle spasm of back: Secondary | ICD-10-CM | POA: Diagnosis present

## 2022-09-10 DIAGNOSIS — M5459 Other low back pain: Secondary | ICD-10-CM

## 2022-09-10 NOTE — Therapy (Signed)
OUTPATIENT PHYSICAL THERAPY THORACOLUMBAR EVALUATION   Patient Name: Robin Hutchinson MRN: [id number] DOB:January 05, 1946, 76 y.o., female Today's Date: 09/10/2022   PT End of Session - 09/10/22 0804     Visit Number 1    Number of Visits 13    Date for PT Re-Evaluation 11/10/22    Authorization Type [employer]    PT Start Time 0800    PT Stop Time 0845    PT Time Calculation (min) 45 min    Activity Tolerance Patient tolerated treatment well    Behavior During Therapy WFL for tasks assessed/performed             Past Medical History:  Diagnosis Date   Arthritis    Hypercholesterolemia    Osteoporosis    Past Surgical History:  Procedure Laterality Date   CESAREAN SECTION     TOTAL KNEE ARTHROPLASTY Right 01/20/2019   Procedure: TOTAL KNEE ARTHROPLASTY;  Surgeon: Dorna Leitz, MD;  Location: [facility];  Service: Orthopedics;  Laterality: Right;   Patient Active Problem List   Diagnosis Date Noted   Primary osteoarthritis of right knee 01/20/2019    PCP: Kathyrn Lass  REFERRING PROVIDER: Lucius Conn  REFERRING DIAG: LBP, sciatica  Rationale for Evaluation and Treatment Rehabilitation  THERAPY DIAG:  Other low back pain  Muscle spasm of back  ONSET DATE: August 2023  SUBJECTIVE:                                                                                                                                                                                           SUBJECTIVE STATEMENT: Reports a few weeks ago she woke up with pain in the low back and the right buttock, reports recent x-ray showed scoliosis, reports that she has continued to have pain PERTINENT HISTORY:  above  PAIN:  Are you having pain? Yes: NPRS scale: 4/10 Pain location: low back and bilateral buttocks Pain description: tight mm, pulling Aggravating factors: standing, bending twisitng pain up to 8/10 Relieving factors: Tylenol, meloxicam   PRECAUTIONS: None  WEIGHT BEARING RESTRICTIONS  No  FALLS:  Has patient fallen in last 6 months? No  LIVING ENVIRONMENT: Lives with: lives with their family Lives in: House/apartment Stairs: No Has following equipment at home: None  OCCUPATION: retired  PLOF: Independent and cook and clean  PATIENT GOALS less pain, walk and travel again   OBJECTIVE:   DIAGNOSTIC FINDINGS:  Xrays show scoliosis  PATIENT SURVEYS:  FOTO 50  COGNITION:  Overall cognitive status: Within functional limits for tasks assessed     SENSATION: WFL  MUSCLE LENGTH: Hamstrings: Right 50 deg; Left 50 deg  POSTURE:  rounded shoulders and forward head  PALPATION: She is tight in the lumbar and buttocks, tender in the buttocks  LUMBAR ROM:   Active  A/PROM  eval  Flexion Decreased 50%  Extension 50%  Right lateral flexion 50%  Left lateral flexion 50%  Right rotation   Left rotation    (Blank rows = not tested)  LOWER EXTREMITY ROM:     Active  Right eval Left eval  Hip flexion    Hip extension    Hip abduction    Hip adduction    Hip internal rotation    Hip external rotation    Knee flexion 100 100  Knee extension 10 10  Ankle dorsiflexion    Ankle plantarflexion    Ankle inversion    Ankle eversion     (Blank rows = not tested)  LOWER EXTREMITY MMT:   4-/5 with some low back pain    FUNCTIONAL TESTS:  Timed up and go (TUG): 22  GAIT: Distance walked: 50 feet Assistive device utilized: None Level of assistance: Complete Independence Comments: mild antalgic on the left    TODAY'S TREATMENT  09/10/22 MHP/IFC to the L/S area supine   PATIENT EDUCATION:  Education details: Flexion exercises and piriformis stretches Person educated: Patient Education method: Consulting civil engineer, Media planner, Verbal cues, and Handouts Education comprehension: verbalized understanding   HOME EXERCISE PROGRAM: See above  ASSESSMENT:  CLINICAL IMPRESSION: Patient is a 76 y.o. female who was seen today for physical therapy  evaluation and treatment for LBP and radiculopathy, she has a loss of knee extension, she is tight in the LE mms, tight in the lumbar and buttocks with spasms present.  She has significant lack of lumbar ROM.    OBJECTIVE IMPAIRMENTS Abnormal gait, decreased activity tolerance, decreased mobility, difficulty walking, decreased ROM, decreased strength, increased fascial restrictions, increased muscle spasms, impaired flexibility, improper body mechanics, postural dysfunction, and pain.   REHAB POTENTIAL: Good  CLINICAL DECISION MAKING: Stable/uncomplicated  EVALUATION COMPLEXITY: Low   GOALS: Goals reviewed with patient? Yes  SHORT TERM GOALS: Target date: 09/24/22  Independent with initial HEP Goal status: INITIAL  LONG TERM GOALS: Target date: 12/03/2022  Understand posture and body mechanics Goal status: INITIAL  2.  Decrease pain 50% Goal status: INITIAL  3.  Increase lumbar ROM 25% Goal status: INITIAL  4.  Walk 30 minutes for exercise Goal status: INITIAL  5.  Tolerate sitting >30 minutes Goal status: INITIAL  PLAN: PT FREQUENCY: 1-2x/week  PT DURATION: 12 weeks  PLANNED INTERVENTIONS: Therapeutic exercises, Therapeutic activity, Neuromuscular re-education, Balance training, Gait training, Patient/Family education, Self Care, Joint mobilization, Dry Needling, Electrical stimulation, Spinal mobilization, Cryotherapy, Moist heat, Traction, and Manual therapy.  PLAN FOR NEXT SESSION: start gym activities   Sumner Boast, PT 09/10/2022, 8:05 AM

## 2022-09-17 ENCOUNTER — Ambulatory Visit: Payer: Medicare PPO | Admitting: Physical Therapy

## 2022-09-17 ENCOUNTER — Encounter: Payer: Self-pay | Admitting: Physical Therapy

## 2022-09-17 DIAGNOSIS — M5459 Other low back pain: Secondary | ICD-10-CM

## 2022-09-17 DIAGNOSIS — M62830 Muscle spasm of back: Secondary | ICD-10-CM

## 2022-09-17 DIAGNOSIS — M5416 Radiculopathy, lumbar region: Secondary | ICD-10-CM

## 2022-09-17 NOTE — Therapy (Signed)
OUTPATIENT PHYSICAL THERAPY THORACOLUMBAR TREATMENT  Patient Name: Robin Hutchinson MRN: [id number] DOB:Apr 05, 1946, 76 y.o., female Today's Date: 09/17/2022   PT End of Session - 09/17/22 1751     Visit Number 2    Number of Visits 13    Date for PT Re-Evaluation 11/10/22    Authorization Type [employer]    PT Start Time 8416    PT Stop Time 6063    PT Time Calculation (min) 45 min    Activity Tolerance Patient tolerated treatment well    Behavior During Therapy WFL for tasks assessed/performed             Past Medical History:  Diagnosis Date   Arthritis    Hypercholesterolemia    Osteoporosis    Past Surgical History:  Procedure Laterality Date   CESAREAN SECTION     TOTAL KNEE ARTHROPLASTY Right 01/20/2019   Procedure: TOTAL KNEE ARTHROPLASTY;  Surgeon: Dorna Leitz, MD;  Location: [facility];  Service: Orthopedics;  Laterality: Right;   Patient Active Problem List   Diagnosis Date Noted   Primary osteoarthritis of right knee 01/20/2019    PCP: Kathyrn Lass  REFERRING PROVIDER: Lucius Conn  REFERRING DIAG: LBP, sciatica  Rationale for Evaluation and Treatment Rehabilitation  THERAPY DIAG:  Other low back pain  Muscle spasm of back  Radiculopathy, lumbar region  ONSET DATE: August 2023  SUBJECTIVE:                                                                                                                                                                                           SUBJECTIVE STATEMENT: Patient reports that she had a cortisone injection in her knee this morning and is not supposed to do much with the leg PERTINENT HISTORY:  above  PAIN:  Are you having pain? Yes: NPRS scale: 4/10 Pain location: low back and bilateral buttocks Pain description: tight mm, pulling Aggravating factors: standing, bending twisitng pain up to 8/10 Relieving factors: Tylenol, meloxicam   PRECAUTIONS: None  WEIGHT BEARING RESTRICTIONS No  FALLS:  Has patient  fallen in last 6 months? No  LIVING ENVIRONMENT: Lives with: lives with their family Lives in: House/apartment Stairs: No Has following equipment at home: None  OCCUPATION: retired  PLOF: Independent and cook and clean  PATIENT GOALS less pain, walk and travel again   OBJECTIVE:   DIAGNOSTIC FINDINGS:  Xrays show scoliosis  PATIENT SURVEYS:  FOTO 28  COGNITION:  Overall cognitive status: Within functional limits for tasks assessed     SENSATION: WFL  MUSCLE LENGTH: Hamstrings: Right 50 deg; Left 50 deg  POSTURE: rounded shoulders and forward head  PALPATION: She is tight in the lumbar and buttocks, tender in the buttocks  LUMBAR ROM:   Active  A/PROM  eval  Flexion Decreased 50%  Extension 50%  Right lateral flexion 50%  Left lateral flexion 50%  Right rotation   Left rotation    (Blank rows = not tested)  LOWER EXTREMITY ROM:     Active  Right eval Left eval  Hip flexion    Hip extension    Hip abduction    Hip adduction    Hip internal rotation    Hip external rotation    Knee flexion 100 100  Knee extension 10 10  Ankle dorsiflexion    Ankle plantarflexion    Ankle inversion    Ankle eversion     (Blank rows = not tested)  LOWER EXTREMITY MMT:   4-/5 with some low back pain    FUNCTIONAL TESTS:  Timed up and go (TUG): 22  GAIT: Distance walked: 50 feet Assistive device utilized: None Level of assistance: Complete Independence Comments: mild antalgic on the left    TODAY'S TREATMENT  09/17/22 Pelvic tilts Feet on ball K2C, trunk rotation, small bridges and isometric abs SLR's supine with core activation 2x10 each LE stretches Hooklying green tband clamshells 2x10 MHP/IFC on the low back  09/10/22 MHP/IFC to the L/S area supine   PATIENT EDUCATION:  Education details: Flexion exercises and piriformis stretches Person educated: Patient Education method: Consulting civil engineer, Media planner, Verbal cues, and Handouts Education  comprehension: verbalized understanding   HOME EXERCISE PROGRAM: See above  ASSESSMENT:  CLINICAL IMPRESSION: Patient had a cortisone injection today and is not supposed to do much with the knee today, we did mostly supine activities and worked on core, she is weak in the his and the core  OBJECTIVE IMPAIRMENTS Abnormal gait, decreased activity tolerance, decreased mobility, difficulty walking, decreased ROM, decreased strength, increased fascial restrictions, increased muscle spasms, impaired flexibility, improper body mechanics, postural dysfunction, and pain.   REHAB POTENTIAL: Good  CLINICAL DECISION MAKING: Stable/uncomplicated  EVALUATION COMPLEXITY: Low   GOALS: Goals reviewed with patient? Yes  SHORT TERM GOALS: Target date: 09/24/22  Independent with initial HEP Goal status: INITIAL  LONG TERM GOALS: Target date: 12/10/2022  Understand posture and body mechanics Goal status: INITIAL  2.  Decrease pain 50% Goal status: INITIAL  3.  Increase lumbar ROM 25% Goal status: INITIAL  4.  Walk 30 minutes for exercise Goal status: INITIAL  5.  Tolerate sitting >30 minutes Goal status: INITIAL  PLAN: PT FREQUENCY: 1-2x/week  PT DURATION: 12 weeks  PLANNED INTERVENTIONS: Therapeutic exercises, Therapeutic activity, Neuromuscular re-education, Balance training, Gait training, Patient/Family education, Self Care, Joint mobilization, Dry Needling, Electrical stimulation, Spinal mobilization, Cryotherapy, Moist heat, Traction, and Manual therapy.  PLAN FOR NEXT SESSION: start gym activities   Sumner Boast, PT 09/17/2022, 5:52 PM

## 2022-09-19 ENCOUNTER — Ambulatory Visit: Payer: Medicare PPO | Admitting: Physical Therapy

## 2022-09-19 DIAGNOSIS — M5459 Other low back pain: Secondary | ICD-10-CM

## 2022-09-19 DIAGNOSIS — M62830 Muscle spasm of back: Secondary | ICD-10-CM

## 2022-09-19 DIAGNOSIS — M5416 Radiculopathy, lumbar region: Secondary | ICD-10-CM

## 2022-09-19 NOTE — Therapy (Signed)
OUTPATIENT PHYSICAL THERAPY THORACOLUMBAR TREATMENT  Patient Name: Robin Hutchinson MRN: [id number] DOB:01-20-1946, 76 y.o., female Today's Date: 09/19/2022   PT End of Session - 09/19/22 1355     Visit Number 3    Number of Visits 13    Date for PT Re-Evaluation 11/10/22    Authorization Type [employer]    PT Start Time 1355    PT Stop Time 1445    PT Time Calculation (min) 50 min             Past Medical History:  Diagnosis Date   Arthritis    Hypercholesterolemia    Osteoporosis    Past Surgical History:  Procedure Laterality Date   CESAREAN SECTION     TOTAL KNEE ARTHROPLASTY Right 01/20/2019   Procedure: TOTAL KNEE ARTHROPLASTY;  Surgeon: Dorna Leitz, MD;  Location: [facility];  Service: Orthopedics;  Laterality: Right;   Patient Active Problem List   Diagnosis Date Noted   Primary osteoarthritis of right knee 01/20/2019    PCP: Kathyrn Lass  REFERRING PROVIDER: Lucius Conn  REFERRING DIAG: LBP, sciatica  Rationale for Evaluation and Treatment Rehabilitation  THERAPY DIAG:  Other low back pain  Muscle spasm of back  Radiculopathy, lumbar region  ONSET DATE: August 2023  SUBJECTIVE:                                                                                                                                                                                           SUBJECTIVE STATEMENT: Feeling better no pain PERTINENT HISTORY:  above  PAIN:  Are you having pain? Yes: NPRS scale: 0/10 Pain location: low back and bilateral buttocks Pain description: tight mm, pulling Aggravating factors: standing, bending twisitng pain up to 8/10 Relieving factors: Tylenol, meloxicam   PRECAUTIONS: None  WEIGHT BEARING RESTRICTIONS No  FALLS:  Has patient fallen in last 6 months? No  LIVING ENVIRONMENT: Lives with: lives with their family Lives in: House/apartment Stairs: No Has following equipment at home: None  OCCUPATION: retired  PLOF: Independent and  cook and clean  PATIENT GOALS less pain, walk and travel again   OBJECTIVE:   DIAGNOSTIC FINDINGS:  Xrays show scoliosis  PATIENT SURVEYS:  FOTO 6  COGNITION:  Overall cognitive status: Within functional limits for tasks assessed     SENSATION: WFL  MUSCLE LENGTH: Hamstrings: Right 50 deg; Left 50 deg  POSTURE: rounded shoulders and forward head  PALPATION: She is tight in the lumbar and buttocks, tender in the buttocks  LUMBAR ROM:   Active  A/PROM  eval  Flexion Decreased 50%  Extension 50%  Right lateral flexion 50%  Left  lateral flexion 50%  Right rotation   Left rotation    (Blank rows = not tested)  LOWER EXTREMITY ROM:     Active  Right eval Left eval  Hip flexion    Hip extension    Hip abduction    Hip adduction    Hip internal rotation    Hip external rotation    Knee flexion 100 100  Knee extension 10 10  Ankle dorsiflexion    Ankle plantarflexion    Ankle inversion    Ankle eversion     (Blank rows = not tested)  LOWER EXTREMITY MMT:   4-/5 with some low back pain    FUNCTIONAL TESTS:  Timed up and go (TUG): 22  GAIT: Distance walked: 50 feet Assistive device utilized: None Level of assistance: Complete Independence Comments: mild antalgic on the left    TODAY'S TREATMENT   09/19/22  Sit fit 10 x 4 way Red tband scap stab on sit fit Feet on ball bridge,KTC and obl  Isometric abdominal press Hooklying clams and hip flex green tband PROM LE Nustep L 4 6 min Seated row 15# 10 x Lat pull 15# 10 # Black tband trunk ext 2 sets 10 STS with wt ball chest pess 10 x    09/17/22 Pelvic tilts Feet on ball K2C, trunk rotation, small bridges and isometric abs SLR's supine with core activation 2x10 each LE stretches Hooklying green tband clamshells 2x10 MHP/IFC on the low back  09/10/22 MHP/IFC to the L/S area supine   PATIENT EDUCATION:  Education details: Flexion exercises and piriformis stretches Person  educated: Patient Education method: Consulting civil engineer, Media planner, Verbal cues, and Handouts Education comprehension: verbalized understanding   HOME EXERCISE PROGRAM: See above  ASSESSMENT:  CLINICAL IMPRESSION: pt arrived today with no pain and no pain through session. STG met. Progressed ex with cuing needed -weakness noted in core and UE with machine interventions. Tightnes sin BIL HS. Trial with estim and MH as she was not having pain today OBJECTIVE IMPAIRMENTS Abnormal gait, decreased activity tolerance, decreased mobility, difficulty walking, decreased ROM, decreased strength, increased fascial restrictions, increased muscle spasms, impaired flexibility, improper body mechanics, postural dysfunction, and pain.   REHAB POTENTIAL: Good  CLINICAL DECISION MAKING: Stable/uncomplicated  EVALUATION COMPLEXITY: Low   GOALS: Goals reviewed with patient? Yes  SHORT TERM GOALS: Target date: 09/24/22  Independent with initial HEP Goal status: met 09/19/22  LONG TERM GOALS: Target date: 12/12/2022  Understand posture and body mechanics Goal status: INITIAL  2.  Decrease pain 50% Goal status: INITIAL  3.  Increase lumbar ROM 25% Goal status: INITIAL  4.  Walk 30 minutes for exercise Goal status: INITIAL  5.  Tolerate sitting >30 minutes Goal status: INITIAL  PLAN: PT FREQUENCY: 1-2x/week  PT DURATION: 12 weeks  PLANNED INTERVENTIONS: Therapeutic exercises, Therapeutic activity, Neuromuscular re-education, Balance training, Gait training, Patient/Family education, Self Care, Joint mobilization, Dry Needling, Electrical stimulation, Spinal mobilization, Cryotherapy, Moist heat, Traction, and Manual therapy.  PLAN FOR NEXT SESSION: asses gym activities   [place], PTA 09/19/2022, 1:56 PM [place]. [place], [place], [address] Phone: [phone]   Fax:  [phone]  Patient Details  Name: Robin Hutchinson MRN: [id number] Date of Birth: [birth year] Referring Provider:  Kathyrn Lass, MD  Encounter Date: 09/19/2022   Laqueta Carina, PTA 09/19/2022, 1:56 PM  [address] [place], [place], [address] Phone: [phone]   Fax:  [phone]

## 2022-09-24 ENCOUNTER — Ambulatory Visit: Payer: Medicare PPO | Admitting: Physical Therapy

## 2022-09-24 DIAGNOSIS — M5459 Other low back pain: Secondary | ICD-10-CM | POA: Diagnosis not present

## 2022-09-24 DIAGNOSIS — M5416 Radiculopathy, lumbar region: Secondary | ICD-10-CM

## 2022-09-24 DIAGNOSIS — M62830 Muscle spasm of back: Secondary | ICD-10-CM

## 2022-09-24 NOTE — Therapy (Signed)
OUTPATIENT PHYSICAL THERAPY THORACOLUMBAR TREATMENT  Patient Name: Robin Hutchinson MRN: [id number] DOB:May 12, 1946, 76 y.o., female Today's Date: 09/24/2022   PT End of Session - 09/24/22 1314     Visit Number 4    Number of Visits 13    Date for PT Re-Evaluation 11/10/22    Authorization Type [employer]    PT Start Time 1315    PT Stop Time 1400    PT Time Calculation (min) 45 min             Past Medical History:  Diagnosis Date   Arthritis    Hypercholesterolemia    Osteoporosis    Past Surgical History:  Procedure Laterality Date   CESAREAN SECTION     TOTAL KNEE ARTHROPLASTY Right 01/20/2019   Procedure: TOTAL KNEE ARTHROPLASTY;  Surgeon: Dorna Leitz, MD;  Location: [facility];  Service: Orthopedics;  Laterality: Right;   Patient Active Problem List   Diagnosis Date Noted   Primary osteoarthritis of right knee 01/20/2019    PCP: Kathyrn Lass  REFERRING PROVIDER: Lucius Conn  REFERRING DIAG: LBP, sciatica  Rationale for Evaluation and Treatment Rehabilitation  THERAPY DIAG:  Other low back pain  Muscle spasm of back  Radiculopathy, lumbar region  ONSET DATE: August 2023  SUBJECTIVE:                                                                                                                                                                                           SUBJECTIVE STATEMENT: Okay until Sunday then cleaned and now left LB and hip pain PERTINENT HISTORY:  above  PAIN:  Are you having pain? Yes left hip and LB 5/10   PRECAUTIONS: None  WEIGHT BEARING RESTRICTIONS No  FALLS:  Has patient fallen in last 6 months? No  LIVING ENVIRONMENT: Lives with: lives with their family Lives in: House/apartment Stairs: No Has following equipment at home: None  OCCUPATION: retired  PLOF: Independent and cook and clean  PATIENT GOALS less pain, walk and travel again   OBJECTIVE:   DIAGNOSTIC FINDINGS:  Xrays show scoliosis  PATIENT SURVEYS:   FOTO 43  COGNITION:  Overall cognitive status: Within functional limits for tasks assessed     SENSATION: WFL  MUSCLE LENGTH: Hamstrings: Right 50 deg; Left 50 deg  POSTURE: rounded shoulders and forward head  PALPATION: She is tight in the lumbar and buttocks, tender in the buttocks  LUMBAR ROM:   Active  A/PROM  eval  Flexion Decreased 50%  Extension 50%  Right lateral flexion 50%  Left lateral flexion 50%  Right rotation   Left rotation    (Blank rows =  not tested)  LOWER EXTREMITY ROM:     Active  Right eval Left eval  Hip flexion    Hip extension    Hip abduction    Hip adduction    Hip internal rotation    Hip external rotation    Knee flexion 100 100  Knee extension 10 10  Ankle dorsiflexion    Ankle plantarflexion    Ankle inversion    Ankle eversion     (Blank rows = not tested)  LOWER EXTREMITY MMT:   4-/5 with some low back pain    FUNCTIONAL TESTS:  Timed up and go (TUG): 22  GAIT: Distance walked: 50 feet Assistive device utilized: None Level of assistance: Complete Independence Comments: mild antalgic on the left    TODAY'S TREATMENT   09/24/22  Educ of good BM for ADL with squating and reaching to avoid injury- get close to obj and use LE Sit fit 10 x 4 way Red tband scap stab on sit fit. LE strengthening with stab while on sit fit 15 x each Feet on ball bridge,KTC and obl 15 x Isometric abdominal press 15 x PROM LE/trunk Nustep L 4 6 min Black tband trunk flex and ext IFC/MH Left LB 15 min supine   09/19/22  Sit fit 10 x 4 way Red tband scap stab on sit fit Feet on ball bridge,KTC and obl  Isometric abdominal press Hooklying clams and hip flex green tband PROM LE Nustep L 4 6 min Seated row 15# 10 x Lat pull 15# 10 # Black tband trunk ext 2 sets 10 STS with wt ball chest pess 10 x    09/17/22 Pelvic tilts Feet on ball K2C, trunk rotation, small bridges and isometric abs SLR's supine with core activation  2x10 each LE stretches Hooklying green tband clamshells 2x10 MHP/IFC on the low back  09/10/22 MHP/IFC to the L/S area supine   PATIENT EDUCATION:  Education details: Flexion exercises and piriformis stretches Person educated: Patient Education method: Consulting civil engineer, Media planner, Verbal cues, and Handouts Education comprehension: verbalized understanding   HOME EXERCISE PROGRAM: See above  ASSESSMENT:  CLINICAL IMPRESSION: pt arrived today with increased LB L>RT after cleaning on SUnday. Spend time educ pt on correct BM to avoid strain on back. Progressed ex and did estim for pain control OBJECTIVE IMPAIRMENTS Abnormal gait, decreased activity tolerance, decreased mobility, difficulty walking, decreased ROM, decreased strength, increased fascial restrictions, increased muscle spasms, impaired flexibility, improper body mechanics, postural dysfunction, and pain.   REHAB POTENTIAL: Good  CLINICAL DECISION MAKING: Stable/uncomplicated  EVALUATION COMPLEXITY: Low   GOALS: Goals reviewed with patient? Yes  SHORT TERM GOALS: Target date: 09/24/22  Independent with initial HEP Goal status: met 09/19/22  LONG TERM GOALS: Target date: 12/17/2022  Understand posture and body mechanics Goal status: INITIAL  2.  Decrease pain 50% Goal status: INITIAL  3.  Increase lumbar ROM 25% Goal status: INITIAL  4.  Walk 30 minutes for exercise Goal status: INITIAL  5.  Tolerate sitting >30 minutes Goal status: INITIAL  PLAN: PT FREQUENCY: 1-2x/week  PT DURATION: 12 weeks  PLANNED INTERVENTIONS: Therapeutic exercises, Therapeutic activity, Neuromuscular re-education, Balance training, Gait training, Patient/Family education, Self Care, Joint mobilization, Dry Needling, Electrical stimulation, Spinal mobilization, Cryotherapy, Moist heat, Traction, and Manual therapy.  PLAN FOR NEXT SESSION: asses and progress   Shaun Runyon,ANGIE, PTA 09/24/2022, 1:14 PM [place]. [place], [place], [address] Phone: [phone]   Fax:  [phone]  Patient Details  Name: Robin Hutchinson MRN: [id number] Date of Birth: 06-May-1946 Referring Provider:  Kathyrn Lass, MD  Encounter Date: 09/24/2022   Laqueta Carina, PTA 09/24/2022, 1:14 PM  [place]. [place], [place], [address] Phone: [phone]   Fax:  [place]. [place], [place], [address] Phone: [phone]   Fax:  [phone]  Patient Details  Name: Robin Hutchinson MRN: [id number] Date of Birth: Jun 09, 1946 Referring Provider:  Kathyrn Lass, MD  Encounter Date: 09/24/2022   Laqueta Carina, PTA 09/24/2022, 1:14 PM  [place]. [place], [place], [address] Phone: [phone]   Fax:  [phone]

## 2022-09-26 ENCOUNTER — Ambulatory Visit: Payer: Medicare PPO | Admitting: Physical Therapy

## 2022-09-26 DIAGNOSIS — M5459 Other low back pain: Secondary | ICD-10-CM | POA: Diagnosis not present

## 2022-09-26 DIAGNOSIS — M62830 Muscle spasm of back: Secondary | ICD-10-CM

## 2022-09-26 NOTE — Therapy (Signed)
OUTPATIENT PHYSICAL THERAPY THORACOLUMBAR TREATMENT  Patient Name: Robin Hutchinson MRN: [id number] DOB:28-Sep-1946, 76 y.o., female Today's Date: 09/26/2022   PT End of Session - 09/26/22 1019     Visit Number 5    Number of Visits 13    Date for PT Re-Evaluation 11/10/22    Authorization Type [employer]    PT Start Time 1791    PT Stop Time 1100    PT Time Calculation (min) 45 min             Past Medical History:  Diagnosis Date   Arthritis    Hypercholesterolemia    Osteoporosis    Past Surgical History:  Procedure Laterality Date   CESAREAN SECTION     TOTAL KNEE ARTHROPLASTY Right 01/20/2019   Procedure: TOTAL KNEE ARTHROPLASTY;  Surgeon: Dorna Leitz, MD;  Location: [facility];  Service: Orthopedics;  Laterality: Right;   Patient Active Problem List   Diagnosis Date Noted   Primary osteoarthritis of right knee 01/20/2019    PCP: Kathyrn Lass  REFERRING PROVIDER: Lucius Conn  REFERRING DIAG: LBP, sciatica  Rationale for Evaluation and Treatment Rehabilitation  THERAPY DIAG:  Other low back pain  Muscle spasm of back  ONSET DATE: August 2023  SUBJECTIVE:                                                                                                                                                                                           SUBJECTIVE STATEMENT: Okay until Sunday then cleaned and now left LB and hip pain PERTINENT HISTORY:  above  PAIN:  Are you having pain? Yes left hip and LB 5/10   PRECAUTIONS: None  WEIGHT BEARING RESTRICTIONS No  FALLS:  Has patient fallen in last 6 months? No  LIVING ENVIRONMENT: Lives with: lives with their family Lives in: House/apartment Stairs: No Has following equipment at home: None  OCCUPATION: retired  PLOF: Independent and cook and clean  PATIENT GOALS less pain, walk and travel again   OBJECTIVE:   DIAGNOSTIC FINDINGS:  Xrays show scoliosis  PATIENT SURVEYS:  FOTO 43  COGNITION:  Overall  cognitive status: Within functional limits for tasks assessed     SENSATION: WFL  MUSCLE LENGTH: Hamstrings: Right 50 deg; Left 50 deg  POSTURE: rounded shoulders and forward head  PALPATION: She is tight in the lumbar and buttocks, tender in the buttocks  LUMBAR ROM:   Active  A/PROM  eval  Flexion Decreased 50%  Extension 50%  Right lateral flexion 50%  Left lateral flexion 50%  Right rotation   Left rotation    (Blank rows = not tested)  LOWER EXTREMITY ROM:     Active  Right eval Left eval  Hip flexion    Hip extension    Hip abduction    Hip adduction    Hip internal rotation    Hip external rotation    Knee flexion 100 100  Knee extension 10 10  Ankle dorsiflexion    Ankle plantarflexion    Ankle inversion    Ankle eversion     (Blank rows = not tested)  LOWER EXTREMITY MMT:   4-/5 with some low back pain    FUNCTIONAL TESTS:  Timed up and go (TUG): 22  GAIT: Distance walked: 50 feet Assistive device utilized: None Level of assistance: Complete Independence Comments: mild antalgic on the left    TODAY'S TREATMENT   09/24/22  Educ of good BM for ADL with squating and reaching to avoid injury- get close to obj and use LE Sit fit 10 x 4 way Red tband scap stab on sit fit. LE strengthening with stab while on sit fit 15 x each Feet on ball bridge,KTC and obl 15 x Isometric abdominal press 15 x PROM LE/trunk Nustep L 4 6 min Black tband trunk flex and ext IFC/MH Left LB 15 min supine   09/19/22  Sit fit 10 x 4 way Red tband scap stab on sit fit Feet on ball bridge,KTC and obl  Isometric abdominal press Hooklying clams and hip flex green tband PROM LE Nustep L 4 6 min Seated row 15# 10 x Lat pull 15# 10 # Black tband trunk ext 2 sets 10 STS with wt ball chest pess 10 x    09/17/22 Pelvic tilts Feet on ball K2C, trunk rotation, small bridges and isometric abs SLR's supine with core activation 2x10 each LE stretches Hooklying  green tband clamshells 2x10 MHP/IFC on the low back  09/10/22 MHP/IFC to the L/S area supine   PATIENT EDUCATION:  Education details: Flexion exercises and piriformis stretches Person educated: Patient Education method: Consulting civil engineer, Media planner, Verbal cues, and Handouts Education comprehension: verbalized understanding   HOME EXERCISE PROGRAM: See above  ASSESSMENT:  CLINICAL IMPRESSION: pt arrived today with increased LB L>RT after cleaning on SUnday. Spend time educ pt on correct BM to avoid strain on back. Progressed ex and did estim for pain control OBJECTIVE IMPAIRMENTS Abnormal gait, decreased activity tolerance, decreased mobility, difficulty walking, decreased ROM, decreased strength, increased fascial restrictions, increased muscle spasms, impaired flexibility, improper body mechanics, postural dysfunction, and pain.   REHAB POTENTIAL: Good  CLINICAL DECISION MAKING: Stable/uncomplicated  EVALUATION COMPLEXITY: Low   GOALS: Goals reviewed with patient? Yes  SHORT TERM GOALS: Target date: 09/24/22  Independent with initial HEP Goal status: met 09/19/22  LONG TERM GOALS: Target date: 12/19/2022  Understand posture and body mechanics Goal status: INITIAL  2.  Decrease pain 50% Goal status: INITIAL  3.  Increase lumbar ROM 25% Goal status: INITIAL  4.  Walk 30 minutes for exercise Goal status: INITIAL  5.  Tolerate sitting >30 minutes Goal status: INITIAL  PLAN: PT FREQUENCY: 1-2x/week  PT DURATION: 12 weeks  PLANNED INTERVENTIONS: Therapeutic exercises, Therapeutic activity, Neuromuscular re-education, Balance training, Gait training, Patient/Family education, Self Care, Joint mobilization, Dry Needling, Electrical stimulation, Spinal mobilization, Cryotherapy, Moist heat, Traction, and Manual therapy.  PLAN FOR NEXT SESSION: asses and progress   Danikah Budzik,ANGIE, PTA 09/26/2022, 10:20 AM [place]. [place], [place], [address] Phone: [phone]   Fax:  [phone]  Patient Details  Name: Robin Hutchinson MRN: [id number] Date of Birth: August 24, 1946 Referring Provider:  Kathyrn Lass, MD  Encounter Date: 09/26/2022   Laqueta Carina, PTA 09/26/2022, 10:20 AM  [place] [place], [place], [address] Phone: [phone]   Fax:  [place]. [place], [place], [address] Phone: [phone]   Fax:  [phone]  Patient Details  Name: Robin Hutchinson MRN: [id number] Date of Birth: January 23, 1946 Referring Provider:  Kathyrn Lass, MD  Encounter Date: 09/26/2022   Laqueta Carina, PTA 09/26/2022, 10:20 AM  [place]. [place], [place], [address] Phone: [phone]   Fax:  [phone] [place] [facility]  Patient Name: Robin Hutchinson MRN: [id number] DOB:Apr 26, 1946, 76 y.o., female Today's Date: 09/26/2022   PT End of Session - 09/26/22 1019     Visit Number 5    Number of Visits 13    Date for PT Re-Evaluation 11/10/22    Authorization Type [employer]    PT Start Time 7616    PT Stop Time 1100    PT Time Calculation (min) 45 min             Past Medical History:  Diagnosis Date   Arthritis    Hypercholesterolemia    Osteoporosis    Past Surgical History:  Procedure Laterality Date   CESAREAN SECTION     TOTAL KNEE ARTHROPLASTY Right 01/20/2019   Procedure: TOTAL KNEE ARTHROPLASTY;  Surgeon: Dorna Leitz, MD;  Location: [facility];  Service: Orthopedics;  Laterality: Right;   Patient Active Problem List   Diagnosis Date Noted   Primary osteoarthritis of right knee 01/20/2019    PCP: Kathyrn Lass  REFERRING PROVIDER: Lucius Conn  REFERRING DIAG: LBP, sciatica  Rationale for Evaluation and Treatment Rehabilitation  THERAPY  DIAG:  Other low back pain  Muscle spasm of back  ONSET DATE: August 2023  SUBJECTIVE:                                                                                                                                                                                           SUBJECTIVE STATEMENT: Better than I was and watching the way I do things PERTINENT HISTORY:  above  PAIN:  Are you having pain? Yes LB 2/10   PRECAUTIONS: None  WEIGHT BEARING RESTRICTIONS No  FALLS:  Has patient fallen in last 6 months? No  LIVING ENVIRONMENT: Lives with: lives with their family Lives in: House/apartment Stairs: No Has following equipment at home: None  OCCUPATION: retired  PLOF: Independent and cook and  clean  PATIENT GOALS less pain, walk and travel again   OBJECTIVE:   DIAGNOSTIC FINDINGS:  Xrays show scoliosis  PATIENT SURVEYS:  FOTO 43  COGNITION:  Overall cognitive status: Within functional limits for tasks assessed     SENSATION: WFL  MUSCLE LENGTH: Hamstrings: Right 50 deg; Left 50 deg  POSTURE: rounded shoulders and forward head  PALPATION: She is tight in the lumbar and buttocks, tender in the buttocks  LUMBAR ROM:   Active  A/PROM  eval  Flexion Decreased 50%  Extension 50%  Right lateral flexion 50%  Left lateral flexion 50%  Right rotation   Left rotation    (Blank rows = not tested)  LOWER EXTREMITY ROM:     Active  Right eval Left eval  Hip flexion    Hip extension    Hip abduction    Hip adduction    Hip internal rotation    Hip external rotation    Knee flexion 100 100  Knee extension 10 10  Ankle dorsiflexion    Ankle plantarflexion    Ankle inversion    Ankle eversion     (Blank rows = not tested)  LOWER EXTREMITY MMT:   4-/5 with some low back pain    FUNCTIONAL TESTS:  Timed up and go (TUG): 22  GAIT: Distance walked: 50 feet Assistive device utilized: None Level of assistance: Complete Independence Comments:  mild antalgic on the left    TODAY'S TREATMENT   09/26/22   Nustep L 5 41mn Black tband trunk flex and ext 20 x Seated row 15# 10 x 2 sets Lat pull 15# 10 x  Leg Press 20# 2 sets 10  Resisted gait 20# 4 x fwd and backward 3 x each side Wt ball trunk ext ( [facility] and [facility] 10 x each MH/IFC BIL LB supine 15 min    09/24/22  Educ of good BM for ADL with squating and reaching to avoid injury- get close to obj and use LE Sit fit 10 x 4 way Red tband scap stab on sit fit. LE strengthening with stab while on sit fit 15 x each Feet on ball bridge,KTC and obl 15 x Isometric abdominal press 15 x PROM LE/trunk Nustep L 4 6 min Black tband trunk flex and ext IFC/MH Left LB 15 min supine   09/19/22  Sit fit 10 x 4 way Red tband scap stab on sit fit Feet on ball bridge,KTC and obl  Isometric abdominal press Hooklying clams and hip flex green tband PROM LE Nustep L 4 6 min Seated row 15# 10 x Lat pull 15# 10 # Black tband trunk ext 2 sets 10 STS with wt ball chest pess 10 x    09/17/22 Pelvic tilts Feet on ball K2C, trunk rotation, small bridges and isometric abs SLR's supine with core activation 2x10 each LE stretches Hooklying green tband clamshells 2x10 MHP/IFC on the low back  09/10/22 MHP/IFC to the L/S area supine   PATIENT EDUCATION:  Education details: Flexion exercises and piriformis stretches Person educated: Patient Education method: EConsulting civil engineer DMedia planner Verbal cues, and Handouts Education comprehension: verbalized understanding   HOME EXERCISE PROGRAM: See above  ASSESSMENT:  CLINICAL IMPRESSION:  progressed strengthening- leg press needed to be > 90 knee flexion, resisted gait needed cuing and limited trunke xt with wt ball ex. LTG #! met  OBJECTIVE IMPAIRMENTS Abnormal gait, decreased activity tolerance, decreased mobility, difficulty walking, decreased ROM, decreased strength, increased fascial restrictions, increased  muscle  spasms, impaired flexibility, improper body mechanics, postural dysfunction, and pain.   REHAB POTENTIAL: Good  CLINICAL DECISION MAKING: Stable/uncomplicated  EVALUATION COMPLEXITY: Low   GOALS: Goals reviewed with patient? Yes  SHORT TERM GOALS: Target date: 09/24/22  Independent with initial HEP Goal status: met 09/19/22  LONG TERM GOALS: Target date: 12/19/2022  Understand posture and body mechanics Goal status: 09/26/22 MET  2.  Decrease pain 50% Goal status: INITIAL  3.  Increase lumbar ROM 25% Goal status: INITIAL  4.  Walk 30 minutes for exercise Goal status: INITIAL  5.  Tolerate sitting >30 minutes Goal status: INITIAL  PLAN: PT FREQUENCY: 1-2x/week  PT DURATION: 12 weeks  PLANNED INTERVENTIONS: Therapeutic exercises, Therapeutic activity, Neuromuscular re-education, Balance training, Gait training, Patient/Family education, Self Care, Joint mobilization, Dry Needling, Electrical stimulation, Spinal mobilization, Cryotherapy, Moist heat, Traction, and Manual therapy.  PLAN FOR NEXT SESSION:progress core and LE strength   Sachit Gilman,ANGIE, PTA 09/26/2022, 10:20 AM [place]. [place], [place], [address] Phone: [phone]   Fax:  [phone]  Patient Details  Name: Robin Hutchinson MRN: [id number] Date of Birth: 08-Feb-1946 Referring Provider:  Kathyrn Lass, MD  Encounter Date: 09/26/2022   Laqueta Carina, PTA 09/26/2022, 10:20 AM  [place]. [place], [place], [address] Phone: [phone]   Fax:  [place]. [place], [place], [address] Phone: [phone]   Fax:  [phone]  Patient Details  Name: Robin Hutchinson MRN: [id number] Date of Birth: Apr 22, 1946 Referring Provider:  Kathyrn Lass, MD  Encounter Date: 09/26/2022   Laqueta Carina,  PTA 09/26/2022, 10:20 AM  [place]. [place], [place], [address] Phone: [phone]   Fax:  [place]. [place], [place], [address] Phone: [phone]   Fax:  [phone]  Patient Details  Name: Robin Hutchinson MRN: [id number] Date of Birth: 11-07-1946 Referring Provider:  Kathyrn Lass, MD  Encounter Date: 09/26/2022   Laqueta Carina, PTA 09/26/2022, 10:20 AM  [place]. [place], [place], [address] Phone: [phone]   Fax:  [phone]

## 2022-10-15 ENCOUNTER — Ambulatory Visit: Payer: Medicare PPO | Attending: Family Medicine | Admitting: Physical Therapy

## 2022-10-15 DIAGNOSIS — M62830 Muscle spasm of back: Secondary | ICD-10-CM | POA: Diagnosis present

## 2022-10-15 DIAGNOSIS — M5416 Radiculopathy, lumbar region: Secondary | ICD-10-CM | POA: Insufficient documentation

## 2022-10-15 DIAGNOSIS — M5459 Other low back pain: Secondary | ICD-10-CM | POA: Diagnosis not present

## 2022-10-15 NOTE — Therapy (Signed)
OUTPATIENT PHYSICAL THERAPY THORACOLUMBAR TREATMENT  Patient Name: Robin Hutchinson MRN: [id number] DOB:[birth year], 76 y.o., female Today's Date: 10/15/2022   PT End of Session - 10/15/22 1149     Visit Number 6    Number of Visits 13    Date for PT Re-Evaluation 11/10/22    PT Start Time 9379    PT Stop Time 0240    PT Time Calculation (min) 50 min             Past Medical History:  Diagnosis Date   Arthritis    Hypercholesterolemia    Osteoporosis    Past Surgical History:  Procedure Laterality Date   CESAREAN SECTION     TOTAL KNEE ARTHROPLASTY Right 01/20/2019   Procedure: TOTAL KNEE ARTHROPLASTY;  Surgeon: Dorna Leitz, MD;  Location: [facility];  Service: Orthopedics;  Laterality: Right;   Patient Active Problem List   Diagnosis Date Noted   Primary osteoarthritis of right knee 01/20/2019    PCP: Kathyrn Lass  REFERRING PROVIDER: Lucius Conn  REFERRING DIAG: LBP, sciatica  Rationale for Evaluation and Treatment Rehabilitation  THERAPY DIAG:  Other low back pain  ONSET DATE: August 2023  SUBJECTIVE:                                                                                                                                                                                           SUBJECTIVE STATEMENT: Pt states she has not been here in a couple weeks as her and her husband have had too many MD appts. Pt states doing HEP and LB is better, but left hip is painful and co chronic shld pain Left >RT PERTINENT HISTORY:  above  PAIN:  Are you having pain? Yes left hip 5/10   PRECAUTIONS: None  WEIGHT BEARING RESTRICTIONS No  FALLS:  Has patient fallen in last 6 months? No  LIVING ENVIRONMENT: Lives with: lives with their family Lives in: House/apartment Stairs: No Has following equipment at home: None  OCCUPATION: retired  PLOF: Independent and cook and clean  PATIENT GOALS less pain, walk and travel again   OBJECTIVE:   DIAGNOSTIC FINDINGS:   Xrays show scoliosis  PATIENT SURVEYS:  FOTO 43  COGNITION:  Overall cognitive status: Within functional limits for tasks assessed     SENSATION: WFL  MUSCLE LENGTH: Hamstrings: Right 50 deg; Left 50 deg  POSTURE: rounded shoulders and forward head  PALPATION: She is tight in the lumbar and buttocks, tender in the buttocks  LUMBAR ROM:   Active  A/PROM  eval 10/15/22  Flexion Decreased 50% WFLs  Extension 50% Decreased 50%  Right lateral flexion 50%  WFLS  Left lateral flexion 50% WFLs  Right rotation    Left rotation     (Blank rows = not tested)  LOWER EXTREMITY ROM:     Active  Right eval Left eval  Hip flexion    Hip extension    Hip abduction    Hip adduction    Hip internal rotation    Hip external rotation    Knee flexion 100 100  Knee extension 10 10  Ankle dorsiflexion    Ankle plantarflexion    Ankle inversion    Ankle eversion     (Blank rows = not tested)  LOWER EXTREMITY MMT:   4-/5 with some low back pain    FUNCTIONAL TESTS:  Timed up and go (TUG): 22  GAIT: Distance walked: 50 feet Assistive device utilized: None Level of assistance: Complete Independence Comments: mild antalgic on the left    TODAY'S TREATMENT   10/15/22 Nustep L 4 71mn 20# resisted gait 5 x fwd and 3 x each side STS with wt ball chest press 10 x Trunk flex and ext black tband Seated row 20# 2 sets 10 Red tband BIL hip 3 way 10 x each IFC and MH LEft LB and hip with MH for shld 15 min  09/24/22  Educ of good BM for ADL with squating and reaching to avoid injury- get close to obj and use LE Sit fit 10 x 4 way Red tband scap stab on sit fit. LE strengthening with stab while on sit fit 15 x each Feet on ball bridge,KTC and obl 15 x Isometric abdominal press 15 x PROM LE/trunk Nustep L 4 6 min Black tband trunk flex and ext IFC/MH Left LB 15 min supine   09/19/22  Sit fit 10 x 4 way Red tband scap stab on sit fit Feet on ball bridge,KTC and obl   Isometric abdominal press Hooklying clams and hip flex green tband PROM LE Nustep L 4 6 min Seated row 15# 10 x Lat pull 15# 10 # Black tband trunk ext 2 sets 10 STS with wt ball chest pess 10 x    09/17/22 Pelvic tilts Feet on ball K2C, trunk rotation, small bridges and isometric abs SLR's supine with core activation 2x10 each LE stretches Hooklying green tband clamshells 2x10 MHP/IFC on the low back  09/10/22 MHP/IFC to the L/S area supine   PATIENT EDUCATION:  Education details: Flexion exercises and piriformis stretches Person educated: Patient Education method: EConsulting civil engineer DMedia planner Verbal cues, and Handouts Education comprehension: verbalized understanding   HOME EXERCISE PROGRAM: See above  ASSESSMENT:  CLINICAL IMPRESSION: goals and ROM assessed ( see noted). Pt retruns after being gone for a couple weeks d/t too many other appts. LB better with HEP, left hip still issue and verb chronic BIL shld pain. Progressed ex in clinic with cuing for posture and control of mvmt OBJECTIVE IMPAIRMENTS Abnormal gait, decreased activity tolerance, decreased mobility, difficulty walking, decreased ROM, decreased strength, increased fascial restrictions, increased muscle spasms, impaired flexibility, improper body mechanics, postural dysfunction, and pain.   REHAB POTENTIAL: Good  CLINICAL DECISION MAKING: Stable/uncomplicated  EVALUATION COMPLEXITY: Low   GOALS: Goals reviewed with patient? Yes  SHORT TERM GOALS: Target date: 09/24/22  Independent with initial HEP Goal status: met 09/19/22  LONG TERM GOALS: Target date: 01/07/2023  Understand posture and body mechanics Goal status: INITIAL  2.  Decrease pain 50% Goal status: progressing 10/15/22  3.  Increase lumbar ROM 25% Goal status: 10/15/22 progressing  4.  Walk 30 minutes for exercise Goal status: 10/15/22 20-30 min  5.  Tolerate sitting >30 minutes Goal status: 10/15/22 MET  PLAN: PT  FREQUENCY: 1-2x/week  PT DURATION: 12 weeks  PLANNED INTERVENTIONS: Therapeutic exercises, Therapeutic activity, Neuromuscular re-education, Balance training, Gait training, Patient/Family education, Self Care, Joint mobilization, Dry Needling, Electrical stimulation, Spinal mobilization, Cryotherapy, Moist heat, Traction, and Manual therapy.  PLAN FOR NEXT SESSION: asses and progress   Izayah Miner,ANGIE, PTA 10/15/2022, 11:49 AM [place]. [place], [place], [address] Phone: [phone]   Fax:  [phone]  Patient Details  Name: Robin Hutchinson MRN: [id number] Date of Birth: [birth year] Referring Provider:  Loura Pardon, MD  Encounter Date: 10/15/2022   Laqueta Carina, PTA 10/15/2022, 11:49 AM  [place]. [place], [place], [address] Phone: [phone]   Fax:  [place]. [place], [place], [address] Phone: [phone]   Fax:  [phone]  Patient Details  Name: Robin Hutchinson MRN: [id number] Date of Birth: 01/29/1946 Referring Provider:  Loura Pardon, MD  Encounter Date: 10/15/2022   Laqueta Carina, PTA 10/15/2022, 11:49 AM  [place]. [place], [place], [address] Phone: [phone]   Fax:  [facility]  Patient Name: Robin Hutchinson MRN: [id number] DOB:10/23/1946, 76 y.o., female Today's Date: 10/15/2022   PT End of Session - 10/15/22 1149     Visit Number 6    Number of Visits 13    Date for PT Re-Evaluation 11/10/22    PT Start Time 1859    PT Stop Time 0931    PT Time Calculation (min) 50 min             Past Medical History:  Diagnosis Date   Arthritis    Hypercholesterolemia    Osteoporosis    Past Surgical History:  Procedure  Laterality Date   CESAREAN SECTION     TOTAL KNEE ARTHROPLASTY Right 01/20/2019   Procedure: TOTAL KNEE ARTHROPLASTY;  Surgeon: Dorna Leitz, MD;  Location: [facility];  Service: Orthopedics;  Laterality: Right;   Patient Active Problem List   Diagnosis Date Noted   Primary osteoarthritis of right knee 01/20/2019    PCP: Kathyrn Lass  REFERRING PROVIDER: Lucius Conn  REFERRING DIAG: LBP, sciatica  Rationale for Evaluation and Treatment Rehabilitation  THERAPY DIAG:  Other low back pain  ONSET DATE: August 2023  SUBJECTIVE:  SUBJECTIVE STATEMENT: Better than I was and watching the way I do things PERTINENT HISTORY:  above  PAIN:  Are you having pain? Yes LB 2/10   PRECAUTIONS: None  WEIGHT BEARING RESTRICTIONS No  FALLS:  Has patient fallen in last 6 months? No  LIVING ENVIRONMENT: Lives with: lives with their family Lives in: House/apartment Stairs: No Has following equipment at home: None  OCCUPATION: retired  PLOF: Independent and cook and clean  PATIENT GOALS less pain, walk and travel again   OBJECTIVE:   DIAGNOSTIC FINDINGS:  Xrays show scoliosis  PATIENT SURVEYS:  FOTO 43  COGNITION:  Overall cognitive status: Within functional limits for tasks assessed     SENSATION: WFL  MUSCLE LENGTH: Hamstrings: Right 50 deg; Left 50 deg  POSTURE: rounded shoulders and forward head  PALPATION: She is tight in the lumbar and buttocks, tender in the buttocks  LUMBAR ROM:   Active  A/PROM  eval  Flexion Decreased 50%  Extension 50%  Right lateral flexion 50%  Left lateral flexion 50%  Right rotation   Left rotation    (Blank rows = not tested)  LOWER EXTREMITY ROM:     Active  Right eval Left eval  Hip flexion    Hip extension    Hip abduction    Hip  adduction    Hip internal rotation    Hip external rotation    Knee flexion 100 100  Knee extension 10 10  Ankle dorsiflexion    Ankle plantarflexion    Ankle inversion    Ankle eversion     (Blank rows = not tested)  LOWER EXTREMITY MMT:   4-/5 with some low back pain    FUNCTIONAL TESTS:  Timed up and go (TUG): 22  GAIT: Distance walked: 50 feet Assistive device utilized: None Level of assistance: Complete Independence Comments: mild antalgic on the left    TODAY'S TREATMENT   09/26/22   Nustep L 5 61mn Black tband trunk flex and ext 20 x Seated row 15# 10 x 2 sets Lat pull 15# 10 x  Leg Press 20# 2 sets 10  Resisted gait 20# 4 x fwd and backward 3 x each side Wt ball trunk ext ( [facility] and rotation 10 x each MH/IFC BIL LB supine 15 min    09/24/22  Educ of good BM for ADL with squating and reaching to avoid injury- get close to obj and use LE Sit fit 10 x 4 way Red tband scap stab on sit fit. LE strengthening with stab while on sit fit 15 x each Feet on ball bridge,KTC and obl 15 x Isometric abdominal press 15 x PROM LE/trunk Nustep L 4 6 min Black tband trunk flex and ext IFC/MH Left LB 15 min supine   09/19/22  Sit fit 10 x 4 way Red tband scap stab on sit fit Feet on ball bridge,KTC and obl  Isometric abdominal press Hooklying clams and hip flex green tband PROM LE Nustep L 4 6 min Seated row 15# 10 x Lat pull 15# 10 # Black tband trunk ext 2 sets 10 STS with wt ball chest pess 10 x    09/17/22 Pelvic tilts Feet on ball K2C, trunk rotation, small bridges and isometric abs SLR's supine with core activation 2x10 each LE stretches Hooklying green tband clamshells 2x10 MHP/IFC on the low back  09/10/22 MHP/IFC to the L/S area supine   PATIENT EDUCATION:  Education details: Flexion exercises and piriformis stretches  Person educated: Patient Education method: Explanation, Demonstration, Verbal cues, and Handouts Education  comprehension: verbalized understanding   HOME EXERCISE PROGRAM: See above  ASSESSMENT:  CLINICAL IMPRESSION:  progressed strengthening- leg press needed to be > 90 knee flexion, resisted gait needed cuing and limited trunke xt with wt ball ex. LTG #! met  OBJECTIVE IMPAIRMENTS Abnormal gait, decreased activity tolerance, decreased mobility, difficulty walking, decreased ROM, decreased strength, increased fascial restrictions, increased muscle spasms, impaired flexibility, improper body mechanics, postural dysfunction, and pain.   REHAB POTENTIAL: Good  CLINICAL DECISION MAKING: Stable/uncomplicated  EVALUATION COMPLEXITY: Low   GOALS: Goals reviewed with patient? Yes  SHORT TERM GOALS: Target date: 09/24/22  Independent with initial HEP Goal status: met 09/19/22  LONG TERM GOALS: Target date: 01/07/2023  Understand posture and body mechanics Goal status: 09/26/22 MET  2.  Decrease pain 50% Goal status: INITIAL  3.  Increase lumbar ROM 25% Goal status: INITIAL  4.  Walk 30 minutes for exercise Goal status: INITIAL  5.  Tolerate sitting >30 minutes Goal status: INITIAL  PLAN: PT FREQUENCY: 1-2x/week  PT DURATION: 12 weeks  PLANNED INTERVENTIONS: Therapeutic exercises, Therapeutic activity, Neuromuscular re-education, Balance training, Gait training, Patient/Family education, Self Care, Joint mobilization, Dry Needling, Electrical stimulation, Spinal mobilization, Cryotherapy, Moist heat, Traction, and Manual therapy.  PLAN FOR NEXT SESSION:progress core and LE strength   Shanay Woolman,ANGIE, PTA 10/15/2022, 11:49 AM [place]. [place], [place], [address] Phone: [phone]   Fax:  [phone]  Patient Details  Name: Robin Hutchinson MRN: [id number] Date of Birth: 1946/03/21 Referring Provider:  Loura Pardon, MD  Encounter Date: 10/15/2022   Laqueta Carina, PTA 10/15/2022, 11:49 AM  [place]. [place], [place], [address] Phone: [phone]   Fax:  [place]. [place], [place], [address] Phone: [phone]   Fax:  [phone]  Patient Details  Name: Robin Hutchinson MRN: [id number] Date of Birth: November 16, 1946 Referring Provider:  Loura Pardon, MD  Encounter Date: 10/15/2022   Laqueta Carina, PTA 10/15/2022, 11:49 AM  [place]. [place], [place], [address] Phone: [phone]   Fax:  [place]. [place], [place], [address] Phone: [phone]   Fax:  [phone]  Patient Details  Name: Robin Hutchinson MRN: [id number] Date of Birth: 05-May-1946 Referring Provider:  Loura Pardon, MD  Encounter Date: 10/15/2022   Laqueta Carina, PTA 10/15/2022, 11:49 AM  [place]. [place], [place], [address] Phone: [phone]   Fax:  [place]. [place], [place], [address] Phone: [phone]   Fax:  [phone]  Patient Details  Name: Robin Hutchinson MRN: [id number] Date of Birth: [birth year] Referring Provider:  Loura Pardon, MD  Encounter Date: 10/15/2022   Laqueta Carina, PTA 10/15/2022, 11:49 AM  [place]. [place], [place], [address] Phone: [phone]   Fax:  [phone]

## 2022-10-17 ENCOUNTER — Ambulatory Visit: Payer: Medicare PPO | Admitting: Physical Therapy

## 2022-10-17 DIAGNOSIS — M5459 Other low back pain: Secondary | ICD-10-CM | POA: Diagnosis not present

## 2022-10-17 DIAGNOSIS — M62830 Muscle spasm of back: Secondary | ICD-10-CM

## 2022-10-17 NOTE — Therapy (Signed)
OUTPATIENT PHYSICAL THERAPY THORACOLUMBAR TREATMENT  Patient Name: Robin Hutchinson MRN: [id number] DOB:Mar 06, 1946, 76 y.o., female Today's Date: 10/17/2022   PT End of Session - 10/17/22 1140     Visit Number 7    Number of Visits 13    Date for PT Re-Evaluation 11/10/22    Authorization Type [employer]    PT Start Time 1140    PT Stop Time 1230    PT Time Calculation (min) 50 min             Past Medical History:  Diagnosis Date   Arthritis    Hypercholesterolemia    Osteoporosis    Past Surgical History:  Procedure Laterality Date   CESAREAN SECTION     TOTAL KNEE ARTHROPLASTY Right 01/20/2019   Procedure: TOTAL KNEE ARTHROPLASTY;  Surgeon: Dorna Leitz, MD;  Location: [facility];  Service: Orthopedics;  Laterality: Right;   Patient Active Problem List   Diagnosis Date Noted   Primary osteoarthritis of right knee 01/20/2019    PCP: Kathyrn Lass  REFERRING PROVIDER: Lucius Conn  REFERRING DIAG: LBP, sciatica  Rationale for Evaluation and Treatment Rehabilitation  THERAPY DIAG:  Other low back pain  Muscle spasm of back  ONSET DATE: August 2023  SUBJECTIVE:                                                                                                                                                                                           SUBJECTIVE STATEMENT: Pt arrives with shld stretches from past therapy   PERTINENT HISTORY:  above  PAIN:  Are you having pain? Yes left hip 5/10   PRECAUTIONS: None  WEIGHT BEARING RESTRICTIONS No  FALLS:  Has patient fallen in last 6 months? No  LIVING ENVIRONMENT: Lives with: lives with their family Lives in: House/apartment Stairs: No Has following equipment at home: None  OCCUPATION: retired  PLOF: Independent and cook and clean  PATIENT GOALS less pain, walk and travel again   OBJECTIVE:   DIAGNOSTIC FINDINGS:  Xrays show scoliosis  PATIENT SURVEYS:  FOTO 43  COGNITION:  Overall cognitive  status: Within functional limits for tasks assessed     SENSATION: WFL  MUSCLE LENGTH: Hamstrings: Right 50 deg; Left 50 deg  POSTURE: rounded shoulders and forward head  PALPATION: She is tight in the lumbar and buttocks, tender in the buttocks  LUMBAR ROM:   Active  A/PROM  eval 10/15/22  Flexion Decreased 50% WFLs  Extension 50% Decreased 50%  Right lateral flexion 50% WFLS  Left lateral flexion 50% WFLs  Right rotation    Left rotation     (Blank rows =  not tested)  LOWER EXTREMITY ROM:     Active  Right eval Left eval  Hip flexion    Hip extension    Hip abduction    Hip adduction    Hip internal rotation    Hip external rotation    Knee flexion 100 100  Knee extension 10 10  Ankle dorsiflexion    Ankle plantarflexion    Ankle inversion    Ankle eversion     (Blank rows = not tested)  LOWER EXTREMITY MMT:   4-/5 with some low back pain    FUNCTIONAL TESTS:  Timed up and go (TUG): 22  GAIT: Distance walked: 50 feet Assistive device utilized: None Level of assistance: Complete Independence Comments: mild antalgic on the left    TODAY'S TREATMENT   10/17/22 Nustep L 5 6 min [id number]- HEP red tband scap stab and LE 3 way Trunk flex and ext black tband Seated row 20# 2 sets 10 Knee ext 2 sets 10  10# HS curl 2 sets 10  20# STS with wt ball press 10 x IFC and MH LEft LB and hip with MH for shld 15 min   10/15/22 Nustep L 4 60mn 20# resisted gait 5 x fwd and 3 x each side STS with wt ball chest press 10 x Trunk flex and ext black tband Seated row 20# 2 sets 10 Red tband BIL hip 3 way 10 x each IFC and MH LEft LB and hip with MH for shld 15 min  09/24/22  Educ of good BM for ADL with squating and reaching to avoid injury- get close to obj and use LE Sit fit 10 x 4 way Red tband scap stab on sit fit. LE strengthening with stab while on sit fit 15 x each Feet on ball bridge,KTC and obl 15 x Isometric abdominal press 15 x PROM  LE/trunk Nustep L 4 6 min Black tband trunk flex and ext IFC/MH Left LB 15 min supine   09/19/22  Sit fit 10 x 4 way Red tband scap stab on sit fit Feet on ball bridge,KTC and obl  Isometric abdominal press Hooklying clams and hip flex green tband PROM LE Nustep L 4 6 min Seated row 15# 10 x Lat pull 15# 10 # Black tband trunk ext 2 sets 10 STS with wt ball chest pess 10 x    09/17/22 Pelvic tilts Feet on ball K2C, trunk rotation, small bridges and isometric abs SLR's supine with core activation 2x10 each LE stretches Hooklying green tband clamshells 2x10 MHP/IFC on the low back  09/10/22 MHP/IFC to the L/S area supine   PATIENT EDUCATION:  Education details: Flexion exercises and piriformis stretches Person educated: Patient Education method: EConsulting civil engineer DMedia planner Verbal cues, and Handouts Education comprehension: verbalized understanding   HOME EXERCISE PROGRAM: See above  ASSESSMENT:  CLINICAL IMPRESSION: progressed HEP with handout and cuing OBJECTIVE IMPAIRMENTS Abnormal gait, decreased activity tolerance, decreased mobility, difficulty walking, decreased ROM, decreased strength, increased fascial restrictions, increased muscle spasms, impaired flexibility, improper body mechanics, postural dysfunction, and pain.   REHAB POTENTIAL: Good  CLINICAL DECISION MAKING: Stable/uncomplicated  EVALUATION COMPLEXITY: Low   GOALS: Goals reviewed with patient? Yes  SHORT TERM GOALS: Target date: 09/24/22  Independent with initial HEP Goal status: met 09/19/22  LONG TERM GOALS: Target date: 01/09/2023  Understand posture and body mechanics Goal status: INITIAL  2.  Decrease pain 50% Goal status: progressing 10/15/22  3.  Increase lumbar ROM 25% Goal status: 10/15/22 progressing  4.  Walk 30 minutes for exercise Goal status: 10/15/22 20-30 min  5.  Tolerate sitting >30 minutes Goal status: 10/15/22 MET  PLAN: PT FREQUENCY: 1-2x/week  PT  DURATION: 12 weeks  PLANNED INTERVENTIONS: Therapeutic exercises, Therapeutic activity, Neuromuscular re-education, Balance training, Gait training, Patient/Family education, Self Care, Joint mobilization, Dry Needling, Electrical stimulation, Spinal mobilization, Cryotherapy, Moist heat, Traction, and Manual therapy.  PLAN FOR NEXT SESSION: asses and progress   Janda Cargo,ANGIE, PTA 10/17/2022, 11:44 AM [place]. [place], [place], [address] Phone: [phone]   Fax:  [phone]  Patient Details  Name: Shanna Un MRN: [id number] Date of Birth: [birth year] Referring Provider:  Loura Pardon, MD  Encounter Date: 10/17/2022   Laqueta Carina, PTA 10/17/2022, 11:44 AM  [place]. [place], [place], [address] Phone: [phone]   Fax:  [place]. [place], [place], [address] Phone: [phone]   Fax:  [phone]  Patient Details  Name: Melainie Krinsky MRN: [id number] Date of Birth: 12-07-1945 Referring Provider:  Loura Pardon, MD  Encounter Date: 10/17/2022   Laqueta Carina, PTA 10/17/2022, 11:44 AM  [place]. [place], [place], [address] Phone: [phone]   Fax:  [place] [facility]  Patient Name: Zamora Colton MRN: [id number] DOB:Dec 27, 1945, 76 y.o., female Today's Date: 10/17/2022   PT End of Session - 10/17/22 1140     Visit Number 7    Number of Visits 13    Date for PT Re-Evaluation 11/10/22    Authorization Type [employer]    PT Start Time 1140    PT Stop Time 1230    PT Time Calculation (min) 50 min             Past Medical History:  Diagnosis Date   Arthritis    Hypercholesterolemia    Osteoporosis    Past Surgical History:  Procedure  Laterality Date   CESAREAN SECTION     TOTAL KNEE ARTHROPLASTY Right 01/20/2019   Procedure: TOTAL KNEE ARTHROPLASTY;  Surgeon: Dorna Leitz, MD;  Location: [facility];  Service: Orthopedics;  Laterality: Right;   Patient Active Problem List   Diagnosis Date Noted   Primary osteoarthritis of right knee 01/20/2019    PCP: Kathyrn Lass  REFERRING PROVIDER: Lucius Conn  REFERRING DIAG: LBP, sciatica  Rationale for Evaluation and Treatment Rehabilitation  THERAPY DIAG:  Other low back pain  Muscle spasm of back  ONSET DATE: August 2023  SUBJECTIVE:  SUBJECTIVE STATEMENT: Better than I was and watching the way I do things PERTINENT HISTORY:  above  PAIN:  Are you having pain? Yes LB 2/10   PRECAUTIONS: None  WEIGHT BEARING RESTRICTIONS No  FALLS:  Has patient fallen in last 6 months? No  LIVING ENVIRONMENT: Lives with: lives with their family Lives in: House/apartment Stairs: No Has following equipment at home: None  OCCUPATION: retired  PLOF: Independent and cook and clean  PATIENT GOALS less pain, walk and travel again   OBJECTIVE:   DIAGNOSTIC FINDINGS:  Xrays show scoliosis  PATIENT SURVEYS:  FOTO 43  COGNITION:  Overall cognitive status: Within functional limits for tasks assessed     SENSATION: WFL  MUSCLE LENGTH: Hamstrings: Right 50 deg; Left 50 deg  POSTURE: rounded shoulders and forward head  PALPATION: She is tight in the lumbar and buttocks, tender in the buttocks  LUMBAR ROM:   Active  A/PROM  eval  Flexion Decreased 50%  Extension 50%  Right lateral flexion 50%  Left lateral flexion 50%  Right rotation   Left rotation    (Blank rows = not tested)  LOWER EXTREMITY ROM:     Active  Right eval Left eval  Hip flexion    Hip extension     Hip abduction    Hip adduction    Hip internal rotation    Hip external rotation    Knee flexion 100 100  Knee extension 10 10  Ankle dorsiflexion    Ankle plantarflexion    Ankle inversion    Ankle eversion     (Blank rows = not tested)  LOWER EXTREMITY MMT:   4-/5 with some low back pain    FUNCTIONAL TESTS:  Timed up and go (TUG): 22  GAIT: Distance walked: 50 feet Assistive device utilized: None Level of assistance: Complete Independence Comments: mild antalgic on the left    TODAY'S TREATMENT   09/26/22   Nustep L 5 19mn Black tband trunk flex and ext 20 x Seated row 15# 10 x 2 sets Lat pull 15# 10 x  Leg Press 20# 2 sets 10  Resisted gait 20# 4 x fwd and backward 3 x each side Wt ball trunk ext ( [facility] and rotation 10 x each MH/IFC BIL LB supine 15 min    09/24/22  Educ of good BM for ADL with squating and reaching to avoid injury- get close to obj and use LE Sit fit 10 x 4 way Red tband scap stab on sit fit. LE strengthening with stab while on sit fit 15 x each Feet on ball bridge,KTC and obl 15 x Isometric abdominal press 15 x PROM LE/trunk Nustep L 4 6 min Black tband trunk flex and ext IFC/MH Left LB 15 min supine   09/19/22  Sit fit 10 x 4 way Red tband scap stab on sit fit Feet on ball bridge,KTC and obl  Isometric abdominal press Hooklying clams and hip flex green tband PROM LE Nustep L 4 6 min Seated row 15# 10 x Lat pull 15# 10 # Black tband trunk ext 2 sets 10 STS with wt ball chest pess 10 x    09/17/22 Pelvic tilts Feet on ball K2C, trunk rotation, small bridges and isometric abs SLR's supine with core activation 2x10 each LE stretches Hooklying green tband clamshells 2x10 MHP/IFC on the low back  09/10/22 MHP/IFC to the L/S area supine   PATIENT EDUCATION:  Education details: Flexion exercises and piriformis stretches  Person educated: Patient Education method: Explanation, Demonstration, Verbal cues,  and Handouts Education comprehension: verbalized understanding   HOME EXERCISE PROGRAM: See above  ASSESSMENT:  CLINICAL IMPRESSION:  progressed strengthening- leg press needed to be > 90 knee flexion, resisted gait needed cuing and limited trunke xt with wt ball ex. LTG #! met  OBJECTIVE IMPAIRMENTS Abnormal gait, decreased activity tolerance, decreased mobility, difficulty walking, decreased ROM, decreased strength, increased fascial restrictions, increased muscle spasms, impaired flexibility, improper body mechanics, postural dysfunction, and pain.   REHAB POTENTIAL: Good  CLINICAL DECISION MAKING: Stable/uncomplicated  EVALUATION COMPLEXITY: Low   GOALS: Goals reviewed with patient? Yes  SHORT TERM GOALS: Target date: 09/24/22  Independent with initial HEP Goal status: met 09/19/22  LONG TERM GOALS: Target date: 01/09/2023  Understand posture and body mechanics Goal status: 09/26/22 MET  2.  Decrease pain 50% Goal status: INITIAL  3.  Increase lumbar ROM 25% Goal status: INITIAL  4.  Walk 30 minutes for exercise Goal status: INITIAL  5.  Tolerate sitting >30 minutes Goal status: INITIAL  PLAN: PT FREQUENCY: 1-2x/week  PT DURATION: 12 weeks  PLANNED INTERVENTIONS: Therapeutic exercises, Therapeutic activity, Neuromuscular re-education, Balance training, Gait training, Patient/Family education, Self Care, Joint mobilization, Dry Needling, Electrical stimulation, Spinal mobilization, Cryotherapy, Moist heat, Traction, and Manual therapy.  PLAN FOR NEXT SESSION:progress core and LE strength   Morenike Cuff,ANGIE, PTA 10/17/2022, 11:44 AM [place]. [place], [place], [address] Phone: [phone]   Fax:  [phone]  Patient Details  Name: Charina Fons MRN: [id number] Date of Birth: [birth year] Referring Provider:  Loura Pardon, MD  Encounter Date: 10/17/2022   Laqueta Carina, PTA 10/17/2022,  11:44 AM  [place]. [place], [place], [address] Phone: [phone]   Fax:  [place]. [place], [place], [address] Phone: [phone]   Fax:  [phone]  Patient Details  Name: Kiyani Jernigan MRN: [id number] Date of Birth: 07-Aug-1946 Referring Provider:  Loura Pardon, MD  Encounter Date: 10/17/2022   Laqueta Carina, PTA 10/17/2022, 11:44 AM  [place]. [place], [place], [address] Phone: [phone]   Fax:  [place]. [place], [place], [address] Phone: [phone]   Fax:  [phone]  Patient Details  Name: Erian Lariviere MRN: [id number] Date of Birth: August 17, 1946 Referring Provider:  Loura Pardon, MD  Encounter Date: 10/17/2022   Laqueta Carina, PTA 10/17/2022, 11:44 AM  [place]. [place], [place], [address] Phone: [phone]   Fax:  [place]. [place], [place], [address] Phone: [phone]   Fax:  [phone]  Patient Details  Name: Kadedra Vanaken MRN: [id number] Date of Birth: 1946-06-07 Referring Provider:  Loura Pardon, MD  Encounter Date: 10/17/2022   Laqueta Carina, PTA 10/17/2022, 11:44 AM  [place]. [place], [place], [address] Phone: [phone]   Fax:  [place]. [place], [place], [address] Phone: [phone]   Fax:  [phone]  Patient Details  Name: Tarsha Blando MRN: [id number] Date of Birth: [birth year] Referring Provider:  Loura Pardon, MD  Encounter Date:  10/17/2022   Laqueta Carina, PTA 10/17/2022, 11:44 AM  [facility] [address]  [place]. [place], [place], [address] Phone: [phone]   Fax:  [phone]

## 2022-10-22 ENCOUNTER — Ambulatory Visit: Payer: Medicare PPO | Admitting: Physical Therapy

## 2022-10-22 DIAGNOSIS — M5459 Other low back pain: Secondary | ICD-10-CM

## 2022-10-22 DIAGNOSIS — M62830 Muscle spasm of back: Secondary | ICD-10-CM

## 2022-10-22 NOTE — Therapy (Signed)
OUTPATIENT PHYSICAL THERAPY THORACOLUMBAR TREATMENT  Patient Name: Robin Hutchinson MRN: [id number] DOB:February 16, 1946, 76 y.o., female Today's Date: 10/22/2022   PT End of Session - 10/22/22 1317     Visit Number 8    Number of Visits 13    Date for PT Re-Evaluation 11/10/22    PT Start Time 6546    PT Stop Time 1400    PT Time Calculation (min) 45 min             Past Medical History:  Diagnosis Date   Arthritis    Hypercholesterolemia    Osteoporosis    Past Surgical History:  Procedure Laterality Date   CESAREAN SECTION     TOTAL KNEE ARTHROPLASTY Right 01/20/2019   Procedure: TOTAL KNEE ARTHROPLASTY;  Surgeon: Dorna Leitz, MD;  Location: [facility];  Service: Orthopedics;  Laterality: Right;   Patient Active Problem List   Diagnosis Date Noted   Primary osteoarthritis of right knee 01/20/2019    PCP: Kathyrn Lass  REFERRING PROVIDER: Lucius Conn  REFERRING DIAG: LBP, sciatica  Rationale for Evaluation and Treatment Rehabilitation  THERAPY DIAG:  Other low back pain  Muscle spasm of back  ONSET DATE: August 2023  SUBJECTIVE:                                                                                                                                                                                           SUBJECTIVE STATEMENT: Pt arrives stating mild soremess form HEP but they are good. Shld is painful, hip is sore. Saw MD and they are ordering lubrication inj for  rknee   PERTINENT HISTORY:  above  PAIN:  Are you having pain? Yes left hip 2/10, left shld 6/10   PRECAUTIONS: None  WEIGHT BEARING RESTRICTIONS No  FALLS:  Has patient fallen in last 6 months? No  LIVING ENVIRONMENT: Lives with: lives with their family Lives in: House/apartment Stairs: No Has following equipment at home: None  OCCUPATION: retired  PLOF: Independent and cook and clean  PATIENT GOALS less pain, walk and travel again   OBJECTIVE:   DIAGNOSTIC FINDINGS:  Xrays  show scoliosis  PATIENT SURVEYS:  FOTO 43  COGNITION:  Overall cognitive status: Within functional limits for tasks assessed     SENSATION: WFL  MUSCLE LENGTH: Hamstrings: Right 50 deg; Left 50 deg  POSTURE: rounded shoulders and forward head  PALPATION: She is tight in the lumbar and buttocks, tender in the buttocks  LUMBAR ROM:   Active  A/PROM  eval 10/15/22  Flexion Decreased 50% WFLs  Extension 50% Decreased 50%  Right lateral flexion 50% WFLS  Left lateral  flexion 50% WFLs  Right rotation    Left rotation     (Blank rows = not tested)  LOWER EXTREMITY ROM:     Active  Right eval Left eval  Hip flexion    Hip extension    Hip abduction    Hip adduction    Hip internal rotation    Hip external rotation    Knee flexion 100 100  Knee extension 10 10  Ankle dorsiflexion    Ankle plantarflexion    Ankle inversion    Ankle eversion     (Blank rows = not tested)  LOWER EXTREMITY MMT:   4-/5 with some low back pain    FUNCTIONAL TESTS:  Timed up and go (TUG): 22  GAIT: Distance walked: 50 feet Assistive device utilized: None Level of assistance: Complete Independence Comments: mild antalgic on the left    TODAY'S TREATMENT   10/22/22 Bike L4 5 min UBE 2 min wd/2 min backward Lat pull 20# 2 sets 10 Seated row 20# 2 sets 10 Black tband trunk flex/ext  Resisted gait 4 way 4 x each 30# STS wt ball chest press 10 x Wt ball trunk ext and rotation 10 x each Premod left shld/left LB with MH supine   10/17/22 Nustep L 5 6 min [id number] HEP red tband scap stab and LE 3 way Trunk flex and ext black tband Seated row 20# 2 sets 10 Knee ext 2 sets 10  10# HS curl 2 sets 10  20# STS with wt ball press 10 x IFC and MH LEft LB and hip with MH for shld 15 min   10/15/22 Nustep L 4 73mn 20# resisted gait 5 x fwd and 3 x each side STS with wt ball chest press 10 x Trunk flex and ext black tband Seated row 20# 2 sets 10 Red tband BIL hip 3 way  10 x each IFC and MH LEft LB and hip with MH for shld 15 min  09/24/22  Educ of good BM for ADL with squating and reaching to avoid injury- get close to obj and use LE Sit fit 10 x 4 way Red tband scap stab on sit fit. LE strengthening with stab while on sit fit 15 x each Feet on ball bridge,KTC and obl 15 x Isometric abdominal press 15 x PROM LE/trunk Nustep L 4 6 min Black tband trunk flex and ext IFC/MH Left LB 15 min supine   09/19/22  Sit fit 10 x 4 way Red tband scap stab on sit fit Feet on ball bridge,KTC and obl  Isometric abdominal press Hooklying clams and hip flex green tband PROM LE Nustep L 4 6 min Seated row 15# 10 x Lat pull 15# 10 # Black tband trunk ext 2 sets 10 STS with wt ball chest pess 10 x    09/17/22 Pelvic tilts Feet on ball K2C, trunk rotation, small bridges and isometric abs SLR's supine with core activation 2x10 each LE stretches Hooklying green tband clamshells 2x10 MHP/IFC on the low back  09/10/22 MHP/IFC to the L/S area supine   PATIENT EDUCATION:  Education details: Flexion exercises and piriformis stretches Person educated: Patient Education method: EConsulting civil engineer DMedia planner Verbal cues, and Handouts Education comprehension: verbalized understanding   HOME EXERCISE PROGRAM: See above  ASSESSMENT:  CLINICAL IMPRESSION: progressed ex with postural cuing needed. Educ on BM for ADLS to decrease strain on shld/back and LE and added stim to left shld as well d/t that was her most pain today  OBJECTIVE IMPAIRMENTS Abnormal gait, decreased activity tolerance, decreased mobility, difficulty walking, decreased ROM, decreased strength, increased fascial restrictions, increased muscle spasms, impaired flexibility, improper body mechanics, postural dysfunction, and pain.   REHAB POTENTIAL: Good  CLINICAL DECISION MAKING: Stable/uncomplicated  EVALUATION COMPLEXITY: Low   GOALS: Goals reviewed with patient? Yes  SHORT TERM  GOALS: Target date: 09/24/22  Independent with initial HEP Goal status: met 09/19/22  LONG TERM GOALS: Target date: 01/14/2023  Understand posture and body mechanics Goal status: progressing 10/22/22  2.  Decrease pain 50% Goal status: progressing 10/15/22  3.  Increase lumbar ROM 25% Goal status: 10/15/22 progressing  4.  Walk 30 minutes for exercise Goal status: 10/15/22 20-30 min  5.  Tolerate sitting >30 minutes Goal status: 10/15/22 MET  PLAN: PT FREQUENCY: 1-2x/week  PT DURATION: 12 weeks  PLANNED INTERVENTIONS: Therapeutic exercises, Therapeutic activity, Neuromuscular re-education, Balance training, Gait training, Patient/Family education, Self Care, Joint mobilization, Dry Needling, Electrical stimulation, Spinal mobilization, Cryotherapy, Moist heat, Traction, and Manual therapy.  PLAN FOR NEXT SESSION: asses and progress   Raniah Karan,ANGIE, PTA 10/22/2022, 1:17 PM [place]. [place], [place], [address] Phone: [phone]   Fax:  [phone]  Patient Details  Name: Rosanne Wohlfarth MRN: [id number] Date of Birth: June 06, 1946 Referring Provider:  Loura Pardon, MD  Encounter Date: 10/22/2022   Laqueta Carina, PTA 10/22/2022, 1:17 PM  [place] [place], [place], [address] Phone: [phone]   Fax:  [place]. [place], [place], [address] Phone: [phone]   Fax:  [phone]  Patient Details  Name: Chanel Mcadams MRN: [id number] Date of Birth: July 04, 1946 Referring Provider:  Loura Pardon, MD  Encounter Date: 10/22/2022   Laqueta Carina, PTA 10/22/2022, 1:17 PM  [place]. [place], [place], [address] Phone: [phone]   Fax:  [phone] [facility]  Patient Name: Elias Bordner MRN: [id number] DOB:June 21, 1946, 76 y.o., female Today's Date: 10/22/2022   PT End of Session - 10/22/22 1317     Visit Number 8    Number of Visits 13    Date for PT Re-Evaluation 11/10/22    PT Start Time 3709    PT Stop Time 1400    PT Time Calculation (min) 45 min             Past Medical History:  Diagnosis Date   Arthritis    Hypercholesterolemia    Osteoporosis    Past Surgical History:  Procedure Laterality Date   CESAREAN SECTION     TOTAL KNEE ARTHROPLASTY Right 01/20/2019   Procedure: TOTAL KNEE ARTHROPLASTY;  Surgeon: Dorna Leitz, MD;  Location: [facility];  Service: Orthopedics;  Laterality: Right;   Patient Active Problem List   Diagnosis Date Noted   Primary osteoarthritis of right knee 01/20/2019    PCP: Kathyrn Lass  REFERRING PROVIDER: Lucius Conn  REFERRING DIAG: LBP, sciatica  Rationale for Evaluation and Treatment Rehabilitation  THERAPY DIAG:  Other low back pain  Muscle spasm of back  ONSET DATE: August 2023  SUBJECTIVE:  SUBJECTIVE STATEMENT: Better than I was and watching the way I do things PERTINENT HISTORY:  above  PAIN:  Are you having pain? Yes LB 2/10   PRECAUTIONS: None  WEIGHT BEARING RESTRICTIONS No  FALLS:  Has patient fallen in last 6 months? No  LIVING ENVIRONMENT: Lives with: lives with their family Lives in: House/apartment Stairs: No Has following equipment at home: None  OCCUPATION: retired  PLOF: Independent and cook and clean  PATIENT GOALS less pain, walk and travel again   OBJECTIVE:   DIAGNOSTIC FINDINGS:  Xrays show scoliosis  PATIENT SURVEYS:  FOTO 43  COGNITION:  Overall cognitive status: Within functional limits for tasks  assessed     SENSATION: WFL  MUSCLE LENGTH: Hamstrings: Right 50 deg; Left 50 deg  POSTURE: rounded shoulders and forward head  PALPATION: She is tight in the lumbar and buttocks, tender in the buttocks  LUMBAR ROM:   Active  A/PROM  eval  Flexion Decreased 50%  Extension 50%  Right lateral flexion 50%  Left lateral flexion 50%  Right rotation   Left rotation    (Blank rows = not tested)  LOWER EXTREMITY ROM:     Active  Right eval Left eval  Hip flexion    Hip extension    Hip abduction    Hip adduction    Hip internal rotation    Hip external rotation    Knee flexion 100 100  Knee extension 10 10  Ankle dorsiflexion    Ankle plantarflexion    Ankle inversion    Ankle eversion     (Blank rows = not tested)  LOWER EXTREMITY MMT:   4-/5 with some low back pain    FUNCTIONAL TESTS:  Timed up and go (TUG): 22  GAIT: Distance walked: 50 feet Assistive device utilized: None Level of assistance: Complete Independence Comments: mild antalgic on the left    TODAY'S TREATMENT   09/26/22   Nustep L 5 43mn Black tband trunk flex and ext 20 x Seated row 15# 10 x 2 sets Lat pull 15# 10 x  Leg Press 20# 2 sets 10  Resisted gait 20# 4 x fwd and backward 3 x each side Wt ball trunk ext ( [facility] and [facility] 10 x each MH/IFC BIL LB supine 15 min    09/24/22  Educ of good BM for ADL with squating and reaching to avoid injury- get close to obj and use LE Sit fit 10 x 4 way Red tband scap stab on sit fit. LE strengthening with stab while on sit fit 15 x each Feet on ball bridge,KTC and obl 15 x Isometric abdominal press 15 x PROM LE/trunk Nustep L 4 6 min Black tband trunk flex and ext IFC/MH Left LB 15 min supine   09/19/22  Sit fit 10 x 4 way Red tband scap stab on sit fit Feet on ball bridge,KTC and obl  Isometric abdominal press Hooklying clams and hip flex green tband PROM LE Nustep L 4 6 min Seated row 15# 10 x Lat pull 15#  10 # Black tband trunk ext 2 sets 10 STS with wt ball chest pess 10 x    09/17/22 Pelvic tilts Feet on ball K2C, trunk rotation, small bridges and isometric abs SLR's supine with core activation 2x10 each LE stretches Hooklying green tband clamshells 2x10 MHP/IFC on the low back  09/10/22 MHP/IFC to the L/S area supine   PATIENT EDUCATION:  Education details: Flexion exercises and piriformis  stretches Person educated: Patient Education method: Explanation, Demonstration, Verbal cues, and Handouts Education comprehension: verbalized understanding   HOME EXERCISE PROGRAM: See above  ASSESSMENT:  CLINICAL IMPRESSION:  progressed strengthening- leg press needed to be > 90 knee flexion, resisted gait needed cuing and limited trunke xt with wt ball ex. LTG #! met  OBJECTIVE IMPAIRMENTS Abnormal gait, decreased activity tolerance, decreased mobility, difficulty walking, decreased ROM, decreased strength, increased fascial restrictions, increased muscle spasms, impaired flexibility, improper body mechanics, postural dysfunction, and pain.   REHAB POTENTIAL: Good  CLINICAL DECISION MAKING: Stable/uncomplicated  EVALUATION COMPLEXITY: Low   GOALS: Goals reviewed with patient? Yes  SHORT TERM GOALS: Target date: 09/24/22  Independent with initial HEP Goal status: met 09/19/22  LONG TERM GOALS: Target date: 01/14/2023  Understand posture and body mechanics Goal status: 09/26/22 MET  2.  Decrease pain 50% Goal status: INITIAL  3.  Increase lumbar ROM 25% Goal status: INITIAL  4.  Walk 30 minutes for exercise Goal status: INITIAL  5.  Tolerate sitting >30 minutes Goal status: INITIAL  PLAN: PT FREQUENCY: 1-2x/week  PT DURATION: 12 weeks  PLANNED INTERVENTIONS: Therapeutic exercises, Therapeutic activity, Neuromuscular re-education, Balance training, Gait training, Patient/Family education, Self Care, Joint mobilization, Dry Needling, Electrical stimulation,  Spinal mobilization, Cryotherapy, Moist heat, Traction, and Manual therapy.  PLAN FOR NEXT SESSION:progress core and LE strength   Dejha King,ANGIE, PTA 10/22/2022, 1:17 PM [place]. [place], [place], [address] Phone: [phone]   Fax:  [phone]  Patient Details  Name: Delainie Chavana MRN: [id number] Date of Birth: 11-05-1946 Referring Provider:  Loura Pardon, MD  Encounter Date: 10/22/2022   Laqueta Carina, PTA 10/22/2022, 1:17 PM  [place]. [place], [place], [address] Phone: [phone]   Fax:  [place]. [place], [place], [address] Phone: [phone]   Fax:  [phone]  Patient Details  Name: Laiyla Slagel MRN: [id number] Date of Birth: 09/05/1946 Referring Provider:  Loura Pardon, MD  Encounter Date: 10/22/2022   Laqueta Carina, PTA 10/22/2022, 1:17 PM  [place]. [place], [place], [address] Phone: [phone]   Fax:  [place]. [place], [place], [address] Phone: [phone]   Fax:  [phone]  Patient Details  Name: Margia Wiesen MRN: [id number] Date of Birth: 10/07/1946 Referring Provider:  Loura Pardon, MD  Encounter Date: 10/22/2022   Laqueta Carina, PTA 10/22/2022, 1:17 PM  [place]. [place], [place], [address] Phone: [phone]   Fax:  [place]. [place], [place], [address] Phone: [phone]   Fax:  [phone]  Patient Details  Name: Mareli Antunes MRN: [id number] Date of Birth: [birth year] Referring Provider:  Loura Pardon, MD  Encounter Date:  10/22/2022   Laqueta Carina, PTA 10/22/2022, 1:17 PM  [place]. [place], [place], [address] Phone: [phone]   Fax:  [place]. [place], [place], [address] Phone: [phone]   Fax:  [phone]  Patient Details  Name: Tenise Stetler MRN: [id number] Date of Birth: Jul 13, 1946 Referring Provider:  Loura Pardon, MD  Encounter Date: 10/22/2022   Laqueta Carina, PTA 10/22/2022, 1:17 PM  [place]  [address] [place], [place], [address] Phone: [phone]   Fax:  [place]. [place], [place], [address] Phone: [phone]   Fax:  [phone]  Patient Details  Name: Kiernan Farkas MRN: [id number] Date of Birth: November 22, 1946 Referring Provider:  Loura Pardon, MD  Encounter Date: 10/22/2022   Laqueta Carina, PTA 10/22/2022, 1:17 PM  [place]. [place], [place], [address] Phone: [phone]   Fax:  [phone]

## 2022-10-30 ENCOUNTER — Encounter: Payer: Self-pay | Admitting: Physical Therapy

## 2022-10-30 ENCOUNTER — Ambulatory Visit: Payer: Medicare PPO | Admitting: Physical Therapy

## 2022-10-30 DIAGNOSIS — M62830 Muscle spasm of back: Secondary | ICD-10-CM

## 2022-10-30 DIAGNOSIS — M5459 Other low back pain: Secondary | ICD-10-CM | POA: Diagnosis not present

## 2022-10-30 DIAGNOSIS — M5416 Radiculopathy, lumbar region: Secondary | ICD-10-CM

## 2022-10-30 NOTE — Therapy (Signed)
OUTPATIENT PHYSICAL THERAPY THORACOLUMBAR TREATMENT  Patient Name: Robin Hutchinson MRN: [id number] DOB:09-20-1946, 76 y.o., female Today's Date: 10/30/2022   PT End of Session - 10/30/22 1427     Visit Number 9    Date for PT Re-Evaluation 11/10/22    PT Start Time 1430    PT Stop Time 1515    PT Time Calculation (min) 45 min    Activity Tolerance Patient tolerated treatment well    Behavior During Therapy WFL for tasks assessed/performed             Past Medical History:  Diagnosis Date   Arthritis    Hypercholesterolemia    Osteoporosis    Past Surgical History:  Procedure Laterality Date   CESAREAN SECTION     TOTAL KNEE ARTHROPLASTY Right 01/20/2019   Procedure: TOTAL KNEE ARTHROPLASTY;  Surgeon: Dorna Leitz, MD;  Location: [facility];  Service: Orthopedics;  Laterality: Right;   Patient Active Problem List   Diagnosis Date Noted   Primary osteoarthritis of right knee 01/20/2019    PCP: Kathyrn Lass  REFERRING PROVIDER: Lucius Conn  REFERRING DIAG: LBP, sciatica  Rationale for Evaluation and Treatment Rehabilitation  THERAPY DIAG:  Other low back pain  Radiculopathy, lumbar region  Muscle spasm of back  ONSET DATE: August 2023  SUBJECTIVE:                                                                                                                                                                                           SUBJECTIVE STATEMENT: Pt arrives stating mild soremess form HEP but they are good. Shld is painful, hip is sore. Saw MD and they are ordering lubrication inj for  rknee   PERTINENT HISTORY:  above  PAIN:  Are you having pain? Yes left hip 2/10, left shld 6/10   PRECAUTIONS: None  WEIGHT BEARING RESTRICTIONS No  FALLS:  Has patient fallen in last 6 months? No  LIVING ENVIRONMENT: Lives with: lives with their family Lives in: House/apartment Stairs: No Has following equipment at home: None  OCCUPATION: retired  PLOF:  Independent and cook and clean  PATIENT GOALS less pain, walk and travel again   OBJECTIVE:   DIAGNOSTIC FINDINGS:  Xrays show scoliosis  PATIENT SURVEYS:  FOTO 77  COGNITION:  Overall cognitive status: Within functional limits for tasks assessed     SENSATION: WFL  MUSCLE LENGTH: Hamstrings: Right 50 deg; Left 50 deg  POSTURE: rounded shoulders and forward head  PALPATION: She is tight in the lumbar and buttocks, tender in the buttocks  LUMBAR ROM:   Active  A/PROM  eval 10/15/22  Flexion Decreased  50% WFLs  Extension 50% Decreased 50%  Right lateral flexion 50% WFLS  Left lateral flexion 50% WFLs  Right rotation    Left rotation     (Blank rows = not tested)  LOWER EXTREMITY ROM:     Active  Right eval Left eval  Hip flexion    Hip extension    Hip abduction    Hip adduction    Hip internal rotation    Hip external rotation    Knee flexion 100 100  Knee extension 10 10  Ankle dorsiflexion    Ankle plantarflexion    Ankle inversion    Ankle eversion     (Blank rows = not tested)  LOWER EXTREMITY MMT:   4-/5 with some low back pain    FUNCTIONAL TESTS:  Timed up and go (TUG): 22  GAIT: Distance walked: 50 feet Assistive device utilized: None Level of assistance: Complete Independence Comments: mild antalgic on the left    TODAY'S TREATMENT   10/22/22 Bike L4 5 min UBE 2 min wd/2 min backward Lat pull 20# 2 sets 10 Seated row 20# 2 sets 10 Black tband trunk flex/ext  Resisted gait 4 way 4 x each 30# STS wt ball chest press 10 x Wt ball trunk ext and rotation 10 x each Premod left shld/left LB with MH supine   10/17/22 Nustep L 5 6 min [id number] HEP red tband scap stab and LE 3 way Trunk flex and ext black tband Seated row 20# 2 sets 10 Knee ext 2 sets 10  10# HS curl 2 sets 10  20# STS with wt ball press 10 x IFC and MH LEft LB and hip with MH for shld 15 min   10/15/22 Nustep L 4 71mn 20# resisted gait 5 x fwd and 3 x  each side STS with wt ball chest press 10 x Trunk flex and ext black tband Seated row 20# 2 sets 10 Red tband BIL hip 3 way 10 x each IFC and MH LEft LB and hip with MH for shld 15 min  09/24/22  Educ of good BM for ADL with squating and reaching to avoid injury- get close to obj and use LE Sit fit 10 x 4 way Red tband scap stab on sit fit. LE strengthening with stab while on sit fit 15 x each Feet on ball bridge,KTC and obl 15 x Isometric abdominal press 15 x PROM LE/trunk Nustep L 4 6 min Black tband trunk flex and ext IFC/MH Left LB 15 min supine   09/19/22  Sit fit 10 x 4 way Red tband scap stab on sit fit Feet on ball bridge,KTC and obl  Isometric abdominal press Hooklying clams and hip flex green tband PROM LE Nustep L 4 6 min Seated row 15# 10 x Lat pull 15# 10 # Black tband trunk ext 2 sets 10 STS with wt ball chest pess 10 x    09/17/22 Pelvic tilts Feet on ball K2C, trunk rotation, small bridges and isometric abs SLR's supine with core activation 2x10 each LE stretches Hooklying green tband clamshells 2x10 MHP/IFC on the low back  09/10/22 MHP/IFC to the L/S area supine   PATIENT EDUCATION:  Education details: Flexion exercises and piriformis stretches Person educated: Patient Education method: EConsulting civil engineer DMedia planner Verbal cues, and Handouts Education comprehension: verbalized understanding   HOME EXERCISE PROGRAM: See above  ASSESSMENT:  CLINICAL IMPRESSION: progressed ex with postural cuing needed. Educ on BM for ADLS to decrease strain on shld/back and  LE and added stim to left shld as well d/t that was her most pain today OBJECTIVE IMPAIRMENTS Abnormal gait, decreased activity tolerance, decreased mobility, difficulty walking, decreased ROM, decreased strength, increased fascial restrictions, increased muscle spasms, impaired flexibility, improper body mechanics, postural dysfunction, and pain.   REHAB POTENTIAL: Good  CLINICAL  DECISION MAKING: Stable/uncomplicated  EVALUATION COMPLEXITY: Low   GOALS: Goals reviewed with patient? Yes  SHORT TERM GOALS: Target date: 09/24/22  Independent with initial HEP Goal status: met 09/19/22  LONG TERM GOALS: Target date: 01/22/2023  Understand posture and body mechanics Goal status: progressing 10/22/22  2.  Decrease pain 50% Goal status: progressing 10/15/22  3.  Increase lumbar ROM 25% Goal status: 10/15/22 progressing  4.  Walk 30 minutes for exercise Goal status: 10/15/22 20-30 min  5.  Tolerate sitting >30 minutes Goal status: 10/15/22 MET  PLAN: PT FREQUENCY: 1-2x/week  PT DURATION: 12 weeks  PLANNED INTERVENTIONS: Therapeutic exercises, Therapeutic activity, Neuromuscular re-education, Balance training, Gait training, Patient/Family education, Self Care, Joint mobilization, Dry Needling, Electrical stimulation, Spinal mobilization, Cryotherapy, Moist heat, Traction, and Manual therapy.  PLAN FOR NEXT SESSION: asses and progress   Scot Jun, PTA 10/30/2022, 2:34 PM [place]. [place], [place], [address] Phone: [phone]   Fax:  [phone]  Patient Details  Name: Jaqulyn Chancellor MRN: [id number] Date of Birth: [birth year] Referring Provider:  Loura Pardon, MD  Encounter Date: 10/30/2022   Scot Jun, PTA 10/30/2022, 2:34 PM  [place]. [place], [place], [address] Phone: [phone]   Fax:  [place]. [place], [place], [address] Phone: [phone]   Fax:  [phone]  Patient Details  Name: Efrat Zuidema MRN: [id number] Date of Birth: [birth year] Referring Provider:  Loura Pardon, MD  Encounter Date: 10/30/2022   Scot Jun, PTA 10/30/2022, 2:34 PM  [place]. [place], [place], [address] Phone: [phone]   Fax:  [phone] [facility]  Patient Name: Robin Hutchinson MRN: [id number] DOB:[birth year], 76 y.o., female Today's Date: 10/30/2022   PT End of Session - 10/30/22 1427     Visit Number 9    Date for PT Re-Evaluation 11/10/22    PT Start Time 1430    PT Stop Time 1515    PT Time Calculation (min) 45 min    Activity Tolerance Patient tolerated treatment well    Behavior During Therapy WFL for tasks assessed/performed             Past Medical History:  Diagnosis Date   Arthritis    Hypercholesterolemia    Osteoporosis    Past Surgical History:  Procedure Laterality Date   CESAREAN SECTION     TOTAL KNEE ARTHROPLASTY Right 01/20/2019   Procedure: TOTAL KNEE ARTHROPLASTY;  Surgeon: Dorna Leitz, MD;  Location: [facility];  Service: Orthopedics;  Laterality: Right;   Patient Active Problem List   Diagnosis Date Noted   Primary osteoarthritis of right knee 01/20/2019    PCP: Kathyrn Lass  REFERRING PROVIDER: Lucius Conn  REFERRING DIAG: LBP, sciatica  Rationale for Evaluation and Treatment Rehabilitation  THERAPY DIAG:  Other low back pain  Radiculopathy, lumbar region  Muscle spasm of back  ONSET DATE: August 2023  SUBJECTIVE:  SUBJECTIVE STATEMENT: "So far so good"  PERTINENT HISTORY:  above  PAIN:  Are you having pain? Yes LB 2/10   PRECAUTIONS: None  WEIGHT BEARING RESTRICTIONS No  FALLS:  Has patient fallen in last 6 months? No  LIVING ENVIRONMENT: Lives with: lives with their family Lives in: House/apartment Stairs: No Has following equipment at home: None  OCCUPATION: retired  PLOF: Independent and cook and clean  PATIENT GOALS less pain,  walk and travel again   OBJECTIVE:   DIAGNOSTIC FINDINGS:  Xrays show scoliosis  PATIENT SURVEYS:  FOTO 43  MUSCLE LENGTH: Hamstrings: Right 50 deg; Left 50 deg  POSTURE: rounded shoulders and forward head  PALPATION: She is tight in the lumbar and buttocks, tender in the buttocks  LUMBAR ROM:   Active  A/PROM  eval  Flexion Decreased 50%  Extension 50%  Right lateral flexion 50%  Left lateral flexion 50%  Right rotation   Left rotation    (Blank rows = not tested)  LOWER EXTREMITY ROM:     Active  Right eval Left eval  Hip flexion    Hip extension    Hip abduction    Hip adduction    Hip internal rotation    Hip external rotation    Knee flexion 100 100  Knee extension 10 10  Ankle dorsiflexion    Ankle plantarflexion    Ankle inversion    Ankle eversion     (Blank rows = not tested)  LOWER EXTREMITY MMT:   4-/5 with some low back pain    FUNCTIONAL TESTS:  Timed up and go (TUG): 22  GAIT: Distance walked: 50 feet Assistive device utilized: None Level of assistance: Complete Independence Comments: mild antalgic on the left    TODAY'S TREATMENT  10/30/22 NuStep L5 x 6 min Seated row 15# 10 x 2 sets Lat pull 15# 10 x  2 sets  Black tband trunk ext 2x10 Shoulder Ext red 2x10 Leg press 20lb 2x10 Seated ab sets w/ ball Seated trunk rotations w/ yellow ball 2x10  Pre-Mod MHP L shoulder x11 min   09/26/22   Nustep L 5 59mn Black tband trunk flex and ext 20 x Seated row 15# 10 x 2 sets Lat pull 15# 10 x  Leg Press 20# 2 sets 10  Resisted gait 20# 4 x fwd and backward 3 x each side Wt ball trunk ext ( [facility] and rotation 10 x each MH/IFC BIL LB supine 15 min    09/24/22  Educ of good BM for ADL with squating and reaching to avoid injury- get close to obj and use LE Sit fit 10 x 4 way Red tband scap stab on sit fit. LE strengthening with stab while on sit fit 15 x each Feet on ball bridge,KTC and obl 15 x Isometric  abdominal press 15 x PROM LE/trunk Nustep L 4 6 min Black tband trunk flex and ext IFC/MH Left LB 15 min supine   09/19/22  Sit fit 10 x 4 way Red tband scap stab on sit fit Feet on ball bridge,KTC and obl  Isometric abdominal press Hooklying clams and hip flex green tband PROM LE Nustep L 4 6 min Seated row 15# 10 x Lat pull 15# 10 # Black tband trunk ext 2 sets 10 STS with wt ball chest pess 10 x    09/17/22 Pelvic tilts Feet on ball K2C, trunk rotation, small bridges and isometric abs SLR's supine with core activation 2x10 each  LE stretches Hooklying green tband clamshells 2x10 MHP/IFC on the low back  09/10/22 MHP/IFC to the L/S area supine   PATIENT EDUCATION:  Education details: Flexion exercises and piriformis stretches Person educated: Patient Education method: Consulting civil engineer, Demonstration, Verbal cues, and Handouts Education comprehension: verbalized understanding   HOME EXERCISE PROGRAM: See above  ASSESSMENT:  CLINICAL IMPRESSION:  Pt enters with reports of L shoulder soreness. Continued with a progression of postural and core strength. Postural cues needed with seted rows nd standing shoulder extensions. Modalities to aid with shoulder discomfort  OBJECTIVE IMPAIRMENTS Abnormal gait, decreased activity tolerance, decreased mobility, difficulty walking, decreased ROM, decreased strength, increased fascial restrictions, increased muscle spasms, impaired flexibility, improper body mechanics, postural dysfunction, and pain.   REHAB POTENTIAL: Good  CLINICAL DECISION MAKING: Stable/uncomplicated  EVALUATION COMPLEXITY: Low   GOALS: Goals reviewed with patient? Yes  SHORT TERM GOALS: Target date: 09/24/22  Independent with initial HEP Goal status: met 09/19/22  LONG TERM GOALS: Target date: 01/22/2023  Understand posture and body mechanics Goal status: 09/26/22 MET  2.  Decrease pain 50% Goal status: INITIAL  3.  Increase lumbar ROM  25% Goal status: INITIAL  4.  Walk 30 minutes for exercise Goal status: INITIAL  5.  Tolerate sitting >30 minutes Goal status: INITIAL  PLAN: PT FREQUENCY: 1-2x/week  PT DURATION: 12 weeks  PLANNED INTERVENTIONS: Therapeutic exercises, Therapeutic activity, Neuromuscular re-education, Balance training, Gait training, Patient/Family education, Self Care, Joint mobilization, Dry Needling, Electrical stimulation, Spinal mobilization, Cryotherapy, Moist heat, Traction, and Manual therapy.  PLAN FOR NEXT SESSION:progress core and LE strength   Scot Jun, PTA 10/30/2022, 2:34 PM [place]. [place], [place], [address] Phone: [phone]   Fax:  [phone]  Patient Details  Name: Robin Hutchinson MRN: [id number] Date of Birth: [birth year] Referring Provider:  Loura Pardon, MD  Encounter Date: 10/30/2022   Scot Jun, PTA 10/30/2022, 2:34 PM  [place]. [place], [place], [address] Phone: [phone]   Fax:  [place]. [place], [place], [address] Phone: [phone]   Fax:  [phone]  Patient Details  Name: Robin Hutchinson MRN: [id number] Date of Birth: 12/27/1945 Referring Provider:  Loura Pardon, MD  Encounter Date: 10/30/2022   Scot Jun, PTA 10/30/2022, 2:34 PM  [place]. [place], [place], [address] Phone: [phone]   Fax:  [place]. [place], [place], [address] Phone: [phone]   Fax:  [phone]  Patient Details  Name: Robin Hutchinson MRN: [id number] Date of Birth: January 26, 1946 Referring Provider:  Loura Pardon, MD  Encounter Date: 10/30/2022   Scot Jun,  PTA 10/30/2022, 2:34 PM  [place]. [place], [place], [address] Phone: [phone]   Fax:  [place]. [place], [place], [address] Phone: [phone]   Fax:  [phone]  Patient Details  Name: Robin Hutchinson MRN: [id number] Date of Birth: 23-Aug-1946 Referring Provider:  Loura Pardon, MD  Encounter Date: 10/30/2022   Scot Jun, PTA 10/30/2022, 2:34 PM  [place]. [place], [place], [address] Phone: [phone]   Fax:  [place]. [place], [place], [address]  Phone: [phone]   Fax:  [phone]  Patient Details  Name: Robin Hutchinson MRN: [id number] Date of Birth: Apr 06, 1946 Referring Provider:  Loura Pardon, MD  Encounter Date: 10/30/2022   Scot Jun, PTA 10/30/2022, 2:34 PM  [place]. [place], [place], [address] Phone: [phone]   Fax:  [place]. [place], [place], [address] Phone: [phone]   Fax:  [phone]  Patient Details  Name: Robin Hutchinson MRN: [id number] Date of Birth: May 25, 1946 Referring Provider:  Loura Pardon, MD  Encounter Date: 10/30/2022   Scot Jun, PTA 10/30/2022, 2:34 PM  [place]. [place], [place], [address] Phone: [phone]   Fax:  [phone]

## 2022-11-01 ENCOUNTER — Ambulatory Visit: Payer: Medicare PPO | Attending: Family Medicine | Admitting: Physical Therapy

## 2022-11-01 DIAGNOSIS — M5459 Other low back pain: Secondary | ICD-10-CM | POA: Diagnosis present

## 2022-11-01 DIAGNOSIS — M62830 Muscle spasm of back: Secondary | ICD-10-CM | POA: Diagnosis present

## 2022-11-01 NOTE — Therapy (Signed)
OUTPATIENT PHYSICAL THERAPY THORACOLUMBAR TREATMENT  Progress Note Reporting Period 09/10/22 to 11/01/22  See note below for Objective Data and Assessment of Progress/Goals.    Patient Name: Robin Hutchinson MRN: [id number] DOB:May 06, 1946, 76 y.o., female Today's Date: 11/01/2022   PT End of Session - 11/01/22 1012     Visit Number 10    Number of Visits 13    Date for PT Re-Evaluation 11/10/22    PT Start Time 1010    PT Stop Time 1105    PT Time Calculation (min) 55 min             Past Medical History:  Diagnosis Date   Arthritis    Hypercholesterolemia    Osteoporosis    Past Surgical History:  Procedure Laterality Date   CESAREAN SECTION     TOTAL KNEE ARTHROPLASTY Right 01/20/2019   Procedure: TOTAL KNEE ARTHROPLASTY;  Surgeon: Dorna Leitz, MD;  Location: [facility];  Service: Orthopedics;  Laterality: Right;   Patient Active Problem List   Diagnosis Date Noted   Primary osteoarthritis of right knee 01/20/2019    PCP: Kathyrn Lass  REFERRING PROVIDER: Lucius Conn  REFERRING DIAG: LBP, sciatica  Rationale for Evaluation and Treatment Rehabilitation  THERAPY DIAG:  Other low back pain  Muscle spasm of back  ONSET DATE: August 2023  SUBJECTIVE:                                                                                                                                                                                           SUBJECTIVE STATEMENT: Pt states back is getting better- just left hip soreness, states left shld pain less. Verb approved for gel inj but waiting until 12/18 to start after done with PT PERTINENT HISTORY:  above  PAIN:  Are you having pain? Yes left hip 1/10, left shld 4/10   PRECAUTIONS: None  WEIGHT BEARING RESTRICTIONS No  FALLS:  Has patient fallen in last 6 months? No  LIVING ENVIRONMENT: Lives with: lives with their family Lives in: House/apartment Stairs: No Has following equipment at home: None  OCCUPATION:  retired  PLOF: Independent and cook and clean  PATIENT GOALS less pain, walk and travel again   OBJECTIVE:   DIAGNOSTIC FINDINGS:  Xrays show scoliosis  PATIENT SURVEYS:  FOTO 11  COGNITION:  Overall cognitive status: Within functional limits for tasks assessed     SENSATION: WFL  MUSCLE LENGTH: Hamstrings: Right 50 deg; Left 50 deg  POSTURE: rounded shoulders and forward head  PALPATION: She is tight in the lumbar and buttocks, tender in the buttocks  LUMBAR ROM:   Active  A/PROM  eval 10/15/22 11/01/22  Flexion Decreased 50% WFLs WFLS  Extension 50% Decreased 50% Decreased 30%  Right lateral flexion 50% WFLS WFLs  Left lateral flexion 50% WFLs WFLs  Right rotation     Left rotation      (Blank rows = not tested)  LOWER EXTREMITY ROM:     Active  Right eval Left eval  Hip flexion    Hip extension    Hip abduction    Hip adduction    Hip internal rotation    Hip external rotation    Knee flexion 100 100  Knee extension 10 10  Ankle dorsiflexion    Ankle plantarflexion    Ankle inversion    Ankle eversion     (Blank rows = not tested)  LOWER EXTREMITY MMT:   4-/5 with some low back pain    FUNCTIONAL TESTS:  Timed up and go (TUG): 22  GAIT: Distance walked: 50 feet Assistive device utilized: None Level of assistance: Complete Independence Comments: mild antalgic on the left    TODAY'S TREATMENT   11/01/22 Nustep L 5 7 min Lat pull 20# 2 sets 10 Seated row 20# 2 sets 10 Black tband trunk flex/ext  Resisted gait 4 way 4 x each 30# STS wt ball chest press 10 x Wt ball trunk ext and rotation 10 x each 4 inch step up with opp hip ext with UE support 10 each and then laterally with opp leg abd UBE 2 min fwd/ 2 min back Premod left shld/left LB with MH supine   10/22/22 Bike L4 5 min UBE 2 min wd/2 min backward Lat pull 20# 2 sets 10 Seated row 20# 2 sets 10 Black tband trunk flex/ext  Resisted gait 4 way 4 x each 30# STS wt ball  chest press 10 x Wt ball trunk ext and rotation 10 x each Premod left shld/left LB with MH supine   10/17/22 Nustep L 5 6 min [id number] HEP red tband scap stab and LE 3 way Trunk flex and ext black tband Seated row 20# 2 sets 10 Knee ext 2 sets 10  10# HS curl 2 sets 10  20# STS with wt ball press 10 x IFC and MH LEft LB and hip with MH for shld 15 min   10/15/22 Nustep L 4 53mn 20# resisted gait 5 x fwd and 3 x each side STS with wt ball chest press 10 x Trunk flex and ext black tband Seated row 20# 2 sets 10 Red tband BIL hip 3 way 10 x each IFC and MH LEft LB and hip with MH for shld 15 min  09/24/22  Educ of good BM for ADL with squating and reaching to avoid injury- get close to obj and use LE Sit fit 10 x 4 way Red tband scap stab on sit fit. LE strengthening with stab while on sit fit 15 x each Feet on ball bridge,KTC and obl 15 x Isometric abdominal press 15 x PROM LE/trunk Nustep L 4 6 min Black tband trunk flex and ext IFC/MH Left LB 15 min supine   09/19/22  Sit fit 10 x 4 way Red tband scap stab on sit fit Feet on ball bridge,KTC and obl  Isometric abdominal press Hooklying clams and hip flex green tband PROM LE Nustep L 4 6 min Seated row 15# 10 x Lat pull 15# 10 # Black tband trunk ext 2 sets 10 STS with wt ball chest pess 10 x    09/17/22  Pelvic tilts Feet on ball K2C, trunk rotation, small bridges and isometric abs SLR's supine with core activation 2x10 each LE stretches Hooklying green tband clamshells 2x10 MHP/IFC on the low back  09/10/22 MHP/IFC to the L/S area supine   PATIENT EDUCATION:  Education details: Flexion exercises and piriformis stretches Person educated: Patient Education method: Consulting civil engineer, Media planner, Verbal cues, and Handouts Education comprehension: verbalized understanding   HOME EXERCISE PROGRAM: See above  ASSESSMENT:  CLINICAL IMPRESSION: progressed ex with postural cuing needed. Estim at pt  request as she states it helps. Progressing and assess goals see below OBJECTIVE IMPAIRMENTS Abnormal gait, decreased activity tolerance, decreased mobility, difficulty walking, decreased ROM, decreased strength, increased fascial restrictions, increased muscle spasms, impaired flexibility, improper body mechanics, postural dysfunction, and pain.   REHAB POTENTIAL: Good  CLINICAL DECISION MAKING: Stable/uncomplicated  EVALUATION COMPLEXITY: Low   GOALS: Goals reviewed with patient? Yes  SHORT TERM GOALS: Target date: 09/24/22  Independent with initial HEP Goal status: met 09/19/22  LONG TERM GOALS: Target date: 01/24/2023  Understand posture and body mechanics Goal status: progressing 10/22/22 11/01/22 met  2.  Decrease pain 50% Goal status: progressing 10/15/22 Met 75% + 11/01/22  3.  Increase lumbar ROM 25% Goal status: 10/15/22 progressing 11/01/22 MET  4.  Walk 30 minutes for exercise Goal status: 10/15/22 20-30 min 11/01/22 progressing limitations mostly by knee  5.  Tolerate sitting >30 minutes Goal status: 10/15/22 MET  PLAN: PT FREQUENCY: 1-2x/week  PT DURATION: 12 weeks  PLANNED INTERVENTIONS: Therapeutic exercises, Therapeutic activity, Neuromuscular re-education, Balance training, Gait training, Patient/Family education, Self Care, Joint mobilization, Dry Needling, Electrical stimulation, Spinal mobilization, Cryotherapy, Moist heat, Traction, and Manual therapy.  PLAN FOR NEXT SESSION: plan to D/C at end of cert 72/25/75 assure HEP in place  Ethel Rana DPT 11/01/22 10:38 AM   Tamari Redwine,ANGIE, PTA 11/01/2022, 10:13 AM [place]. [place], [place], [address] Phone: [phone]   Fax:  [phone]  Patient Details  Name: Robin Hutchinson MRN: [id number] Date of Birth: 20-Feb-1946 Referring Provider:  Loura Pardon, MD  Encounter Date: 11/01/2022

## 2022-11-05 ENCOUNTER — Ambulatory Visit: Payer: Medicare PPO | Admitting: Physical Therapy

## 2022-11-05 DIAGNOSIS — M5459 Other low back pain: Secondary | ICD-10-CM | POA: Diagnosis not present

## 2022-11-05 DIAGNOSIS — M62830 Muscle spasm of back: Secondary | ICD-10-CM

## 2022-11-05 NOTE — Therapy (Signed)
OUTPATIENT PHYSICAL THERAPY THORACOLUMBAR TREATMENT  Progress Note Reporting Period 09/10/22 to 11/01/22  See note below for Objective Data and Assessment of Progress/Goals.    Patient Name: Robin Hutchinson MRN: [id number] DOB:12/22/1945, 76 y.o., female Today's Date: 11/05/2022   PT End of Session - 11/05/22 1442     Visit Number 11    Number of Visits 13    Date for PT Re-Evaluation 11/10/22    Authorization Type [employer]    PT Start Time 3818    PT Stop Time 1540    PT Time Calculation (min) 55 min             Past Medical History:  Diagnosis Date   Arthritis    Hypercholesterolemia    Osteoporosis    Past Surgical History:  Procedure Laterality Date   CESAREAN SECTION     TOTAL KNEE ARTHROPLASTY Right 01/20/2019   Procedure: TOTAL KNEE ARTHROPLASTY;  Surgeon: Dorna Leitz, MD;  Location: [facility];  Service: Orthopedics;  Laterality: Right;   Patient Active Problem List   Diagnosis Date Noted   Primary osteoarthritis of right knee 01/20/2019    PCP: Kathyrn Lass  REFERRING PROVIDER: Lucius Conn  REFERRING DIAG: LBP, sciatica  Rationale for Evaluation and Treatment Rehabilitation  THERAPY DIAG:  Other low back pain  Muscle spasm of back  ONSET DATE: August 2023  SUBJECTIVE:                                                                                                                                                                                           SUBJECTIVE STATEMENT: feeling better, shld still and issue. Will start knee inj when PT is done. Overall back/hip 90% better PERTINENT HISTORY:  above  PAIN:  Are you having pain? Yes left hip 1/10, left shld 4/10   PRECAUTIONS: None  WEIGHT BEARING RESTRICTIONS No  FALLS:  Has patient fallen in last 6 months? No  LIVING ENVIRONMENT: Lives with: lives with their family Lives in: House/apartment Stairs: No Has following equipment at home: None  OCCUPATION: retired  PLOF: Independent and cook  and clean  PATIENT GOALS less pain, walk and travel again   OBJECTIVE:   DIAGNOSTIC FINDINGS:  Xrays show scoliosis  PATIENT SURVEYS:  FOTO 94  COGNITION:  Overall cognitive status: Within functional limits for tasks assessed     SENSATION: WFL  MUSCLE LENGTH: Hamstrings: Right 50 deg; Left 50 deg  POSTURE: rounded shoulders and forward head  PALPATION: She is tight in the lumbar and buttocks, tender in the buttocks  LUMBAR ROM:   Active  A/PROM  eval 10/15/22 11/01/22  Flexion Decreased  50% WFLs WFLS  Extension 50% Decreased 50% Decreased 30%  Right lateral flexion 50% WFLS WFLs  Left lateral flexion 50% WFLs WFLs  Right rotation     Left rotation      (Blank rows = not tested)  LOWER EXTREMITY ROM:     Active  Right eval Left eval  Hip flexion    Hip extension    Hip abduction    Hip adduction    Hip internal rotation    Hip external rotation    Knee flexion 100 100  Knee extension 10 10  Ankle dorsiflexion    Ankle plantarflexion    Ankle inversion    Ankle eversion     (Blank rows = not tested)  LOWER EXTREMITY MMT:   4-/5 with some low back pain    FUNCTIONAL TESTS:  Timed up and go (TUG): 22  GAIT: Distance walked: 50 feet Assistive device utilized: None Level of assistance: Complete Independence Comments: mild antalgic on the left    TODAY'S TREATMENT   11/05/22 Nustep L 6 6 min 10# cable pulleys shld ext 2 sets 10, and row 2 sets 10 with cuing for core engagement 10# AR press 10 x each side STS with wt ball OH press 10 x Step up opp ext ext 10 x the latral step up with opp leg abd 10 x . Cuing and UE support UBE L 4 2 min fwd/2 min backward Lat pull 20# 2 sets 10 Seated row 20# 2 sets 10 Black tband trunk flex/ext  Premod estim and MH     11/01/22 Nustep L 5 7 min Lat pull 20# 2 sets 10 Seated row 20# 2 sets 10 Black tband trunk flex/ext  Resisted gait 4 way 4 x each 30# STS wt ball chest press 10 x Wt ball trunk  ext and rotation 10 x each 4 inch step up with opp hip ext with UE support 10 each and then laterally with opp leg abd UBE 2 min fwd/ 2 min back Premod left shld/left LB with MH supine   10/22/22 Bike L4 5 min UBE 2 min wd/2 min backward Lat pull 20# 2 sets 10 Seated row 20# 2 sets 10 Black tband trunk flex/ext  Resisted gait 4 way 4 x each 30# STS wt ball chest press 10 x Wt ball trunk ext and rotation 10 x each Premod left shld/left LB with MH supine   10/17/22 Nustep L 5 6 min [id number] HEP red tband scap stab and LE 3 way Trunk flex and ext black tband Seated row 20# 2 sets 10 Knee ext 2 sets 10  10# HS curl 2 sets 10  20# STS with wt ball press 10 x IFC and MH LEft LB and hip with MH for shld 15 min   10/15/22 Nustep L 4 49mn 20# resisted gait 5 x fwd and 3 x each side STS with wt ball chest press 10 x Trunk flex and ext black tband Seated row 20# 2 sets 10 Red tband BIL hip 3 way 10 x each IFC and MH LEft LB and hip with MH for shld 15 min  09/24/22  Educ of good BM for ADL with squating and reaching to avoid injury- get close to obj and use LE Sit fit 10 x 4 way Red tband scap stab on sit fit. LE strengthening with stab while on sit fit 15 x each Feet on ball bridge,KTC and obl 15 x Isometric abdominal press 15 x PROM  LE/trunk Nustep L 4 6 min Black tband trunk flex and ext IFC/MH Left LB 15 min supine   09/19/22  Sit fit 10 x 4 way Red tband scap stab on sit fit Feet on ball bridge,KTC and obl  Isometric abdominal press Hooklying clams and hip flex green tband PROM LE Nustep L 4 6 min Seated row 15# 10 x Lat pull 15# 10 # Black tband trunk ext 2 sets 10 STS with wt ball chest pess 10 x    09/17/22 Pelvic tilts Feet on ball K2C, trunk rotation, small bridges and isometric abs SLR's supine with core activation 2x10 each LE stretches Hooklying green tband clamshells 2x10 MHP/IFC on the low back  09/10/22 MHP/IFC to the L/S area  supine   PATIENT EDUCATION:  Education details: Flexion exercises and piriformis stretches Person educated: Patient Education method: Consulting civil engineer, Media planner, Verbal cues, and Handouts Education comprehension: verbalized understanding   HOME EXERCISE PROGRAM: See above  ASSESSMENT:  CLINICAL IMPRESSION: progressed strenthening with cuing to engage core. Adjusted tx for end of cert  OBJECTIVE IMPAIRMENTS Abnormal gait, decreased activity tolerance, decreased mobility, difficulty walking, decreased ROM, decreased strength, increased fascial restrictions, increased muscle spasms, impaired flexibility, improper body mechanics, postural dysfunction, and pain.   REHAB POTENTIAL: Good  CLINICAL DECISION MAKING: Stable/uncomplicated  EVALUATION COMPLEXITY: Low   GOALS: Goals reviewed with patient? Yes  SHORT TERM GOALS: Target date: 09/24/22  Independent with initial HEP Goal status: met 09/19/22  LONG TERM GOALS: Target date: 01/28/2023  Understand posture and body mechanics Goal status: progressing 10/22/22 11/01/22 met  2.  Decrease pain 50% Goal status: progressing 10/15/22 Met 75% + 11/01/22  3.  Increase lumbar ROM 25% Goal status: 10/15/22 progressing 11/01/22 MET  4.  Walk 30 minutes for exercise Goal status: 10/15/22 20-30 min 11/01/22 progressing limitations mostly by knee  5.  Tolerate sitting >30 minutes Goal status: 10/15/22 MET  PLAN: PT FREQUENCY: 1-2x/week  PT DURATION: 12 weeks  PLANNED INTERVENTIONS: Therapeutic exercises, Therapeutic activity, Neuromuscular re-education, Balance training, Gait training, Patient/Family education, Self Care, Joint mobilization, Dry Needling, Electrical stimulation, Spinal mobilization, Cryotherapy, Moist heat, Traction, and Manual therapy.  PLAN FOR NEXT SESSION: plan to D/C at end of cert 59/27/63 assure HEP in place  Ethel Rana DPT 11/05/22 2:48 PM   Rosalee Tolley,ANGIE, PTA 11/05/2022, 2:48 PM [address] [place], [place], [address] Phone: [phone]   Fax:  [phone]  Patient Details  Name: Beckey Polkowski MRN: [id number] Date of Birth: May 10, 1946 Referring Provider:  Loura Pardon, MD  Encounter Date: 12/5/2023[facility] [address] [place], [place], [address] Phone: [phone]   Fax:  [phone]  Patient Details  Name: Sagan Wurzel MRN: [id number] Date of Birth: January 22, 1946 Referring Provider:  Loura Pardon, MD  Encounter Date: 11/05/2022   Laqueta Carina, PTA 11/05/2022, 2:48 PM  [address] [place], [place], [address] Phone: [phone]   Fax:  [phone]

## 2022-11-07 ENCOUNTER — Ambulatory Visit: Payer: Medicare PPO | Admitting: Physical Therapy

## 2022-11-07 DIAGNOSIS — M5459 Other low back pain: Secondary | ICD-10-CM | POA: Diagnosis not present

## 2022-11-07 DIAGNOSIS — M62830 Muscle spasm of back: Secondary | ICD-10-CM

## 2022-11-07 NOTE — Therapy (Signed)
OUTPATIENT PHYSICAL THERAPY THORACOLUMBAR TREATMENT  Progress Note Reporting Period 09/10/22 to 11/01/22  See note below for Objective Data and Assessment of Progress/Goals.    Patient Name: Robin Hutchinson MRN: [id number] DOB:1946-07-24, 76 y.o., female Today's Date: 11/07/2022   PT End of Session - 11/07/22 1446     Visit Number 12    Number of Visits 13    Date for PT Re-Evaluation 11/10/22    Authorization Type [employer]    PT Start Time 1445    PT Stop Time 1540    PT Time Calculation (min) 55 min             Past Medical History:  Diagnosis Date   Arthritis    Hypercholesterolemia    Osteoporosis    Past Surgical History:  Procedure Laterality Date   CESAREAN SECTION     TOTAL KNEE ARTHROPLASTY Right 01/20/2019   Procedure: TOTAL KNEE ARTHROPLASTY;  Surgeon: Dorna Leitz, MD;  Location: [facility];  Service: Orthopedics;  Laterality: Right;   Patient Active Problem List   Diagnosis Date Noted   Primary osteoarthritis of right knee 01/20/2019    PCP: Kathyrn Lass  REFERRING PROVIDER: Lucius Conn  REFERRING DIAG: LBP, sciatica  Rationale for Evaluation and Treatment Rehabilitation  THERAPY DIAG:  Muscle spasm of back  ONSET DATE: August 2023  SUBJECTIVE:                                                                                                                                                                                           SUBJECTIVE STATEMENT: doing good  PERTINENT HISTORY:  above  PAIN:  Are you having pain? Yes left hip 1/10, left shld 3/10   PRECAUTIONS: None  WEIGHT BEARING RESTRICTIONS No  FALLS:  Has patient fallen in last 6 months? No  LIVING ENVIRONMENT: Lives with: lives with their family Lives in: House/apartment Stairs: No Has following equipment at home: None  OCCUPATION: retired  PLOF: Independent and cook and clean  PATIENT GOALS less pain, walk and travel again   OBJECTIVE:   DIAGNOSTIC FINDINGS:  Xrays  show scoliosis  PATIENT SURVEYS:  FOTO 43  COGNITION:  Overall cognitive status: Within functional limits for tasks assessed     SENSATION: WFL  MUSCLE LENGTH: Hamstrings: Right 50 deg; Left 50 deg  POSTURE: rounded shoulders and forward head  PALPATION: She is tight in the lumbar and buttocks, tender in the buttocks  LUMBAR ROM:   Active  A/PROM  eval 10/15/22 11/01/22  Flexion Decreased 50% WFLs WFLS  Extension 50% Decreased 50% Decreased 30%  Right lateral flexion 50% WFLS WFLs  Left lateral  flexion 50% WFLs WFLs  Right rotation     Left rotation      (Blank rows = not tested)  LOWER EXTREMITY ROM:     Active  Right eval Left eval  Hip flexion    Hip extension    Hip abduction    Hip adduction    Hip internal rotation    Hip external rotation    Knee flexion 100 100  Knee extension 10 10  Ankle dorsiflexion    Ankle plantarflexion    Ankle inversion    Ankle eversion     (Blank rows = not tested)  LOWER EXTREMITY MMT:   4-/5 with some low back pain    FUNCTIONAL TESTS:  Timed up and go (TUG): 22  GAIT: Distance walked: 50 feet Assistive device utilized: None Level of assistance: Complete Independence Comments: mild antalgic on the left    TODAY'S TREATMENT   11/07/22 Nustep L 6 42mn UBE L 4 2 min fwd/2 min backward Lat pull 20# 2 sets 10 Seated row 20# 2 sets 10 Black tband trunk flex/ext  STS with wt ball press 2 sets 10 Wt ball trunk ext and rotation 10 x each Upright row 20# 2 sets 10 Cable pulley shld ext 10# 2 sets 10 Ball vs wall 5 x CW and CCW Premod [facility]  11/05/22 Nustep L 6 6 min 10# cable pulleys shld ext 2 sets 10, and row 2 sets 10 with cuing for core engagement 10# AR press 10 x each side STS with wt ball OH press 10 x Step up opp ext ext 10 x the latral step up with opp leg abd 10 x . Cuing and UE support UBE L 4 2 min fwd/2 min backward Lat pull 20# 2 sets 10 Seated row 20# 2 sets 10 Black tband trunk  flex/ext  Premod estim and MH     11/01/22 Nustep L 5 7 min Lat pull 20# 2 sets 10 Seated row 20# 2 sets 10 Black tband trunk flex/ext  Resisted gait 4 way 4 x each 30# STS wt ball chest press 10 x Wt ball trunk ext and rotation 10 x each 4 inch step up with opp hip ext with UE support 10 each and then laterally with opp leg abd UBE 2 min fwd/ 2 min back Premod left shld/left LB with MH supine   10/22/22 Bike L4 5 min UBE 2 min wd/2 min backward Lat pull 20# 2 sets 10 Seated row 20# 2 sets 10 Black tband trunk flex/ext  Resisted gait 4 way 4 x each 30# STS wt ball chest press 10 x Wt ball trunk ext and rotation 10 x each Premod left shld/left LB with MH supine   10/17/22 Nustep L 5 6 min [id number] HEP red tband scap stab and LE 3 way Trunk flex and ext black tband Seated row 20# 2 sets 10 Knee ext 2 sets 10  10# HS curl 2 sets 10  20# STS with wt ball press 10 x IFC and MH LEft LB and hip with MH for shld 15 min   10/15/22 Nustep L 4 633m 20# resisted gait 5 x fwd and 3 x each side STS with wt ball chest press 10 x Trunk flex and ext black tband Seated row 20# 2 sets 10 Red tband BIL hip 3 way 10 x each IFC and MH LEft LB and hip with MH for shld 15 min  09/24/22  Educ of good  BM for ADL with squating and reaching to avoid injury- get close to obj and use LE Sit fit 10 x 4 way Red tband scap stab on sit fit. LE strengthening with stab while on sit fit 15 x each Feet on ball bridge,KTC and obl 15 x Isometric abdominal press 15 x PROM LE/trunk Nustep L 4 6 min Black tband trunk flex and ext IFC/MH Left LB 15 min supine   09/19/22  Sit fit 10 x 4 way Red tband scap stab on sit fit Feet on ball bridge,KTC and obl  Isometric abdominal press Hooklying clams and hip flex green tband PROM LE Nustep L 4 6 min Seated row 15# 10 x Lat pull 15# 10 # Black tband trunk ext 2 sets 10 STS with wt ball chest pess 10 x    09/17/22 Pelvic tilts Feet on  ball K2C, trunk rotation, small bridges and isometric abs SLR's supine with core activation 2x10 each LE stretches Hooklying green tband clamshells 2x10 MHP/IFC on the low back  09/10/22 MHP/IFC to the L/S area supine   PATIENT EDUCATION:  Education details: Flexion exercises and piriformis stretches Person educated: Patient Education method: Consulting civil engineer, Media planner, Verbal cues, and Handouts Education comprehension: verbalized understanding   HOME EXERCISE PROGRAM: See above  ASSESSMENT:  CLINICAL IMPRESSION: progressed strenthening with cuing to engage core. Verb reviewed HEP. Goals met  OBJECTIVE IMPAIRMENTS Abnormal gait, decreased activity tolerance, decreased mobility, difficulty walking, decreased ROM, decreased strength, increased fascial restrictions, increased muscle spasms, impaired flexibility, improper body mechanics, postural dysfunction, and pain.   REHAB POTENTIAL: Good  CLINICAL DECISION MAKING: Stable/uncomplicated  EVALUATION COMPLEXITY: Low   GOALS: Goals reviewed with patient? Yes  SHORT TERM GOALS: Target date: 09/24/22  Independent with initial HEP Goal status: met 09/19/22  LONG TERM GOALS: Target date: 01/30/2023  Understand posture and body mechanics Goal status: progressing 10/22/22 11/01/22 met  2.  Decrease pain 50% Goal status: progressing 10/15/22 Met 75% + 11/01/22  3.  Increase lumbar ROM 25% Goal status: 10/15/22 progressing 11/01/22 MET  4.  Walk 30 minutes for exercise Goal status: 10/15/22 20-30 min 11/01/22 progressing limitations mostly by knee  5.  Tolerate sitting >30 minutes Goal status: 10/15/22 MET  PLAN: PT FREQUENCY: 1-2x/week  PT DURATION: 12 weeks  PLANNED INTERVENTIONS: Therapeutic exercises, Therapeutic activity, Neuromuscular re-education, Balance training, Gait training, Patient/Family education, Self Care, Joint mobilization, Dry Needling, Electrical stimulation, Spinal mobilization, Cryotherapy, Moist  heat, Traction, and Manual therapy.  PLAN FOR NEXT SESSION: D/C next session Levada Dy Jahkai Yandell PTA 11/07/22 2:47 PM   Farzana Koci,ANGIE, PTA 11/07/2022, 2:47 PM [address]. [place], [place], [address] Phone: [phone]   Fax:  [phone]  Patient Details  Name: Annahi Short MRN: [id number] Date of Birth: [birth year] Referring Provider:  Loura Pardon, MD  Encounter Date: 12/7/2023[facility] [address] [place], [place], [address] Phone: [phone]   Fax:  [phone]  Patient Details  Name: Frady Taddeo MRN: [id number] Date of Birth: 1946/01/10 Referring Provider:  Loura Pardon, MD  Encounter Date: 11/07/2022   Laqueta Carina, PTA 11/07/2022, 2:47 PM  [employer] [place], [place], [address] Phone: [phone]   Fax:  [address]. [place], [place], [address] Phone: [phone]   Fax:  [phone]  Patient Details  Name: Jacqualine Weichel MRN: [id number] Date of Birth: Apr 05, 1946 Referring Provider:  Loura Pardon, MD  Encounter Date: 11/07/2022   Vickii Penna 11/07/2022, 2:47 PM  [address]. [place], [place], [address] Phone: [phone]   Fax:  [phone]

## 2022-11-12 ENCOUNTER — Ambulatory Visit: Payer: Medicare PPO | Admitting: Physical Therapy

## 2022-11-12 DIAGNOSIS — M62830 Muscle spasm of back: Secondary | ICD-10-CM

## 2022-11-12 DIAGNOSIS — M5459 Other low back pain: Secondary | ICD-10-CM | POA: Diagnosis not present

## 2022-11-12 NOTE — Therapy (Signed)
OUTPATIENT PHYSICAL THERAPY THORACOLUMBAR TREATMENT  Progress Note Reporting Period 09/10/22 to 11/01/22  See note below for Objective Data and Assessment of Progress/Goals.    Patient Name: Robin Hutchinson MRN: [id number] DOB:1946-07-29, 76 y.o., female Today's Date: 11/12/2022   PT End of Session - 11/12/22 1147     Visit Number 13    Date for PT Re-Evaluation 11/10/22    PT Start Time 1130    PT Stop Time 1215    PT Time Calculation (min) 45 min             Past Medical History:  Diagnosis Date   Arthritis    Hypercholesterolemia    Osteoporosis    Past Surgical History:  Procedure Laterality Date   CESAREAN SECTION     TOTAL KNEE ARTHROPLASTY Right 01/20/2019   Procedure: TOTAL KNEE ARTHROPLASTY;  Surgeon: Dorna Leitz, MD;  Location: [facility];  Service: Orthopedics;  Laterality: Right;   Patient Active Problem List   Diagnosis Date Noted   Primary osteoarthritis of right knee 01/20/2019    PCP: Kathyrn Lass  REFERRING PROVIDER: Lucius Conn  REFERRING DIAG: LBP, sciatica  Rationale for Evaluation and Treatment Rehabilitation  THERAPY DIAG:  Muscle spasm of back  Other low back pain  ONSET DATE: August 2023  SUBJECTIVE:                                                                                                                                                                                           SUBJECTIVE STATEMENT: doing good, ready for D/C  PERTINENT HISTORY:  above  PAIN:  Are you having pain? Yes left hip 1/10, left shld 3/10   PRECAUTIONS: None  WEIGHT BEARING RESTRICTIONS No  FALLS:  Has patient fallen in last 6 months? No  LIVING ENVIRONMENT: Lives with: lives with their family Lives in: House/apartment Stairs: No Has following equipment at home: None  OCCUPATION: retired  PLOF: Independent and cook and clean  PATIENT GOALS less pain, walk and travel again   OBJECTIVE:   DIAGNOSTIC FINDINGS:  Xrays show  scoliosis  PATIENT SURVEYS:  FOTO 43  COGNITION:  Overall cognitive status: Within functional limits for tasks assessed     SENSATION: WFL  MUSCLE LENGTH: Hamstrings: Right 50 deg; Left 50 deg  POSTURE: rounded shoulders and forward head  PALPATION: She is tight in the lumbar and buttocks, tender in the buttocks  LUMBAR ROM:   Active  A/PROM  eval 10/15/22 11/01/22  Flexion Decreased 50% WFLs WFLS  Extension 50% Decreased 50% Decreased 30%  Right lateral flexion 50% WFLS WFLs  Left lateral flexion 50% WFLs WFLs  Right rotation     Left rotation      (Blank rows = not tested)  LOWER EXTREMITY ROM:     Active  Right eval Left eval  Hip flexion    Hip extension    Hip abduction    Hip adduction    Hip internal rotation    Hip external rotation    Knee flexion 100 100  Knee extension 10 10  Ankle dorsiflexion    Ankle plantarflexion    Ankle inversion    Ankle eversion     (Blank rows = not tested)  LOWER EXTREMITY MMT:   4-/5 with some low back pain    FUNCTIONAL TESTS:  Timed up and go (TUG): 22  GAIT: Distance walked: 50 feet Assistive device utilized: None Level of assistance: Complete Independence Comments: mild antalgic on the left    TODAY'S TREATMENT   11/12/22 Nustep L 6 59mn UBE L 4 2 min fwd/2 min backward Lat pull 20# 2 sets 10 Seated row 20# 2 sets 10 Black tband trunk flex/ext 20 x each STS with ball press 2 sets 10 4# standing shld ext,abd ,chest press and upright row 10 x Premod estim and MH  11/07/22 Nustep L 6 672m UBE L 4 2 min fwd/2 min backward Lat pull 20# 2 sets 10 Seated row 20# 2 sets 10 Black tband trunk flex/ext  STS with wt ball press 2 sets 10 Wt ball trunk ext and rotation 10 x each Upright row 20# 2 sets 10 Cable pulley shld ext 10# 2 sets 10 Ball vs wall 5 x CW and CCW [facility] 11/05/22 Nustep L 6 6 min 10# cable pulleys shld ext 2 sets 10, and row 2 sets 10 with cuing for core  engagement 10# AR press 10 x each side STS with wt ball OH press 10 x Step up opp ext ext 10 x the latral step up with opp leg abd 10 x . Cuing and UE support UBE L 4 2 min fwd/2 min backward Lat pull 20# 2 sets 10 Seated row 20# 2 sets 10 Black tband trunk flex/ext  Premod estim and MH     11/01/22 Nustep L 5 7 min Lat pull 20# 2 sets 10 Seated row 20# 2 sets 10 Black tband trunk flex/ext  Resisted gait 4 way 4 x each 30# STS wt ball chest press 10 x Wt ball trunk ext and rotation 10 x each 4 inch step up with opp hip ext with UE support 10 each and then laterally with opp leg abd UBE 2 min fwd/ 2 min back Premod left shld/left LB with MH supine   10/22/22 Bike L4 5 min UBE 2 min wd/2 min backward Lat pull 20# 2 sets 10 Seated row 20# 2 sets 10 Black tband trunk flex/ext  Resisted gait 4 way 4 x each 30# STS wt ball chest press 10 x Wt ball trunk ext and rotation 10 x each Premod left shld/left LB with MH supine   10/17/22 Nustep L 5 6 min [id number] red tband scap stab and LE 3 way Trunk flex and ext black tband Seated row 20# 2 sets 10 Knee ext 2 sets 10  10# HS curl 2 sets 10  20# STS with wt ball press 10 x IFC and MH LEft LB and hip with MH for shld 15 min   10/15/22 Nustep L 4 [id number]# resisted gait 5 x fwd and 3 x  each side STS with wt ball chest press 10 x Trunk flex and ext black tband Seated row 20# 2 sets 10 Red tband BIL hip 3 way 10 x each IFC and MH LEft LB and hip with MH for shld 15 min  09/24/22  Educ of good BM for ADL with squating and reaching to avoid injury- get close to obj and use LE Sit fit 10 x 4 way Red tband scap stab on sit fit. LE strengthening with stab while on sit fit 15 x each Feet on ball bridge,KTC and obl 15 x Isometric abdominal press 15 x PROM LE/trunk Nustep L 4 6 min Black tband trunk flex and ext IFC/MH Left LB 15 min supine   09/19/22  Sit fit 10 x 4 way Red tband scap stab on sit fit Feet on ball  bridge,KTC and obl  Isometric abdominal press Hooklying clams and hip flex green tband PROM LE Nustep L 4 6 min Seated row 15# 10 x Lat pull 15# 10 # Black tband trunk ext 2 sets 10 STS with wt ball chest pess 10 x    09/17/22 Pelvic tilts Feet on ball K2C, trunk rotation, small bridges and isometric abs SLR's supine with core activation 2x10 each LE stretches Hooklying green tband clamshells 2x10 MHP/IFC on the low back  09/10/22 MHP/IFC to the L/S area supine   PATIENT EDUCATION:  Education details: Flexion exercises and piriformis stretches Person educated: Patient Education method: Consulting civil engineer, Media planner, Verbal cues, and Handouts Education comprehension: verbalized understanding   HOME EXERCISE PROGRAM: See above  ASSESSMENT:  CLINICAL IMPRESSION: all goals met, pt pleased and feels she can continue with HEP    OBJECTIVE IMPAIRMENTS Abnormal gait, decreased activity tolerance, decreased mobility, difficulty walking, decreased ROM, decreased strength, increased fascial restrictions, increased muscle spasms, impaired flexibility, improper body mechanics, postural dysfunction, and pain.   REHAB POTENTIAL: Good  CLINICAL DECISION MAKING: Stable/uncomplicated  EVALUATION COMPLEXITY: Low   GOALS: Goals reviewed with patient? Yes  SHORT TERM GOALS: Target date: 09/24/22  Independent with initial HEP Goal status: met 09/19/22  LONG TERM GOALS: Target date: 02/04/2023  Understand posture and body mechanics Goal status: progressing 10/22/22 11/01/22 met  2.  Decrease pain 50% Goal status: progressing 10/15/22 Met 75% + 11/01/22  3.  Increase lumbar ROM 25% Goal status: 10/15/22 progressing 11/01/22 MET  4.  Walk 30 minutes for exercise Goal status: 10/15/22 20-30 min 11/01/22 progressing limitations mostly by knee  5.  Tolerate sitting >30 minutes Goal status: 10/15/22 MET  PLAN: PT FREQUENCY: 1-2x/week  PT DURATION: 12 weeks  PLANNED  INTERVENTIONS: Therapeutic exercises, Therapeutic activity, Neuromuscular re-education, Balance training, Gait training, Patient/Family education, Self Care, Joint mobilization, Dry Needling, Electrical stimulation, Spinal mobilization, Cryotherapy, Moist heat, Traction, and Manual therapy.  PLAN FOR NEXT SESSION: D/C  PHYSICAL THERAPY DISCHARGE SUMMARY   Patient agrees to discharge. Patient goals were met. Patient is being discharged due to meeting the stated rehab goals.  Levada Dy Jaydeen Odor PTA 11/12/22 11:48 AM   Laurena Valko,ANGIE, PTA 11/12/2022, 11:48 AM [place] [place], [place], [address] Phone: [phone]   Fax:  [phone]  Patient Details  Name: Maia Handa MRN: [id number] Date of Birth: 1946/10/31 Referring Provider:  Loura Pardon, MD  Encounter Date: 12/12/2023[facility] [address] [place], [place], [address] Phone: [phone]   Fax:  [phone]  Patient Details  Name: Elorah Dewing MRN: [id number] Date of Birth: [birth year]  Referring Provider:  Loura Pardon, MD  Encounter Date: 11/12/2022   Laqueta Carina, PTA 11/12/2022, 11:48 AM  [place] [place], [place], [address] Phone: [phone]   Fax:  [place]. [place], [place], [address] Phone: [phone]   Fax:  [phone]  Patient Details  Name: Majorie Santee MRN: [id number] Date of Birth: 23-Aug-1946 Referring Provider:  Loura Pardon, MD  Encounter Date: 11/12/2022   Laqueta Carina, PTA 11/12/2022, 11:48 AM  [place]. [place], [place], [address] Phone: [phone]   Fax:  [place]. [place], [place], [address] Phone: [phone]   Fax:  [phone]  Patient Details  Name: Aminah Zabawa MRN: [id number] Date of Birth: [birth year] Referring Provider:  Loura Pardon, MD  Encounter Date: 11/12/2022   Laqueta Carina, PTA 11/12/2022, 11:48 AM  [place]. [place], [place], [address] Phone: [phone]   Fax:  [phone]

## 2022-11-14 ENCOUNTER — Ambulatory Visit: Payer: Medicare PPO | Admitting: Physical Therapy

## 2022-12-31 ENCOUNTER — Other Ambulatory Visit: Payer: Self-pay | Admitting: Family Medicine

## 2022-12-31 DIAGNOSIS — R109 Unspecified abdominal pain: Secondary | ICD-10-CM

## 2023-01-04 ENCOUNTER — Ambulatory Visit (HOSPITAL_BASED_OUTPATIENT_CLINIC_OR_DEPARTMENT_OTHER)
Admission: RE | Admit: 2023-01-04 | Discharge: 2023-01-04 | Disposition: A | Discharge status: Home / Self Care | Payer: Medicare PPO | Source: Ambulatory Visit | Attending: Family Medicine | Admitting: Family Medicine

## 2023-01-04 DIAGNOSIS — R109 Unspecified abdominal pain: Secondary | ICD-10-CM | POA: Insufficient documentation

## 2023-03-17 ENCOUNTER — Other Ambulatory Visit (HOSPITAL_BASED_OUTPATIENT_CLINIC_OR_DEPARTMENT_OTHER): Payer: Self-pay | Admitting: Family Medicine

## 2023-03-17 DIAGNOSIS — Z1231 Encounter for screening mammogram for malignant neoplasm of breast: Secondary | ICD-10-CM

## 2023-03-24 ENCOUNTER — Ambulatory Visit (HOSPITAL_BASED_OUTPATIENT_CLINIC_OR_DEPARTMENT_OTHER)
Admission: RE | Admit: 2023-03-24 | Discharge: 2023-03-24 | Disposition: A | Discharge status: Home / Self Care | Payer: Medicare PPO | Source: Ambulatory Visit | Attending: Family Medicine | Admitting: Family Medicine

## 2023-03-24 ENCOUNTER — Encounter (HOSPITAL_BASED_OUTPATIENT_CLINIC_OR_DEPARTMENT_OTHER): Payer: Self-pay

## 2023-03-24 DIAGNOSIS — Z1231 Encounter for screening mammogram for malignant neoplasm of breast: Secondary | ICD-10-CM | POA: Insufficient documentation

## 2023-09-19 ENCOUNTER — Ambulatory Visit: Payer: Medicare HMO | Attending: Internal Medicine | Admitting: Internal Medicine

## 2023-09-19 ENCOUNTER — Encounter: Payer: Self-pay | Admitting: Internal Medicine

## 2023-09-19 VITALS — BP 112/74 | HR 83 | Ht 62.0 in | Wt 147.0 lb

## 2023-09-19 DIAGNOSIS — I2510 Atherosclerotic heart disease of native coronary artery without angina pectoris: Secondary | ICD-10-CM | POA: Diagnosis not present

## 2023-09-19 DIAGNOSIS — I340 Nonrheumatic mitral (valve) insufficiency: Secondary | ICD-10-CM | POA: Diagnosis not present

## 2023-09-19 DIAGNOSIS — E785 Hyperlipidemia, unspecified: Secondary | ICD-10-CM

## 2023-09-19 DIAGNOSIS — I519 Heart disease, unspecified: Secondary | ICD-10-CM | POA: Diagnosis not present

## 2023-09-19 DIAGNOSIS — Z79899 Other long term (current) drug therapy: Secondary | ICD-10-CM

## 2023-09-19 DIAGNOSIS — Z136 Encounter for screening for cardiovascular disorders: Secondary | ICD-10-CM | POA: Diagnosis not present

## 2023-09-19 MED ORDER — ATORVASTATIN CALCIUM 40 MG PO TABS: 40.0000 mg | ORAL_TABLET | Freq: Every day | ORAL | 3 refills | Status: AC

## 2023-09-19 NOTE — Patient Instructions (Addendum)
Medication Instructions:  Increase Atorvastatin to 40 mg daily *If you need a refill on your cardiac medications before your next appointment, please call your pharmacy*   Lab Work: Lab work when you come back for your follow up after tests= Fasting Lipid and Liver  If you have labs (blood work) drawn today and your tests are completely normal, you will receive your results only by: MyChart Message (if you have MyChart) OR A paper copy in the mail If you have any lab test that is abnormal or we need to change your treatment, we will call you to review the results.   Testing/Procedures: Your physician has requested that you have a Limited echocardiogram. Echocardiography is a painless test that uses sound waves to create images of your heart. It provides your doctor with information about the size and shape of your heart and how well your heart's chambers and valves are working. This procedure takes approximately one hour. There are no restrictions for this procedure. Please do NOT wear cologne, perfume, aftershave, or lotions (deodorant is allowed). Please arrive 15 minutes prior to your appointment time.    Follow-Up: At [facility], you and your health needs are our priority.  As part of our continuing mission to provide you with exceptional heart care, we have created designated Provider Care Teams.  These Care Teams include your primary Cardiologist (physician) and Advanced Practice Providers (APPs -  Physician Assistants and Nurse Practitioners) who all work together to provide you with the care you need, when you need it.  We recommend signing up for the patient portal called "MyChart".  Sign up information is provided on this After Visit Summary.  MyChart is used to connect with patients for Virtual Visits (Telemedicine).  Patients are able to view lab/test results, encounter notes, upcoming appointments, etc.  Non-urgent messages can be sent to your provider as well.   To  learn more about what you can do with MyChart, go to [email or web address].    Your next appointment:    Follow up with Dr Jacques Navy or with APP after Echo

## 2023-09-19 NOTE — Progress Notes (Signed)
Cardiology Office Note:  .   Date:  09/19/2023  ID:  Robin Hutchinson, DOB [birth year], MRN [id number] PCP: Sharmon Revere, MD  [facility] Providers Cardiologist:  None    History of Present Illness: Robin Hutchinson is a 77 y.o. female.  Discussed the use of AI scribe software for clinical note transcription with the patient, who gave verbal consent to proceed.  History of Present Illness   The patient, with a history of high cholesterol, was referred to cardiology following tests that revealed some plaque and calcium in her aorta and a slightly enlarged RV by outside echo. I cannot independently review these images. She reports feeling well overall, with no chest discomfort, shortness of breath, or palpitations. She does experience occasional drowsiness after meals. The patient has been on Atorvastatin 10 mg for her high cholesterol for at least seven years. She also mentions a recent high blood sugar reading, but it is unclear if this is a new diagnosis or an exacerbation of a known condition.        ROS: negative except per HPI above.  Studies Reviewed: Marland Kitchen   EKG Interpretation Date/Time:  Friday September 19 2023 08:03:49 EDT Ventricular Rate:  83 PR Interval:  154 QRS Duration:  80 QT Interval:  368 QTC Calculation: 432 R Axis:   50  Text Interpretation: Normal sinus rhythm Possible Left atrial enlargement When compared with ECG of 13-Jan-2019 09:49, No significant change was found Confirmed by Weston Brass ([id number]) on 09/19/2023 8:20:37 AM    Results   LABS LDL: 120 HbA1c: 6.0 (03/2023)  RADIOLOGY Brain MRI: Small vessel disease (01/2022) Abdominal CT: Plaque and calcium in coronary arteries, including left anterior descending artery and RCA (01/2023)  DIAGNOSTIC Echocardiogram: Left ventricular ejection fraction within normal limits and mild mod RV enlargement EKG: Normal sinus rhythm, possible LAE (09/18/2023)     Risk Assessment/Calculations:              Physical Exam:   VS:  BP 112/74   Pulse 83   Ht 5\' 2"  (1.575 m)   Wt 147 lb (66.7 kg)   SpO2 96%   BMI 26.89 kg/m    Wt Readings from Last 3 Encounters:  09/19/23 147 lb (66.7 kg)  02/15/22 153 lb (69.4 kg)  01/20/19 152 lb (68.9 kg)     Physical Exam   CHEST: Lungs clear to auscultation. CARDIOVASCULAR: Heart sounds normal on auscultation.     GEN: Well nourished, well developed in no acute distress NECK: No JVD; No carotid bruits CARDIAC: RRR, no murmurs, rubs, gallops RESPIRATORY:  Clear to auscultation without rales, wheezing or rhonchi  ABDOMEN: Soft, non-tender, non-distended EXTREMITIES:  No edema; No deformity   ASSESSMENT AND PLAN: .    1. Screening for heart disease   2. Mitral valve insufficiency, unspecified etiology   3. Right ventricular dysfunction     Assessment and Plan  Query right ventricular dysfunction and pulmonary hypertension  Patient had an outside echocardiogram which I cannot independently review, but there are comments about low normal ejection fraction, mild to moderately reduced right ventricular function and mild pulmonary hypertension.  This needs to be confirmed.  Patient is asymptomatic.  We will obtain a limited echocardiogram with agitated saline injection to ensure we have evaluated for a shunt in the setting of presumed right heart dysfunction, this will also allow [place] to revisit her mitral valve regurgitation to confirm findings.  Request reevaluation of biventricular function, valvular heart disease, and  an assessment of pulmonary pressures.    Coronary Artery Disease Evidence of plaque and calcium in coronary arteries on imaging. No symptoms of chest discomfort or shortness of breath with activity. Currently on Atorvastatin 10mg  for hyperlipidemia. -Increase Atorvastatin to 40mg  daily. -Check cholesterol levels at next visit.  Hyperlipidemia Last cholesterol check was a few years ago. Last known LDL was 120, goal is 70 due to  presence of coronary artery disease. -Increase Atorvastatin to 40mg  daily. -Check cholesterol levels at next visit.  Possible Diabetes Patient reports high sugar levels recently. Last known HbA1c was 6.0 in April. -Recommend follow-up with primary care doctor for further evaluation and management.  Daytime fatigue Patient reports feeling tired after meals. No known history of snoring. -Advise patient to ask family members about possible snoring. -Consider evaluation for sleep apnea if snoring is confirmed.  Orthopedic Issues Patient reports knee and back problems limiting physical activity. -Continue current management. -Encourage patient to maintain physical activity as tolerated.  Follow-up Plan to review results of echocardiogram and cholesterol levels at next visit.                Total time of encounter: 45 minutes total time of encounter, including 30 minutes spent in face-to-face patient care on the date of this encounter. This time includes coordination of care and counseling regarding above mentioned problem list. Remainder of non-face-to-face time involved reviewing chart documents/testing relevant to the patient encounter and documentation in the medical record. I have independently reviewed documentation from referring provider.   Weston Brass, MD, [facility] [place]  [facility]

## 2023-10-15 LAB — HEPATIC FUNCTION PANEL
ALT: 19 [IU]/L (ref 0–32)
AST: 27 [IU]/L (ref 0–40)
Albumin: 4.3 g/dL (ref 3.8–4.8)
Alkaline Phosphatase: 40 [IU]/L — ABNORMAL LOW (ref 44–121)
Bilirubin Total: 0.6 mg/dL (ref 0.0–1.2)
Bilirubin, Direct: 0.19 mg/dL (ref 0.00–0.40)
Total Protein: 7 g/dL (ref 6.0–8.5)

## 2023-10-15 LAB — LIPID PANEL
Chol/HDL Ratio: 3.1 {ratio} (ref 0.0–4.4)
Cholesterol, Total: 168 mg/dL (ref 100–199)
HDL: 55 mg/dL (ref >39)
LDL Chol Calc (NIH): 90 mg/dL (ref 0–99)
Triglycerides: 130 mg/dL (ref 0–149)
VLDL Cholesterol Cal: 23 mg/dL (ref 5–40)

## 2023-10-17 ENCOUNTER — Ambulatory Visit (HOSPITAL_COMMUNITY): Payer: Medicare HMO | Attending: Internal Medicine

## 2023-10-17 DIAGNOSIS — I340 Nonrheumatic mitral (valve) insufficiency: Secondary | ICD-10-CM | POA: Insufficient documentation

## 2023-10-17 DIAGNOSIS — I519 Heart disease, unspecified: Secondary | ICD-10-CM | POA: Insufficient documentation

## 2023-10-17 LAB — ECHOCARDIOGRAM LIMITED
Area-P 1/2: 3.66 cm2
S' Lateral: 2.2 cm

## 2023-10-19 NOTE — Progress Notes (Unsigned)
Cardiology Clinic Note   Patient Name: Robin Hutchinson Date of Encounter: 10/22/2023  Primary Care Provider:  Sharmon Revere, MD Primary Cardiologist:  None  Patient Profile    Robin Hutchinson 77 year old female presents to the clinic today for follow-up evaluation of her coronary artery calcification hyperlipidemia, and mitral valve insufficiency.   Past Medical History    Past Medical History:  Diagnosis Date   Arthritis    Hypercholesterolemia    Osteoporosis    Past Surgical History:  Procedure Laterality Date   CESAREAN SECTION     TOTAL KNEE ARTHROPLASTY Right 01/20/2019   Procedure: TOTAL KNEE ARTHROPLASTY;  Surgeon: Jodi Geralds, MD;  Location: [facility];  Service: Orthopedics;  Laterality: Right;    Allergies  Allergies  Allergen Reactions   Aspirin Nausea Only   Oxycodone Nausea And Vomiting   Sulfa Antibiotics Rash    History of Present Illness    Robin Hutchinson has a PMH of mitral valve insufficiency, coronary artery calcification, hyperlipidemia, and diastolic dysfunction.  She was referred to cardiology for evaluation of her aorta and enlarged RV that were found on echocardiogram.  She was seen by Dr. Jacques Navy on 09/19/2023.  During that time she denied chest pain, shortness of breath, and palpitations.  She felt well overall.  She did note occasional episodes of drowsiness after meals.  She was taking a atorvastatin 10 mg daily and had been for years.  Echocardiogram was ordered and showed LVEF of 60 to 65%, G1 DD, trivial mitral valve regurgitation, mildly enlarged right ventricle, positive bubble study suggesting weakly small PFO.  She presents to the clinic today for follow-up evaluation and to review her echocardiogram.  She states she continues to walk for 15 minutes, 3 times per day. She has been eating a low salt diet. We reviewed her Echo. She had he husband expressed understanding. I reviewed the recent lipid panel and LFTs. LDL cho was noted to be 90. She is compliant  with her medications. I will add Zetia to her medication regimen, plan lipids and lfts in 3 months and plan ROV in 6-9 monhts.   Today she denies chest pain, shortness of breath, lower extremity edema, fatigue, palpitations, melena, hematuria, hemoptysis, diaphoresis, weakness, presyncope, syncope, orthopnea, and PND.     Home Medications    Prior to Admission medications   Medication Sig Start Date End Date Taking? Authorizing Provider  acetaminophen (TYLENOL) 325 MG tablet Take 325 mg by mouth every 6 (six) hours as needed. 05/01/22   [provider]  alendronate (FOSAMAX) 70 MG tablet Take 70 mg by mouth every Sunday. Take with a full glass of water on an empty stomach.    [provider]  atorvastatin (LIPITOR) 40 MG tablet Take 1 tablet (40 mg total) by mouth daily. 09/19/23   Parke Poisson, MD  diclofenac (VOLTAREN) 50 MG EC tablet Take 50 mg by mouth 2 (two) times daily. 09/04/23   [provider]  Glucosamine HCl (GLUCOSAMINE PO) Take 2 tablets by mouth daily.    [provider]  meclizine (ANTIVERT) 25 MG tablet Take 1 tablet (25 mg total) by mouth 3 (three) times daily as needed for dizziness. Patient not taking: Reported on 09/19/2023 02/15/22   Melene Plan, DO  meloxicam (MOBIC) 15 MG tablet Take 15 mg by mouth daily as needed for pain. Patient not taking: Reported on 09/19/2023    [provider]  Multiple Vitamin (MULTIVITAMIN) tablet Take 1 tablet by mouth daily.  [provider]  Propylene Glycol (SYSTANE COMPLETE OP) Place 1 drop into both eyes in the morning, at noon, and at bedtime. Patient not taking: Reported on 09/19/2023    [provider]  VITAMIN D PO Take 1 tablet by mouth daily.    [provider]    Family History    Family History  Problem Relation Age of Onset   Breast cancer Sister 1   Breast cancer Sister 64     Social History    Social History   Socioeconomic History    Marital status: Married    Spouse name: Not on file   Number of children: Not on file   Years of education: Not on file   Highest education level: Not on file  Occupational History   Not on file  Tobacco Use   Smoking status: Never   Smokeless tobacco: Never  Vaping Use   Vaping status: Never Used  Substance and Sexual Activity   Alcohol use: Never   Drug use: Never   Sexual activity: Not on file  Other Topics Concern   Not on file  Social History Narrative   Not on file   Social Determinants of Health   Financial Resource Strain: Not on file  Food Insecurity: Not on file  Transportation Needs: Not on file  Physical Activity: Not on file  Stress: Not on file  Social Connections: Not on file  Intimate Partner Violence: Not on file     Review of Systems    General:  No chills, fever, night sweats or weight changes.  Cardiovascular:  No chest pain, dyspnea on exertion, edema, orthopnea, palpitations, paroxysmal nocturnal dyspnea. Dermatological: No rash, lesions/masses Respiratory: No cough, dyspnea Urologic: No hematuria, dysuria Abdominal:   No nausea, vomiting, diarrhea, bright red blood per rectum, melena, or hematemesis Neurologic:  No visual changes, wkns, changes in mental status. All other systems reviewed and are otherwise negative except as noted above.  Physical Exam    VS:  BP 128/80 (BP Location: Left Arm, Patient Position: Sitting, Cuff Size: Normal)   Pulse 80   Ht 5\' 2"  (1.575 m)   Wt 147 lb (66.7 kg)   SpO2 95%   BMI 26.89 kg/m  , BMI Body mass index is 26.89 kg/m. GEN: Well nourished, well developed, in no acute distress. HEENT: normal. Neck: Supple, no JVD, carotid bruits, or masses. Cardiac: RRR, no murmurs, rubs, or gallops. No clubbing, cyanosis, edema.  Radials/DP/PT 2+ and equal bilaterally.  Respiratory:  Respirations regular and unlabored, clear to auscultation bilaterally. GI: Soft, nontender, nondistended, BS + x 4. MS: no deformity  or atrophy. Skin: warm and dry, no rash. Neuro:  Strength and sensation are intact. Psych: Normal affect.  Accessory Clinical Findings    Recent Labs: 10/14/2023: ALT 19   Recent Lipid Panel    Component Value Date/Time   CHOL 168 10/14/2023 1047   TRIG 130 10/14/2023 1047   HDL 55 10/14/2023 1047   CHOLHDL 3.1 10/14/2023 1047   LDLCALC 90 10/14/2023 1047         ECG personally reviewed by me today- none today.     Echocardiogram 10/17/2023  IMPRESSIONS     1. Left ventricular ejection fraction, by estimation, is 60 to 65%. The  left ventricle has normal function. The left ventricle has no regional  wall motion abnormalities. Left ventricular diastolic parameters are  consistent with Grade I diastolic  dysfunction (impaired relaxation).   2. Right ventricular systolic function  is normal. The right ventricular  size is mildly enlarged. There is normal pulmonary artery systolic  pressure. The estimated right ventricular systolic pressure is 33.2 mmHg.   3. The mitral valve is normal in structure. Trivial mitral valve  regurgitation. No evidence of mitral stenosis.   4. The aortic valve is tricuspid. There is mild calcification of the  aortic valve. Aortic valve regurgitation is trivial. No aortic stenosis is  present.   5. The inferior vena cava is normal in size with greater than 50%  respiratory variability, suggesting right atrial pressure of 3 mmHg.   6. Weakly positive bubble study suggesting small PFO.   7. Cysts noted in the liver, largest 4.5 cm in diameter.   Comparison(s): Prior Echo performed at outside facility (Image review  unavailable).   FINDINGS   Left Ventricle: Left ventricular ejection fraction, by estimation, is 60  to 65%. The left ventricle has normal function. The left ventricle has no  regional wall motion abnormalities. The left ventricular internal cavity  size was normal in size. There is   no left ventricular hypertrophy. Left  ventricular diastolic parameters  are consistent with Grade I diastolic dysfunction (impaired relaxation).   Right Ventricle: The right ventricular size is mildly enlarged. No  increase in right ventricular wall thickness. Right ventricular systolic  function is normal. There is normal pulmonary artery systolic pressure.  The tricuspid regurgitant velocity is 2.75   m/s, and with an assumed right atrial pressure of 3 mmHg, the estimated  right ventricular systolic pressure is 33.2 mmHg.   Left Atrium: Left atrial size was normal in size.   Right Atrium: Right atrial size was normal in size.   Mitral Valve: The mitral valve is normal in structure. Trivial mitral  valve regurgitation. No evidence of mitral valve stenosis.   Tricuspid Valve: The tricuspid valve is normal in structure. Tricuspid  valve regurgitation is trivial.   Aortic Valve: The aortic valve is tricuspid. There is mild calcification  of the aortic valve. Aortic valve regurgitation is trivial. No aortic  stenosis is present.   Pulmonic Valve: The pulmonic valve was normal in structure. Pulmonic valve  regurgitation is not visualized.   Aorta: The aortic root is normal in size and structure.   Venous: The inferior vena cava is normal in size with greater than 50%  respiratory variability, suggesting right atrial pressure of 3 mmHg.   IAS/Shunts: Weakly positive bubble study suggesting small PFO. Agitated  saline contrast was given intravenously to evaluate for intracardiac  shunting.      Assessment & Plan   1.  Coronary artery disease-denies chest pain.  Coronary calcium noted on imaging. Maintain physical activity Heart healthy low-sodium diet Continue atorvastatin No plans for ischemic evaluation  PFO, diastolic dysfunction-denies activity intolerance or DOE.  Follow-up echocardiogram showed normal LVEF, G1 DD, normal RV function, trivial mitral valve regurgitation, and positive bubble study weakly  suggesting small PFO. Will reach out to Dr. Jacques Navy to see if referral to structural heart should be neck step in treatment versus TEE. Maintain physical activity  Hyperlipidemia-LDL 90 on 10/14/23. High-fiber diet Continue atorvastatin Start Zetia  Maintain physical activity Fasting lipids and LFTs-3 months  Daytime somnolence-reports feeling fatigued after meals.  Denies orthopnea and PND.  Waking up well rested. No plans for evaluating for sleep apnea at this time. Avoid supine sleeping Sleep hygiene instructions given  Disposition: Follow-up with Dr. Jacques Navy or me in 6-9 months.   Thomasene Ripple. Lamija Besse NP-C  10/22/2023, 9:34 AM [facility] [address] Office [phone] Fax [phone]    I spent 14 minutes examining this patient, reviewing medications, and using patient centered shared decision making involving her cardiac care.   I spent greater than 20 minutes reviewing her past medical history,  medications, and prior cardiac tests.

## 2023-10-22 ENCOUNTER — Ambulatory Visit: Payer: Medicare HMO | Attending: General Practice | Admitting: General Practice

## 2023-10-22 ENCOUNTER — Encounter: Payer: Self-pay | Admitting: General Practice

## 2023-10-22 VITALS — BP 128/80 | HR 80 | Ht 62.0 in | Wt 147.0 lb

## 2023-10-22 DIAGNOSIS — I5189 Other ill-defined heart diseases: Secondary | ICD-10-CM

## 2023-10-22 DIAGNOSIS — E785 Hyperlipidemia, unspecified: Secondary | ICD-10-CM

## 2023-10-22 DIAGNOSIS — R400 Somnolence: Secondary | ICD-10-CM | POA: Diagnosis not present

## 2023-10-22 DIAGNOSIS — I2510 Atherosclerotic heart disease of native coronary artery without angina pectoris: Secondary | ICD-10-CM

## 2023-10-22 MED ORDER — EZETIMIBE 10 MG PO TABS: 10.0000 mg | ORAL_TABLET | Freq: Every day | ORAL | 3 refills | Status: AC

## 2023-10-22 NOTE — Patient Instructions (Addendum)
Medication Instructions:   Start Zetia  ( Ezetimibe )10 mg  one tablet daily     *If you need a refill on your cardiac medications before your next appointment, please call your pharmacy*   Lab Work: 3 months  fasting Lipid Liver function panel  If you have labs (blood work) drawn today and your tests are completely normal, you will receive your results only by: MyChart Message (if you have MyChart) OR A paper copy in the mail If you have any lab test that is abnormal or we need to change your treatment, we will call you to review the results.   Testing/Procedures:  Not needed  Follow-Up: At [facility], you and your health needs are our priority.  As part of our continuing mission to provide you with exceptional heart care, we have created designated Provider Care Teams.  These Care Teams include your primary Cardiologist (physician) and Advanced Practice Providers (APPs -  Physician Assistants and Nurse Practitioners) who all work together to provide you with the care you need, when you need it.     Your next appointment:   6 to 9 month(s)  The format for your next appointment:   In Person  Provider:   Parke Poisson, MD  or Edd Fabian, FNP

## 2023-10-23 ENCOUNTER — Telehealth: Payer: Self-pay | Admitting: *Deleted

## 2023-10-23 DIAGNOSIS — I2510 Atherosclerotic heart disease of native coronary artery without angina pectoris: Secondary | ICD-10-CM

## 2023-10-23 DIAGNOSIS — I5189 Other ill-defined heart diseases: Secondary | ICD-10-CM

## 2023-10-23 DIAGNOSIS — I340 Nonrheumatic mitral (valve) insufficiency: Secondary | ICD-10-CM

## 2023-10-23 DIAGNOSIS — I519 Heart disease, unspecified: Secondary | ICD-10-CM

## 2023-10-23 NOTE — Telephone Encounter (Signed)
Called spoke to patient- she is aware. She states this was not mention at appointment.   She states she would like for [facility] NP to discuss. RN informed patient weil informed Cleaver NP

## 2023-10-23 NOTE — Telephone Encounter (Signed)
Returned call to pt, notified of Edd Fabian, FNP-C comments. Verified understanding. Lab Work is in 3 months fasting, they are not for the MRI. Verbalized understanding.

## 2023-10-23 NOTE — Telephone Encounter (Signed)
-----   Message from Ronney Asters sent at 10/22/2023 11:09 AM EST ----- Please order cardiac MRI to evaluate for shunt fraction and RV dilation.   Thank you  Thomasene Ripple. Cleaver NP-C  [image]   10/22/2023, 11:10 AM [facility] [address] Office [phone] Fax [phone] ----- Message ----- From: Parke Poisson, MD Sent: 10/22/2023  11:05 AM EST To: Morrell Riddle, RN; Ronney Asters, NP  No need for structural c/s yet. I'd get a cardiac MRI to get shunt fraction since RV is dilated.  GA ----- Message ----- From: Ronney Asters, NP Sent: 10/22/2023   9:41 AM EST To: Parke Poisson, MD  Stable from a cardiac stanpoint.  Would you recommend sending to structural heart for PFO?  Do you agree with recent bubble study weekly weakly suggesting PFO.  Thanks for your help.

## 2023-12-24 ENCOUNTER — Encounter (HOSPITAL_COMMUNITY): Payer: Self-pay

## 2023-12-25 ENCOUNTER — Encounter: Payer: Self-pay | Admitting: *Deleted

## 2023-12-25 ENCOUNTER — Other Ambulatory Visit: Payer: Self-pay | Admitting: General Practice

## 2023-12-25 ENCOUNTER — Ambulatory Visit (HOSPITAL_COMMUNITY)
Admission: RE | Admit: 2023-12-25 | Discharge: 2023-12-25 | Disposition: A | Discharge status: Home / Self Care | Payer: Medicare HMO | Source: Ambulatory Visit | Attending: General Practice | Admitting: General Practice

## 2023-12-25 DIAGNOSIS — I2510 Atherosclerotic heart disease of native coronary artery without angina pectoris: Secondary | ICD-10-CM

## 2023-12-25 DIAGNOSIS — I519 Heart disease, unspecified: Secondary | ICD-10-CM

## 2023-12-25 DIAGNOSIS — I340 Nonrheumatic mitral (valve) insufficiency: Secondary | ICD-10-CM

## 2023-12-25 DIAGNOSIS — I5189 Other ill-defined heart diseases: Secondary | ICD-10-CM

## 2023-12-25 LAB — CBC
HCT: 38.7 % (ref 36.0–46.0)
Hemoglobin: 13.1 g/dL (ref 12.0–15.0)
MCH: 31.2 pg (ref 26.0–34.0)
MCHC: 33.9 g/dL (ref 30.0–36.0)
MCV: 92.1 fL (ref 80.0–100.0)
Platelets: 288 10*3/uL (ref 150–400)
RBC: 4.2 MIL/uL (ref 3.87–5.11)
RDW: 12.9 % (ref 11.5–15.5)
WBC: 6.4 10*3/uL (ref 4.0–10.5)
nRBC: 0 % (ref 0.0–0.2)

## 2023-12-25 MED ORDER — GADOBUTROL 1 MMOL/ML IV SOLN
9.0000 mL | Freq: Once | INTRAVENOUS | Status: AC | PRN
  Administered 2023-12-25: 9 mL via INTRAVENOUS

## 2024-01-26 LAB — LIPID PANEL
Chol/HDL Ratio: 2.8 {ratio} (ref 0.0–4.4)
Cholesterol, Total: 133 mg/dL (ref 100–199)
HDL: 48 mg/dL (ref >39)
LDL Chol Calc (NIH): 62 mg/dL (ref 0–99)
Triglycerides: 132 mg/dL (ref 0–149)
VLDL Cholesterol Cal: 23 mg/dL (ref 5–40)

## 2024-01-26 LAB — HEPATIC FUNCTION PANEL
ALT: 20 [IU]/L (ref 0–32)
AST: 30 [IU]/L (ref 0–40)
Albumin: 4.3 g/dL (ref 3.8–4.8)
Alkaline Phosphatase: 48 [IU]/L (ref 44–121)
Bilirubin Total: 0.5 mg/dL (ref 0.0–1.2)
Bilirubin, Direct: 0.19 mg/dL (ref 0.00–0.40)
Total Protein: 7.1 g/dL (ref 6.0–8.5)

## 2024-01-27 LAB — LAB REPORT - SCANNED: EGFR: 94

## 2024-03-31 NOTE — Progress Notes (Unsigned)
 Cardiology Clinic Note   Patient Name: Renatta Yousefi Date of Encounter: 04/06/2024  Primary Care Provider:  Lorella Roles, MD Primary Cardiologist:  Euell Herrlich, MD  Patient Profile    Gargi Gallarzo 78 year old female presents to the clinic today for follow-up evaluation of her coronary artery calcification hyperlipidemia, and mitral valve insufficiency.   Past Medical History    Past Medical History:  Diagnosis Date   Arthritis    Hypercholesterolemia    Osteoporosis    Past Surgical History:  Procedure Laterality Date   CESAREAN SECTION     TOTAL KNEE ARTHROPLASTY Right 01/20/2019   Procedure: TOTAL KNEE ARTHROPLASTY;  Surgeon: Neil Balls, MD;  Location: [facility];  Service: Orthopedics;  Laterality: Right;    Allergies  Allergies  Allergen Reactions   Aspirin  Nausea Only   Oxycodone  Nausea And Vomiting   Sulfa Antibiotics Rash    History of Present Illness    Navayah Ericson has a PMH of mitral valve insufficiency, coronary artery calcification, hyperlipidemia, and diastolic dysfunction.  She was referred to cardiology for evaluation of her aorta and enlarged RV that were found on echocardiogram.  She was seen by Dr. Chancy Comber on 09/19/2023.  During that time she denied chest pain, shortness of breath, and palpitations.  She felt well overall.  She did note occasional episodes of drowsiness after meals.  She was taking a atorvastatin  10 mg daily and had been for years.  Echocardiogram was ordered and showed LVEF of 60 to 65%, G1 DD, trivial mitral valve regurgitation, mildly enlarged right ventricle, positive bubble study suggesting weakly small PFO.  She presented to the clinic 11/20/ 24 for follow-up evaluation and to review her echocardiogram.  She stated she continued to walk for 15 minutes, 3 times per day. She has been eating a low salt diet. We reviewed her Echo. She had he husband expressed understanding. I reviewed the recent lipid panel and LFTs. LDL cho was noted to be  90. She is compliant with her medications. I will add Zetia  to her medication regimen, plan lipids and lfts in 3 months and plan ROV in 6-9 monhts.   Cardiac MRI was ordered to evaluate RV and PFO.  No significant intra-atrial shunt was detected normal right ventricular size and function were noted.  LVEF was noted to be 65%.  No delayed myocardial enhancement, no myocardial edema  She presents to the clinic today for follow-up evaluation and states she continues to be very physically active walking 3 times a day for about 15 minutes.  She does have some pain in her left knee.  She is seeing orthopedics for this.  We reviewed her cardiac MRI.  She expressed understanding.  We also reviewed her most recent lipid panel.  She has had improvement in her LDL cholesterol.  I will continue her current medication regimen and plan follow-up in about 9 months.  Today she denies chest pain, shortness of breath, lower extremity edema, fatigue, palpitations, melena, hematuria, hemoptysis, diaphoresis, weakness, presyncope, syncope, orthopnea, and PND.     Home Medications    Prior to Admission medications   Medication Sig Start Date End Date Taking? Authorizing Provider  acetaminophen  (TYLENOL ) 325 MG tablet Take 325 mg by mouth every 6 (six) hours as needed. 05/01/22   [provider]  alendronate (FOSAMAX) 70 MG tablet Take 70 mg by mouth every Sunday. Take with a full glass of water  on an empty stomach.    [provider]  atorvastatin  (LIPITOR)  40 MG tablet Take 1 tablet (40 mg total) by mouth daily. 09/19/23   Acharya, Gayatri A, MD  diclofenac (VOLTAREN) 50 MG EC tablet Take 50 mg by mouth 2 (two) times daily. 09/04/23   [provider]  Glucosamine HCl (GLUCOSAMINE PO) Take 2 tablets by mouth daily.    [provider]  meclizine  (ANTIVERT ) 25 MG tablet Take 1 tablet (25 mg total) by mouth 3 (three) times daily as needed for dizziness. Patient not taking: Reported on  09/19/2023 02/15/22   Albertus Hughs, DO  meloxicam (MOBIC) 15 MG tablet Take 15 mg by mouth daily as needed for pain. Patient not taking: Reported on 09/19/2023    [provider]  Multiple Vitamin (MULTIVITAMIN) tablet Take 1 tablet by mouth daily.    [provider]  Propylene Glycol (SYSTANE COMPLETE OP) Place 1 drop into both eyes in the morning, at noon, and at bedtime. Patient not taking: Reported on 09/19/2023    [provider]  VITAMIN D PO Take 1 tablet by mouth daily.    [provider]    Family History    Family History  Problem Relation Age of Onset   Breast cancer Sister 43   Breast cancer Sister 15     Social History    Social History   Socioeconomic History   Marital status: Married    Spouse name: Not on file   Number of children: Not on file   Years of education: Not on file   Highest education level: Not on file  Occupational History   Not on file  Tobacco Use   Smoking status: Never   Smokeless tobacco: Never  Vaping Use   Vaping status: Never Used  Substance and Sexual Activity   Alcohol use: Never   Drug use: Never   Sexual activity: Not on file  Other Topics Concern   Not on file  Social History Narrative   Not on file   Social Drivers of Health   Financial Resource Strain: Not on file  Food Insecurity: Not on file  Transportation Needs: Not on file  Physical Activity: Not on file  Stress: Not on file  Social Connections: Not on file  Intimate Partner Violence: Not on file     Review of Systems    General:  No chills, fever, night sweats or weight changes.  Cardiovascular:  No chest pain, dyspnea on exertion, edema, orthopnea, palpitations, paroxysmal nocturnal dyspnea. Dermatological: No rash, lesions/masses Respiratory: No cough, dyspnea Urologic: No hematuria, dysuria Abdominal:   No nausea, vomiting, diarrhea, bright red blood per rectum, melena, or hematemesis Neurologic:  No visual changes,  wkns, changes in mental status. All other systems reviewed and are otherwise negative except as noted above.  Physical Exam    VS:  BP 128/70   Pulse 74   Ht 5\' 2"  (1.575 m)   Wt 142 lb (64.4 kg)   SpO2 94%   BMI 25.97 kg/m  , BMI Body mass index is 25.97 kg/m. GEN: Well nourished, well developed, in no acute distress. HEENT: normal. Neck: Supple, no JVD, carotid bruits, or masses. Cardiac: RRR, no murmurs, rubs, or gallops. No clubbing, cyanosis, edema.  Radials/DP/PT 2+ and equal bilaterally.  Respiratory:  Respirations regular and unlabored, clear to auscultation bilaterally. GI: Soft, nontender, nondistended, BS + x 4. MS: no deformity or atrophy. Skin: warm and dry, no rash. Neuro:  Strength and sensation are intact. Psych: Normal affect.  Accessory Clinical Findings  Recent Labs: 12/25/2023: Hemoglobin 13.1; Platelets 288 01/26/2024: ALT 20   Recent Lipid Panel    Component Value Date/Time   CHOL 133 01/26/2024 0900   TRIG 132 01/26/2024 0900   HDL 48 01/26/2024 0900   CHOLHDL 2.8 01/26/2024 0900   LDLCALC 62 01/26/2024 0900         ECG personally reviewed by me today- EKG Interpretation Date/Time:  Tuesday Apr 06 2024 10:35:34 EDT Ventricular Rate:  74 PR Interval:  150 QRS Duration:  84 QT Interval:  400 QTC Calculation: 444 R Axis:   29  Text Interpretation: Normal sinus rhythm Normal ECG When compared with ECG of 19-Sep-2023 08:03, No significant change was found Confirmed by Lawana Pray [phone]) on 04/06/2024 10:37:03 AM    Echocardiogram 10/17/2023  IMPRESSIONS     1. Left ventricular ejection fraction, by estimation, is 60 to 65%. The  left ventricle has normal function. The left ventricle has no regional  wall motion abnormalities. Left ventricular diastolic parameters are  consistent with Grade I diastolic  dysfunction (impaired relaxation).   2. Right ventricular systolic function is normal. The right ventricular  size is mildly  enlarged. There is normal pulmonary artery systolic  pressure. The estimated right ventricular systolic pressure is 33.2 mmHg.   3. The mitral valve is normal in structure. Trivial mitral valve  regurgitation. No evidence of mitral stenosis.   4. The aortic valve is tricuspid. There is mild calcification of the  aortic valve. Aortic valve regurgitation is trivial. No aortic stenosis is  present.   5. The inferior vena cava is normal in size with greater than 50%  respiratory variability, suggesting right atrial pressure of 3 mmHg.   6. Weakly positive bubble study suggesting small PFO.   7. Cysts noted in the liver, largest 4.5 cm in diameter.   Comparison(s): Prior Echo performed at outside facility (Image review  unavailable).   FINDINGS   Left Ventricle: Left ventricular ejection fraction, by estimation, is 60  to 65%. The left ventricle has normal function. The left ventricle has no  regional wall motion abnormalities. The left ventricular internal cavity  size was normal in size. There is   no left ventricular hypertrophy. Left ventricular diastolic parameters  are consistent with Grade I diastolic dysfunction (impaired relaxation).   Right Ventricle: The right ventricular size is mildly enlarged. No  increase in right ventricular wall thickness. Right ventricular systolic  function is normal. There is normal pulmonary artery systolic pressure.  The tricuspid regurgitant velocity is 2.75   m/s, and with an assumed right atrial pressure of 3 mmHg, the estimated  right ventricular systolic pressure is 33.2 mmHg.   Left Atrium: Left atrial size was normal in size.   Right Atrium: Right atrial size was normal in size.   Mitral Valve: The mitral valve is normal in structure. Trivial mitral  valve regurgitation. No evidence of mitral valve stenosis.   Tricuspid Valve: The tricuspid valve is normal in structure. Tricuspid  valve regurgitation is trivial.   Aortic Valve: The  aortic valve is tricuspid. There is mild calcification  of the aortic valve. Aortic valve regurgitation is trivial. No aortic  stenosis is present.   Pulmonic Valve: The pulmonic valve was normal in structure. Pulmonic valve  regurgitation is not visualized.   Aorta: The aortic root is normal in size and structure.   Venous: The inferior vena cava is normal in size with greater than 50%  respiratory variability, suggesting right atrial pressure of  3 mmHg.   IAS/Shunts: Weakly positive bubble study suggesting small PFO. Agitated  saline contrast was given intravenously to evaluate for intracardiac  shunting.   Cardiac MRI 12/25/2023  IMPRESSION: 1.  No significant interatrial shunt detected.  Qp/Qs 0.94.   2.  Normal right ventricular chamber size and function, RVEF 56%.   3. Normal left ventricular chamber size and systolic function, LVEF 65%.   4. No delayed myocardial enhancement, no myocardial edema. No evidence of scar, fibrosis, infarct, inflammation or infiltrative process.   5.  Multiple hepatic cysts seen.     Electronically Signed   By: Grady Lawman M.D.   On: 12/25/2023 16:45  Assessment & Plan   1. PFO, diastolic dysfunction-denies activity intolerance or DOE.  Follow-up echocardiogram showed normal LVEF, G1 DD, normal RV function, trivial mitral valve regurgitation, and positive bubble study weakly suggesting small PFO.  Cardiac MRI reassuring.  Details above. Maintain physical activity   Coronary artery disease-denies anginal type symptoms.  Coronary calcium  noted on imaging. Maintain physical activity Heart healthy low-sodium diet Continue atorvastatin   Hyperlipidemia-LDL 90 on 10/14/23. 01/26/2024: Cholesterol, Total 133; HDL 48; LDL Chol Calc (NIH) 62; Triglycerides 132 High-fiber diet Continue atorvastatin , ezetimibe  Maintain physical activity    Disposition: Follow-up with Dr. Chancy Comber or me in 6-9 months.   Chet Cota. Myeisha Kruser NP-C      04/06/2024, 10:59 AM [facility] [address] Office [phone] Fax [phone]    I spent 14  minutes examining this patient, reviewing medications, and using patient centered shared decision making involving her cardiac care.   I spent greater than 20 minutes reviewing her past medical history,  medications, and prior cardiac tests.

## 2024-04-05 ENCOUNTER — Encounter (HOSPITAL_BASED_OUTPATIENT_CLINIC_OR_DEPARTMENT_OTHER): Payer: Self-pay

## 2024-04-06 ENCOUNTER — Encounter: Payer: Self-pay | Admitting: General Practice

## 2024-04-06 ENCOUNTER — Ambulatory Visit: Payer: Medicare HMO | Attending: General Practice | Admitting: General Practice

## 2024-04-06 VITALS — BP 128/70 | HR 74 | Ht 62.0 in | Wt 142.0 lb

## 2024-04-06 DIAGNOSIS — I519 Heart disease, unspecified: Secondary | ICD-10-CM | POA: Diagnosis not present

## 2024-04-06 DIAGNOSIS — I2510 Atherosclerotic heart disease of native coronary artery without angina pectoris: Secondary | ICD-10-CM

## 2024-04-06 DIAGNOSIS — E785 Hyperlipidemia, unspecified: Secondary | ICD-10-CM

## 2024-04-06 DIAGNOSIS — I5189 Other ill-defined heart diseases: Secondary | ICD-10-CM | POA: Diagnosis not present

## 2024-04-06 DIAGNOSIS — Q2112 Patent foramen ovale: Secondary | ICD-10-CM

## 2024-04-06 NOTE — Patient Instructions (Addendum)
 Medication Instructions:  No medication changes were made during today's visit.  *If you need a refill on your cardiac medications before your next appointment, please call your pharmacy*   Lab Work: No labs were ordered during today's visit.  If you have labs (blood work) drawn today and your tests are completely normal, you will receive your results only by: MyChart Message (if you have MyChart) OR A paper copy in the mail If you have any lab test that is abnormal or we need to change your treatment, we will call you to review the results.   Testing/Procedures: No procedures were ordered during today's visit.    Follow-Up: At [facility], you and your health needs are our priority.  As part of our continuing mission to provide you with exceptional heart care, we have created designated Provider Care Teams.  These Care Teams include your primary Cardiologist (physician) and Advanced Practice Providers (APPs -  Physician Assistants and Nurse Practitioners) who all work together to provide you with the care you need, when you need it.  We recommend signing up for the patient portal called "MyChart".  Sign up information is provided on this After Visit Summary.  MyChart is used to connect with patients for Virtual Visits (Telemedicine).  Patients are able to view lab/test results, encounter notes, upcoming appointments, etc.  Non-urgent messages can be sent to your provider as well.   To learn more about what you can do with MyChart, go to [email or web address].    Your next appointment:   9 month(s)  Provider:   Gayatri A Acharya, MD    A letter will be mailed to you as a reminder to call the office for your next follow up appointment.   Other Instructions Thank you for choosing [facility]!

## 2024-04-28 ENCOUNTER — Other Ambulatory Visit (HOSPITAL_BASED_OUTPATIENT_CLINIC_OR_DEPARTMENT_OTHER): Payer: Self-pay | Admitting: Family Medicine

## 2024-04-28 DIAGNOSIS — Z1231 Encounter for screening mammogram for malignant neoplasm of breast: Secondary | ICD-10-CM

## 2024-05-03 ENCOUNTER — Ambulatory Visit (HOSPITAL_BASED_OUTPATIENT_CLINIC_OR_DEPARTMENT_OTHER)
Admission: RE | Admit: 2024-05-03 | Discharge: 2024-05-03 | Disposition: A | Discharge status: Home / Self Care | Source: Ambulatory Visit | Attending: Family Medicine | Admitting: Family Medicine

## 2024-05-03 ENCOUNTER — Encounter (HOSPITAL_BASED_OUTPATIENT_CLINIC_OR_DEPARTMENT_OTHER): Payer: Self-pay

## 2024-05-03 DIAGNOSIS — Z1231 Encounter for screening mammogram for malignant neoplasm of breast: Secondary | ICD-10-CM | POA: Insufficient documentation

## 2024-07-21 ENCOUNTER — Other Ambulatory Visit (HOSPITAL_BASED_OUTPATIENT_CLINIC_OR_DEPARTMENT_OTHER): Payer: Self-pay | Admitting: Family Medicine

## 2024-07-21 DIAGNOSIS — Z780 Asymptomatic menopausal state: Secondary | ICD-10-CM

## 2024-08-30 ENCOUNTER — Ambulatory Visit (HOSPITAL_BASED_OUTPATIENT_CLINIC_OR_DEPARTMENT_OTHER)
Admission: RE | Admit: 2024-08-30 | Discharge: 2024-08-30 | Disposition: A | Discharge status: Home / Self Care | Source: Ambulatory Visit | Attending: Family Medicine | Admitting: Family Medicine

## 2024-08-30 DIAGNOSIS — Z780 Asymptomatic menopausal state: Secondary | ICD-10-CM | POA: Diagnosis present

## 2024-11-19 ENCOUNTER — Encounter: Payer: Self-pay | Admitting: Internal Medicine

## 2024-11-22 ENCOUNTER — Telehealth: Payer: Self-pay | Admitting: Internal Medicine

## 2024-11-22 DIAGNOSIS — I2510 Atherosclerotic heart disease of native coronary artery without angina pectoris: Secondary | ICD-10-CM

## 2024-11-22 DIAGNOSIS — E785 Hyperlipidemia, unspecified: Secondary | ICD-10-CM

## 2024-11-22 NOTE — Telephone Encounter (Signed)
 Patient would just like to make sure she has active lab orders in preparation of her 01/14/25 appt.

## 2024-11-22 NOTE — Telephone Encounter (Signed)
 I spoke with patient.  She has appointment with Dr Loni on 01/14/25 and is asking if she needs lab work prior to this appointment.  She is not scheduled to have lab work done by any other provider any time soon I told patient we would send message to Dr Loni and then call her with recommendations.

## 2024-11-23 NOTE — Telephone Encounter (Signed)
 Left detailed VM and sent [facility] message; lab order entered.

## 2025-01-06 LAB — HEPATIC FUNCTION PANEL
ALT: 15 [IU]/L (ref 0–32)
AST: 24 [IU]/L (ref 0–40)
Albumin: 4.5 g/dL (ref 3.8–4.8)
Alkaline Phosphatase: 50 [IU]/L (ref 49–135)
Bilirubin Total: 0.6 mg/dL (ref 0.0–1.2)
Bilirubin, Direct: 0.2 mg/dL (ref 0.00–0.40)
Total Protein: 7.4 g/dL (ref 6.0–8.5)

## 2025-01-06 LAB — LIPID PANEL
Chol/HDL Ratio: 2.1 ratio (ref 0.0–4.4)
Cholesterol, Total: 116 mg/dL (ref 100–199)
HDL: 54 mg/dL
LDL Chol Calc (NIH): 40 mg/dL (ref 0–99)
Triglycerides: 128 mg/dL (ref 0–149)
VLDL Cholesterol Cal: 22 mg/dL (ref 5–40)

## 2025-01-14 ENCOUNTER — Ambulatory Visit: Admitting: Internal Medicine
